# Patient Record
Sex: Female | Born: 1985 | Race: Black or African American | Hispanic: No | Marital: Married | Smoking: Never smoker
Health system: Southern US, Community
[De-identification: ages and names within clinical notes are randomized; demographics above are authoritative.]

## PROBLEM LIST (undated history)

## (undated) DIAGNOSIS — K219 Gastro-esophageal reflux disease without esophagitis: Secondary | ICD-10-CM

## (undated) DIAGNOSIS — Z800 Family history of malignant neoplasm of digestive organs: Secondary | ICD-10-CM

## (undated) DIAGNOSIS — Z1509 Genetic susceptibility to other malignant neoplasm: Secondary | ICD-10-CM

## (undated) DIAGNOSIS — T7840XA Allergy, unspecified, initial encounter: Secondary | ICD-10-CM

## (undated) DIAGNOSIS — J45909 Unspecified asthma, uncomplicated: Secondary | ICD-10-CM

## (undated) DIAGNOSIS — R519 Headache, unspecified: Secondary | ICD-10-CM

## (undated) DIAGNOSIS — Z9189 Other specified personal risk factors, not elsewhere classified: Secondary | ICD-10-CM

## (undated) HISTORY — DX: Allergy, unspecified, initial encounter: T78.40XA

## (undated) HISTORY — DX: Genetic susceptibility to other malignant neoplasm: Z15.09

## (undated) HISTORY — DX: Family history of malignant neoplasm of digestive organs: Z80.0

## (undated) HISTORY — DX: Unspecified asthma, uncomplicated: J45.909

## (undated) HISTORY — PX: DILATION AND CURETTAGE OF UTERUS: SHX78

## (undated) HISTORY — DX: Gastro-esophageal reflux disease without esophagitis: K21.9

---
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
## 2004-08-01 ENCOUNTER — Inpatient Hospital Stay (HOSPITAL_COMMUNITY): Admission: AD | Admit: 2004-08-01 | Discharge: 2004-08-01 | Payer: Self-pay | Admitting: Obstetrics

## 2004-08-03 ENCOUNTER — Encounter (INDEPENDENT_AMBULATORY_CARE_PROVIDER_SITE_OTHER): Payer: Self-pay | Admitting: Specialist

## 2004-08-03 ENCOUNTER — Ambulatory Visit (HOSPITAL_COMMUNITY): Admission: RE | Admit: 2004-08-03 | Discharge: 2004-08-03 | Payer: Self-pay | Admitting: Obstetrics

## 2004-08-12 ENCOUNTER — Inpatient Hospital Stay (HOSPITAL_COMMUNITY): Admission: AD | Admit: 2004-08-12 | Discharge: 2004-08-12 | Payer: Self-pay | Admitting: Obstetrics

## 2004-11-07 ENCOUNTER — Emergency Department (HOSPITAL_COMMUNITY): Admission: EM | Admit: 2004-11-07 | Discharge: 2004-11-07 | Payer: Self-pay | Admitting: Emergency Medicine

## 2005-04-12 ENCOUNTER — Emergency Department (HOSPITAL_COMMUNITY): Admission: EM | Admit: 2005-04-12 | Discharge: 2005-04-12 | Payer: Self-pay | Admitting: Emergency Medicine

## 2005-08-09 ENCOUNTER — Inpatient Hospital Stay (HOSPITAL_COMMUNITY): Admission: AD | Admit: 2005-08-09 | Discharge: 2005-08-09 | Payer: Self-pay | Admitting: Obstetrics

## 2006-01-29 ENCOUNTER — Emergency Department (HOSPITAL_COMMUNITY): Admission: EM | Admit: 2006-01-29 | Discharge: 2006-01-29 | Payer: Self-pay | Admitting: *Deleted

## 2006-02-21 ENCOUNTER — Emergency Department (HOSPITAL_COMMUNITY): Admission: EM | Admit: 2006-02-21 | Discharge: 2006-02-22 | Payer: Self-pay | Admitting: Emergency Medicine

## 2006-03-02 ENCOUNTER — Ambulatory Visit (HOSPITAL_COMMUNITY): Admission: RE | Admit: 2006-03-02 | Discharge: 2006-03-02 | Payer: Self-pay | Admitting: Obstetrics

## 2007-07-05 ENCOUNTER — Inpatient Hospital Stay (HOSPITAL_COMMUNITY): Admission: AD | Admit: 2007-07-05 | Discharge: 2007-07-05 | Payer: Self-pay | Admitting: Obstetrics and Gynecology

## 2009-10-07 ENCOUNTER — Emergency Department (HOSPITAL_COMMUNITY): Admission: EM | Admit: 2009-10-07 | Discharge: 2009-10-08 | Payer: Self-pay | Admitting: Emergency Medicine

## 2010-09-19 ENCOUNTER — Emergency Department (HOSPITAL_COMMUNITY)
Admission: EM | Admit: 2010-09-19 | Discharge: 2010-09-19 | Payer: Self-pay | Source: Home / Self Care | Admitting: Emergency Medicine

## 2010-09-21 LAB — POCT I-STAT, CHEM 8
BUN: 13 mg/dL (ref 6–23)
Calcium, Ion: 1.18 mmol/L (ref 1.12–1.32)
Chloride: 105 meq/L (ref 96–112)
Creatinine, Ser: 0.9 mg/dL (ref 0.4–1.2)
Glucose, Bld: 78 mg/dL (ref 70–99)
HCT: 42 % (ref 36.0–46.0)
Hemoglobin: 14.3 g/dL (ref 12.0–15.0)
Potassium: 3.6 meq/L (ref 3.5–5.1)
Sodium: 139 meq/L (ref 135–145)
TCO2: 24 mmol/L (ref 0–100)

## 2010-09-21 LAB — POCT PREGNANCY, URINE: Preg Test, Ur: NEGATIVE

## 2010-09-21 LAB — URINALYSIS, ROUTINE W REFLEX MICROSCOPIC
Hgb urine dipstick: NEGATIVE
Ketones, ur: 40 mg/dL — AB
Nitrite: NEGATIVE
Protein, ur: NEGATIVE mg/dL
Specific Gravity, Urine: 1.031 — ABNORMAL HIGH (ref 1.005–1.030)
Urine Glucose, Fasting: NEGATIVE mg/dL
Urobilinogen, UA: 1 mg/dL (ref 0.0–1.0)
pH: 5.5 (ref 5.0–8.0)

## 2010-11-23 LAB — RAPID STREP SCREEN (MED CTR MEBANE ONLY): Streptococcus, Group A Screen (Direct): NEGATIVE

## 2011-01-20 NOTE — Op Note (Signed)
NAME:  Amy Reed, Amy Reed              ACCOUNT NO.:  [id number]   MEDICAL RECORD NO.:  [id number]          PATIENT TYPE:  AMB   LOCATION:  SDC                           FACILITY:  [facility]   PHYSICIAN:  Kathreen Cosier, M.D.DATE OF BIRTH:  [birth year]   DATE OF PROCEDURE:  08/03/2004  DATE OF DISCHARGE:                                 OPERATIVE REPORT   PREOPERATIVE DIAGNOSIS:  Intrauterine fetal demise at 7.5 weeks.   PROCEDURE:  Dilation and evacuation.   Using MAC, with the patient in the lithotomy position, perineum and vagina  prepped and draped.  Bladder emptied with a straight catheter.  Bimanual  exam revealed uterus to be 8 weeks size.  Weighted speculum placed in the  vagina.  Anterior lip of the cervix was grasped with a tenaculum.  Cervix  was injected with 8 cc of 1% Xylocaine at 3 and 9 o'clock.  Cervix dilated  to a #27 Shawnie Pons.  A #7 suction was used to aspirate the uterine contents  until the cavity was clean.  The patient tolerated the procedure well and  was taken to the recovery room in good condition.      BAM/MEDQ  D:  08/03/2004  T:  08/03/2004  Job:  [id number]

## 2011-01-20 NOTE — Op Note (Signed)
NAME:  Amy Reed, Amy Reed              ACCOUNT NO.:  [id number]   MEDICAL RECORD NO.:  [id number]          PATIENT TYPE:  AMB   LOCATION:  SDC                           FACILITY:  [facility]   PHYSICIAN:  Charles A. Clearance Coots, M.D.DATE OF BIRTH:  19-Jul-1986   DATE OF PROCEDURE:  03/02/2006  DATE OF DISCHARGE:                                 OPERATIVE REPORT   PREOPERATIVE DIAGNOSIS:  Genital warts.   POSTOPERATIVE DIAGNOSIS:  Genital warts.   PROCEDURE:  Laser vaporization of genital warts.   SURGEON:  Charles A. Clearance Coots, M.D.   ANESTHESIA:  General.   ESTIMATED BLOOD LOSS:  Negligible.   COMPLICATIONS:  None.   SPECIMEN:  None.   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   OPERATION:  The patient was brought to the operating room and after  satisfactory general endotracheal anesthesia, the patient was brought up in  stirrups and the vagina was prepped and draped in usual sterile fashion.  The areas of genital warts were then vaporized at 12-15 watts laser power  until the epidermis was reached and then the base of the vaporized areas  were coagulated at a power of 7 watts to obtain good hemostasis.  There was  no active bleeding at the conclusion of procedure.  All instruments were  retired.  The patient tolerated the procedure well and was transported to  the recovery room in satisfactory condition.      Charles A. Clearance Coots, M.D.  Electronically Signed     CAH/MEDQ  D:  03/02/2006  T:  03/02/2006  Job:  [id number]   cc:   Roseanna Rainbow, M.D.  Fax: [phone]

## 2011-06-14 LAB — URINALYSIS, ROUTINE W REFLEX MICROSCOPIC
Bilirubin Urine: NEGATIVE
Glucose, UA: NEGATIVE
Hgb urine dipstick: NEGATIVE
Ketones, ur: NEGATIVE
Nitrite: NEGATIVE
Protein, ur: NEGATIVE
Specific Gravity, Urine: 1.02
Urobilinogen, UA: 1
pH: 8.5 — ABNORMAL HIGH

## 2011-06-14 LAB — GC/CHLAMYDIA PROBE AMP, GENITAL
Chlamydia, DNA Probe: NEGATIVE
GC Probe Amp, Genital: NEGATIVE

## 2011-06-14 LAB — WET PREP, GENITAL
Clue Cells Wet Prep HPF POC: NONE SEEN
Trich, Wet Prep: NONE SEEN
Yeast Wet Prep HPF POC: NONE SEEN

## 2012-01-09 ENCOUNTER — Encounter (HOSPITAL_COMMUNITY): Payer: Self-pay | Admitting: *Deleted

## 2012-01-09 ENCOUNTER — Emergency Department (HOSPITAL_COMMUNITY)
Admission: EM | Admit: 2012-01-09 | Discharge: 2012-01-10 | Disposition: A | Discharge status: Home / Self Care | Attending: Emergency Medicine | Admitting: Emergency Medicine

## 2012-01-09 DIAGNOSIS — E860 Dehydration: Secondary | ICD-10-CM

## 2012-01-09 DIAGNOSIS — R51 Headache: Secondary | ICD-10-CM | POA: Insufficient documentation

## 2012-01-09 DIAGNOSIS — R112 Nausea with vomiting, unspecified: Secondary | ICD-10-CM

## 2012-01-09 DIAGNOSIS — R197 Diarrhea, unspecified: Secondary | ICD-10-CM | POA: Insufficient documentation

## 2012-01-09 DIAGNOSIS — Z7982 Long term (current) use of aspirin: Secondary | ICD-10-CM | POA: Insufficient documentation

## 2012-01-09 DIAGNOSIS — Z79899 Other long term (current) drug therapy: Secondary | ICD-10-CM | POA: Insufficient documentation

## 2012-01-09 LAB — POCT I-STAT, CHEM 8
BUN: 19 mg/dL (ref 6–23)
Calcium, Ion: 1.18 mmol/L (ref 1.12–1.32)
Chloride: 106 mEq/L (ref 96–112)
Creatinine, Ser: 0.8 mg/dL (ref 0.50–1.10)
Glucose, Bld: 86 mg/dL (ref 70–99)
HCT: 49 % — ABNORMAL HIGH (ref 36.0–46.0)
Hemoglobin: 16.7 g/dL — ABNORMAL HIGH (ref 12.0–15.0)
Potassium: 3.7 mEq/L (ref 3.5–5.1)
Sodium: 143 mEq/L (ref 135–145)
TCO2: 26 mmol/L (ref 0–100)

## 2012-01-09 LAB — HCG, SERUM, QUALITATIVE: Preg, Serum: NEGATIVE

## 2012-01-09 LAB — PREGNANCY, URINE: Preg Test, Ur: NEGATIVE

## 2012-01-09 MED ORDER — SODIUM CHLORIDE 0.9 % IV BOLUS (SEPSIS)
1000.0000 mL | Freq: Once | INTRAVENOUS | Status: AC
  Administered 2012-01-09: 1000 mL via INTRAVENOUS

## 2012-01-09 MED ORDER — ONDANSETRON HCL 4 MG/2ML IJ SOLN
4.0000 mg | Freq: Once | INTRAMUSCULAR | Status: AC
  Administered 2012-01-09: 4 mg via INTRAVENOUS
  Filled 2012-01-09: qty 2

## 2012-01-09 MED ORDER — PROMETHAZINE HCL 25 MG PO TABS: 25.0000 mg | ORAL_TABLET | Freq: Four times a day (QID) | ORAL | Status: DC | PRN

## 2012-01-09 NOTE — ED Provider Notes (Signed)
History     CSN: [id number]  Arrival date & time 01/09/12  2023   First MD Initiated Contact with Patient 01/09/12 2235      Chief Complaint  Patient presents with  . N/V/D     (Consider location/radiation/quality/duration/timing/severity/associated sxs/prior treatment) The history is provided by the patient.   the patient reports developing nausea vomiting and diarrhea earlier today.  She reports mild headache.  She has no neck stiffness.  She denies abdominal pain.  She has no chest pain shortness of breath.  She denies melena or hematochezia.  She has no fevers or chills.  She denies dysuria.  She's had no recent sick contacts.  Nothing worsens her symptoms.  Nothing improves her symptoms.  Her symptoms are constant.  History reviewed. No pertinent past medical history.  History reviewed. No pertinent past surgical history.  History reviewed. No pertinent family history.  History  Substance Use Topics  . Smoking status: Not on file  . Smokeless tobacco: Not on file  . Alcohol Use: Not on file    OB History    Grav Para Term Preterm Abortions TAB SAB Ect Mult Living                  Review of Systems  All other systems reviewed and are negative.    Allergies  Review of patient's allergies indicates no known allergies.  Home Medications   Current Outpatient Rx  Name Route Sig Dispense Refill  . ASPIRIN EC 325 MG PO TBEC Oral Take 325 mg by mouth daily.    Marland Kitchen BISMUTH SUBSALICYLATE 262 MG PO CHEW Oral Chew 524 mg by mouth as needed. Stomach pain    . PROMETHAZINE HCL 25 MG PO TABS Oral Take 1 tablet (25 mg total) by mouth every 6 (six) hours as needed for nausea. 15 tablet 0    BP 129/67  Pulse 83  Temp(Src) 98.5 F (36.9 C) (Oral)  Resp 20  SpO2 100%  Physical Exam  Nursing note and vitals reviewed. Constitutional: She is oriented to person, place, and time. She appears well-developed and well-nourished. No distress.  HENT:  Head: Normocephalic and  atraumatic.       Mucous membranes dry  Eyes: EOM are normal.  Neck: Normal range of motion.  Cardiovascular: Normal rate, regular rhythm and normal heart sounds.   Pulmonary/Chest: Effort normal and breath sounds normal.  Abdominal: Soft. She exhibits no distension. There is no tenderness.  Musculoskeletal: Normal range of motion.  Neurological: She is alert and oriented to person, place, and time.  Skin: Skin is warm and dry.  Psychiatric: She has a normal mood and affect. Judgment normal.    ED Course  Procedures (including critical care time)  Labs Reviewed  POCT I-STAT, CHEM 8 - Abnormal; Notable for the following:    Hemoglobin 16.7 (*)    HCT 49.0 (*)    All other components within normal limits  PREGNANCY, URINE  HCG, SERUM, QUALITATIVE   No results found.   1. Nausea vomiting and diarrhea   2. Dehydration       MDM  Isolated nausea vomiting and diarrhea with early dehydration.  IV fluids and Zofran given.  The patient feels much better at this time.  Her abdomen is benign.  Discharge home in good condition.        Lyanne Co, MD 01/09/12 [phone]

## 2012-01-09 NOTE — Discharge Instructions (Signed)
Dehydration, Adult Dehydration is when you lose more fluids from the body than you take in. Vital organs like the kidneys, brain, and heart cannot function without a proper amount of fluids and salt. Any loss of fluids from the body can cause dehydration.  CAUSES   Vomiting.   Diarrhea.   Excessive sweating.   Excessive urine output.   Fever.  SYMPTOMS  Mild dehydration  Thirst.   Dry lips.   Slightly dry mouth.  Moderate dehydration  Very dry mouth.   Sunken eyes.   Skin does not bounce back quickly when lightly pinched and released.   Dark urine and decreased urine production.   Decreased tear production.   Headache.  Severe dehydration  Very dry mouth.   Extreme thirst.   Rapid, weak pulse (more than 100 beats per minute at rest).   Cold hands and feet.   Not able to sweat in spite of heat and temperature.   Rapid breathing.   Blue lips.   Confusion and lethargy.   Difficulty being awakened.   Minimal urine production.   No tears.  DIAGNOSIS  Your caregiver will diagnose dehydration based on your symptoms and your exam. Blood and urine tests will help confirm the diagnosis. The diagnostic evaluation should also identify the cause of dehydration. TREATMENT  Treatment of mild or moderate dehydration can often be done at home by increasing the amount of fluids that you drink. It is best to drink small amounts of fluid more often. Drinking too much at one time can make vomiting worse. Refer to the home care instructions below. Severe dehydration needs to be treated at the hospital where you will probably be given intravenous (IV) fluids that contain water and electrolytes. HOME CARE INSTRUCTIONS   Ask your caregiver about specific rehydration instructions.   Drink enough fluids to keep your urine clear or pale yellow.   Drink small amounts frequently if you have nausea and vomiting.   Eat as you normally do.   Avoid:   Foods or drinks high in  sugar.   Carbonated drinks.   Juice.   Extremely hot or cold fluids.   Drinks with caffeine.   Fatty, greasy foods.   Alcohol.   Tobacco.   Overeating.   Gelatin desserts.   Wash your hands well to avoid spreading bacteria and viruses.   Only take over-the-counter or prescription medicines for pain, discomfort, or fever as directed by your caregiver.   Ask your caregiver if you should continue all prescribed and over-the-counter medicines.   Keep all follow-up appointments with your caregiver.  SEEK MEDICAL CARE IF:  You have abdominal pain and it increases or stays in one area (localizes).   You have a rash, stiff neck, or severe headache.   You are irritable, sleepy, or difficult to awaken.   You are weak, dizzy, or extremely thirsty.  SEEK IMMEDIATE MEDICAL CARE IF:   You are unable to keep fluids down or you get worse despite treatment.   You have frequent episodes of vomiting or diarrhea.   You have blood or green matter (bile) in your vomit.   You have blood in your stool or your stool looks black and tarry.   You have not urinated in 6 to 8 hours, or you have only urinated a small amount of very dark urine.   You have a fever.   You faint.  MAKE SURE YOU:   Understand these instructions.   Will watch your condition.     Will get help right away if you are not doing well or get worse.  Document Released: 08/21/2005 Document Revised: 08/10/2011 Document Reviewed: 04/10/2011 [employer] Patient Information 2012 [employer]. 

## 2012-01-09 NOTE — ED Notes (Signed)
Pt in c/o n/v/d since earlier today and headache

## 2012-06-30 ENCOUNTER — Ambulatory Visit (INDEPENDENT_AMBULATORY_CARE_PROVIDER_SITE_OTHER): Admitting: Physician Assistant

## 2012-06-30 VITALS — BP 122/80 | HR 103 | Temp 100.2°F | Resp 16 | Ht 67.0 in | Wt 112.0 lb

## 2012-06-30 DIAGNOSIS — R509 Fever, unspecified: Secondary | ICD-10-CM

## 2012-06-30 DIAGNOSIS — J029 Acute pharyngitis, unspecified: Secondary | ICD-10-CM

## 2012-06-30 LAB — POCT CBC
Granulocyte percent: 79.9 %G (ref 37–80)
HCT, POC: 43.9 % (ref 37.7–47.9)
Hemoglobin: 13.6 g/dL (ref 12.2–16.2)
Lymph, poc: 1.7 (ref 0.6–3.4)
MCH, POC: 31.1 pg (ref 27–31.2)
MCHC: 31 g/dL — AB (ref 31.8–35.4)
MCV: 100.4 fL — AB (ref 80–97)
MID (cbc): 0.7 (ref 0–0.9)
MPV: 10.1 fL (ref 0–99.8)
POC Granulocyte: 9.3 — AB (ref 2–6.9)
POC LYMPH PERCENT: 14.3 %L (ref 10–50)
POC MID %: 5.8 %M (ref 0–12)
Platelet Count, POC: 175 10*3/uL (ref 142–424)
RBC: 4.37 M/uL (ref 4.04–5.48)
RDW, POC: 13.2 %
WBC: 11.6 10*3/uL — AB (ref 4.6–10.2)

## 2012-06-30 LAB — POCT INFLUENZA A/B
Influenza A, POC: NEGATIVE
Influenza B, POC: NEGATIVE

## 2012-06-30 MED ORDER — AMOXICILLIN 875 MG PO TABS: 875.0000 mg | ORAL_TABLET | Freq: Two times a day (BID) | ORAL | Status: DC

## 2012-06-30 MED ORDER — HYDROCODONE-HOMATROPINE 5-1.5 MG/5ML PO SYRP: 5.0000 mL | ORAL_SOLUTION | Freq: Three times a day (TID) | ORAL | Status: DC | PRN

## 2012-06-30 NOTE — Progress Notes (Signed)
  Subjective:    Patient ID: Amy Reed, female    DOB: 25-Aug-1986, 26 y.o.   MRN: [id number]  HPI 26 year old female presents with acute onset of sore throat, fever, chills, body aches, nasal congestion, and dry cough.  No know ill contacts.  Has been taking tylenol prn fever. Denies abdominal pain, nausea, vomiting, or headache.  She is an otherwise healthy female.     Review of Systems  Constitutional: Positive for fever and chills.  HENT: Positive for congestion, sore throat and rhinorrhea. Negative for ear pain.   Respiratory: Positive for cough. Negative for shortness of breath and wheezing.   Gastrointestinal: Negative for nausea, vomiting and abdominal pain.  All other systems reviewed and are negative.       Objective:   Physical Exam  Constitutional: She is oriented to person, place, and time. She appears well-developed and well-nourished.  HENT:  Head: Normocephalic and atraumatic.  Right Ear: External ear normal.  Left Ear: External ear normal.  Eyes: Conjunctivae normal are normal.  Neck: Normal range of motion.  Cardiovascular: Normal rate, regular rhythm and normal heart sounds.   Pulmonary/Chest: Effort normal and breath sounds normal.  Lymphadenopathy:    She has no cervical adenopathy.  Neurological: She is alert and oriented to person, place, and time.  Psychiatric: She has a normal mood and affect. Her behavior is normal. Judgment and thought content normal.     Results for orders placed in visit on 06/30/12  POCT INFLUENZA A/B      Component Value Range   Influenza A, POC Negative     Influenza B, POC Negative    POCT CBC      Component Value Range   WBC 11.6 (*) 4.6 - 10.2 K/uL   Lymph, poc 1.7  0.6 - 3.4   POC LYMPH PERCENT 14.3  10 - 50 %L   MID (cbc) 0.7  0 - 0.9   POC MID % 5.8  0 - 12 %M   POC Granulocyte 9.3 (*) 2 - 6.9   Granulocyte percent 79.9  37 - 80 %G   RBC 4.37  4.04 - 5.48 M/uL   Hemoglobin 13.6  12.2 - 16.2 g/dL   HCT, POC  40.9  81.1 - 47.9 %   MCV 100.4 (*) 80 - 97 fL   MCH, POC 31.1  27 - 31.2 pg   MCHC 31.0 (*) 31.8 - 35.4 g/dL   RDW, POC 91.4     Platelet Count, POC 175  142 - 424 K/uL   MPV 10.1  0 - 99.8 fL        Assessment & Plan:   1. Fever  POCT Influenza A/B, POCT CBC  2. Acute pharyngitis    Amoxicillin 875 mg bid x 10 days Continue ibuprofen or tylenol as needed for fever Hycodan prn cough Follow up if symptoms worsen or fail to improve.

## 2012-11-27 ENCOUNTER — Emergency Department (HOSPITAL_COMMUNITY)
Admission: EM | Admit: 2012-11-27 | Discharge: 2012-11-28 | Disposition: A | Discharge status: Home / Self Care | Attending: Emergency Medicine | Admitting: Emergency Medicine

## 2012-11-27 ENCOUNTER — Emergency Department (HOSPITAL_COMMUNITY)

## 2012-11-27 ENCOUNTER — Encounter (HOSPITAL_COMMUNITY): Payer: Self-pay | Admitting: Family Medicine

## 2012-11-27 DIAGNOSIS — R0602 Shortness of breath: Secondary | ICD-10-CM | POA: Insufficient documentation

## 2012-11-27 DIAGNOSIS — R0789 Other chest pain: Secondary | ICD-10-CM | POA: Insufficient documentation

## 2012-11-27 LAB — URINE MICROSCOPIC-ADD ON

## 2012-11-27 LAB — CBC WITH DIFFERENTIAL/PLATELET
Basophils Absolute: 0 10*3/uL (ref 0.0–0.1)
Basophils Relative: 0 % (ref 0–1)
Eosinophils Absolute: 0 10*3/uL (ref 0.0–0.7)
Eosinophils Relative: 1 % (ref 0–5)
HCT: 39.1 % (ref 36.0–46.0)
Hemoglobin: 13.5 g/dL (ref 12.0–15.0)
Lymphocytes Relative: 49 % — ABNORMAL HIGH (ref 12–46)
Lymphs Abs: 2.9 10*3/uL (ref 0.7–4.0)
MCH: 32.5 pg (ref 26.0–34.0)
MCHC: 34.5 g/dL (ref 30.0–36.0)
MCV: 94.2 fL (ref 78.0–100.0)
Monocytes Absolute: 0.4 10*3/uL (ref 0.1–1.0)
Monocytes Relative: 6 % (ref 3–12)
Neutro Abs: 2.7 10*3/uL (ref 1.7–7.7)
Neutrophils Relative %: 44 % (ref 43–77)
Platelets: 177 10*3/uL (ref 150–400)
RBC: 4.15 MIL/uL (ref 3.87–5.11)
RDW: 12.1 % (ref 11.5–15.5)
WBC: 6 10*3/uL (ref 4.0–10.5)

## 2012-11-27 LAB — URINALYSIS, ROUTINE W REFLEX MICROSCOPIC
Bilirubin Urine: NEGATIVE
Glucose, UA: NEGATIVE mg/dL
Hgb urine dipstick: NEGATIVE
Ketones, ur: NEGATIVE mg/dL
Nitrite: NEGATIVE
Protein, ur: NEGATIVE mg/dL
Specific Gravity, Urine: 1.024 (ref 1.005–1.030)
Urobilinogen, UA: 1 mg/dL (ref 0.0–1.0)
pH: 6.5 (ref 5.0–8.0)

## 2012-11-27 MED ORDER — KETOROLAC TROMETHAMINE 60 MG/2ML IM SOLN
60.0000 mg | Freq: Once | INTRAMUSCULAR | Status: DC
  Filled 2012-11-27: qty 2

## 2012-11-27 NOTE — ED Notes (Signed)
Patient states that she was sitting in a car and she experienced a tightness under her left breast that lasted for approx 10 mins. States that since that time she has soreness under her left breast and reports that she has pain with deep inspiration.

## 2012-11-27 NOTE — ED Provider Notes (Signed)
History     CSN: [id number]  Arrival date & time 11/27/12  2136   First MD Initiated Contact with Patient 11/27/12 2252      Chief Complaint  Patient presents with  . Chest Pain    (Consider location/radiation/quality/duration/timing/severity/associated sxs/prior treatment) HPI Comments: Patient presents with, episode of chest tightness underneath her left breast that came on at rest. It has eased off at this time and is more sore now. She describes a squeezing pain that did not radiate. She's had this pain intermittently in the past without diagnosis. She endorses pain with inspiration at this time. No cough, fever, chills, congestion or abdominal pain. No leg pain or swelling. No recent travel. No birth control use. Denies any hemoptysis. Pain is worse with inspiration and better with rest. Denies any cardiac risk factors. Does not smoke.  The history is provided by the patient.    History reviewed. No pertinent past medical history.  Past Surgical History  Procedure Laterality Date  . Dilation and curettage of uterus      No family history on file.  History  Substance Use Topics  . Smoking status: Never Smoker   . Smokeless tobacco: Never Used  . Alcohol Use: No    OB History   Grav Para Term Preterm Abortions TAB SAB Ect Mult Living                  Review of Systems  Constitutional: Negative for diaphoresis, activity change, appetite change and fatigue.  HENT: Negative for congestion and rhinorrhea.   Respiratory: Positive for chest tightness and shortness of breath. Negative for cough.   Cardiovascular: Positive for chest pain.  Gastrointestinal: Negative for nausea, vomiting and abdominal pain.  Genitourinary: Negative for dysuria, vaginal bleeding and vaginal discharge.  Musculoskeletal: Negative for back pain.  Skin: Negative for rash.  Neurological: Negative for dizziness, weakness and headaches.  A complete 10 system review of systems was obtained and  all systems are negative except as noted in the HPI and PMH.    Allergies  Review of patient's allergies indicates no known allergies.  Home Medications   Current Outpatient Rx  Name  Route  Sig  Dispense  Refill  . ibuprofen (ADVIL,MOTRIN) 800 MG tablet   Oral   Take 1 tablet (800 mg total) by mouth 3 (three) times daily.   21 tablet   0   . EXPIRED: promethazine (PHENERGAN) 25 MG tablet   Oral   Take 1 tablet (25 mg total) by mouth every 6 (six) hours as needed for nausea.   15 tablet   0     BP 136/79  Pulse 79  Temp(Src) 98.3 F (36.8 C) (Oral)  Resp 18  Ht 5\' 7"  (1.702 m)  Wt 110 lb (49.896 kg)  BMI 17.22 kg/m2  SpO2 100%  LMP 11/04/2012  Physical Exam  Constitutional: She is oriented to person, place, and time. She appears well-developed and well-nourished. No distress.  HENT:  Head: Normocephalic and atraumatic.  Mouth/Throat: Oropharynx is clear and moist. No oropharyngeal exudate.  Eyes: Conjunctivae are normal. Pupils are equal, round, and reactive to light.  Neck: Normal range of motion. Neck supple.  Cardiovascular: Normal rate, regular rhythm and normal heart sounds.   No murmur heard. Pulmonary/Chest: Breath sounds normal. No respiratory distress. She exhibits tenderness.    TTP without rash Chaperone present  Abdominal: Soft. There is no tenderness. There is no rebound and no guarding.  Musculoskeletal: Normal range of motion.  She exhibits no edema and no tenderness.  Neurological: She is alert and oriented to person, place, and time. No cranial nerve deficit. She exhibits normal muscle tone. Coordination normal.  Skin: Skin is warm.    ED Course  Procedures (including critical care time)  Labs Reviewed  URINALYSIS, ROUTINE W REFLEX MICROSCOPIC - Abnormal; Notable for the following:    APPearance CLOUDY (*)    Leukocytes, UA MODERATE (*)    All other components within normal limits  CBC WITH DIFFERENTIAL - Abnormal; Notable for the  following:    Lymphocytes Relative 49 (*)    All other components within normal limits  COMPREHENSIVE METABOLIC PANEL - Abnormal; Notable for the following:    Total Bilirubin 0.2 (*)    All other components within normal limits  URINE MICROSCOPIC-ADD ON - Abnormal; Notable for the following:    Bacteria, UA FEW (*)    All other components within normal limits  URINE CULTURE  TROPONIN I  D-DIMER, QUANTITATIVE   Dg Chest 2 View  11/27/2012  *RADIOLOGY REPORT*  Clinical Data: Short of breath.  CHEST - 2 VIEW  Comparison: None.  Findings:  Cardiopericardial silhouette within normal limits. Mediastinal contours normal. Trachea midline.  No airspace disease or effusion. Monitoring leads are projected over the chest.  IMPRESSION: Negative.   Original Report Authenticated By: Andreas Newport, M.D.      1. Atypical chest pain       MDM  10 minute episode of squeezing chest pain that has since resolved. Now with pain on inspiration. Vital stable, no distress, EKG nonischemic. No risk factors for PE.  EKG nonischemic. Troponin and D-dimer negative.  Suspect musculoskeletal chest pain.  Return precautions discussed.   Date: 11/27/2012  Rate: 69  Rhythm: normal sinus rhythm  QRS Axis: normal  Intervals: normal  ST/T Wave abnormalities: normal  Conduction Disutrbances:none  Narrative Interpretation:   Old EKG Reviewed: none available      Glynn Octave, MD 11/28/12 0451

## 2012-11-28 LAB — COMPREHENSIVE METABOLIC PANEL
ALT: 8 U/L (ref 0–35)
AST: 14 U/L (ref 0–37)
Albumin: 4 g/dL (ref 3.5–5.2)
Alkaline Phosphatase: 53 U/L (ref 39–117)
BUN: 15 mg/dL (ref 6–23)
CO2: 27 mEq/L (ref 19–32)
Calcium: 9.5 mg/dL (ref 8.4–10.5)
Chloride: 101 mEq/L (ref 96–112)
Creatinine, Ser: 0.75 mg/dL (ref 0.50–1.10)
GFR calc Af Amer: >90 mL/min (ref >90)
GFR calc non Af Amer: >90 mL/min (ref >90)
Glucose, Bld: 86 mg/dL (ref 70–99)
Potassium: 4 mEq/L (ref 3.5–5.1)
Sodium: 137 mEq/L (ref 135–145)
Total Bilirubin: 0.2 mg/dL — ABNORMAL LOW (ref 0.3–1.2)
Total Protein: 7.4 g/dL (ref 6.0–8.3)

## 2012-11-28 LAB — D-DIMER, QUANTITATIVE (NOT AT ARMC): D-Dimer, Quant: <0.27 ug/mL-FEU (ref 0.00–0.48)

## 2012-11-28 LAB — POCT PREGNANCY, URINE: Preg Test, Ur: NEGATIVE

## 2012-11-28 LAB — TROPONIN I: Troponin I: <0.3 ng/mL (ref <0.30)

## 2012-11-28 MED ORDER — IBUPROFEN 800 MG PO TABS: 800.0000 mg | ORAL_TABLET | Freq: Three times a day (TID) | ORAL | Status: DC

## 2012-11-29 LAB — URINE CULTURE
Colony Count: NO GROWTH
Culture: NO GROWTH

## 2014-01-06 ENCOUNTER — Encounter: Payer: Self-pay | Admitting: Obstetrics

## 2014-01-06 ENCOUNTER — Ambulatory Visit (INDEPENDENT_AMBULATORY_CARE_PROVIDER_SITE_OTHER): Admitting: Obstetrics

## 2014-01-06 VITALS — BP 118/74 | HR 88 | Temp 98.6°F | Wt 126.0 lb

## 2014-01-06 DIAGNOSIS — N899 Noninflammatory disorder of vagina, unspecified: Secondary | ICD-10-CM

## 2014-01-06 DIAGNOSIS — B373 Candidiasis of vulva and vagina: Secondary | ICD-10-CM

## 2014-01-06 DIAGNOSIS — N760 Acute vaginitis: Secondary | ICD-10-CM

## 2014-01-06 DIAGNOSIS — N898 Other specified noninflammatory disorders of vagina: Secondary | ICD-10-CM

## 2014-01-06 DIAGNOSIS — A499 Bacterial infection, unspecified: Secondary | ICD-10-CM

## 2014-01-06 DIAGNOSIS — B9689 Other specified bacterial agents as the cause of diseases classified elsewhere: Secondary | ICD-10-CM | POA: Insufficient documentation

## 2014-01-06 DIAGNOSIS — Z0000 Encounter for general adult medical examination without abnormal findings: Secondary | ICD-10-CM

## 2014-01-06 DIAGNOSIS — B3731 Acute candidiasis of vulva and vagina: Secondary | ICD-10-CM

## 2014-01-06 DIAGNOSIS — Z01419 Encounter for gynecological examination (general) (routine) without abnormal findings: Secondary | ICD-10-CM

## 2014-01-06 MED ORDER — METRONIDAZOLE 500 MG PO TABS: 500.0000 mg | ORAL_TABLET | Freq: Two times a day (BID) | ORAL | Status: DC

## 2014-01-06 MED ORDER — FLUCONAZOLE 150 MG PO TABS: 150.0000 mg | ORAL_TABLET | Freq: Once | ORAL | Status: DC

## 2014-01-06 NOTE — Progress Notes (Signed)
Subjective:     Amy Reed is a 28 y.o. female here for a routine exam.  Current complaints:   Vaginal irritation.    Personal health questionnaire:  Is patient Ashkenazi Jewish, have a family history of breast and/or ovarian cancer: no Is there a family history of uterine cancer diagnosed at age < 5850, gastrointestinal cancer, urinary tract cancer, family member who is a Personnel officerLynch syndrome-associated carrier: no Is the patient overweight and hypertensive, family history of diabetes, personal history of gestational diabetes or PCOS: no Is patient over 2855, have PCOS,  family history of premature CHD under age 29, diabetes, smoke, have hypertension or peripheral artery disease:  no  The HPI was reviewed and explored in further detail by the provider. Gynecologic History Patient's last menstrual period was 12/14/2013. Contraception: none Last Pap: Unknown. Results were: abnormal Last mammogram: n/a. Results were: n/a  Obstetric History OB History  No data available    History reviewed. No pertinent past medical history.  Past Surgical History  Procedure Laterality Date  . Dilation and curettage of uterus      Current outpatient prescriptions:fluconazole (DIFLUCAN) 150 MG tablet, Take 1 tablet (150 mg total) by mouth once., Disp: 1 tablet, Rfl: 2;  metroNIDAZOLE (FLAGYL) 500 MG tablet, Take 1 tablet (500 mg total) by mouth 2 (two) times daily., Disp: 14 tablet, Rfl: 2;  promethazine (PHENERGAN) 25 MG tablet, Take 1 tablet (25 mg total) by mouth every 6 (six) hours as needed for nausea., Disp: 15 tablet, Rfl: 0 No Known Allergies  History  Substance Use Topics  . Smoking status: Never Smoker   . Smokeless tobacco: Never Used  . Alcohol Use: No    History reviewed. No pertinent family history.    Review of Systems  Constitutional: negative for fatigue and weight loss Respiratory: negative for cough and wheezing Cardiovascular: negative for chest pain, fatigue and  palpitations Gastrointestinal: negative for abdominal pain and change in bowel habits Musculoskeletal:negative for myalgias Neurological: negative for gait problems and tremors Behavioral/Psych: negative for abusive relationship, depression Endocrine: negative for temperature intolerance   Genitourinary:negative for abnormal menstrual periods, genital lesions, hot flashes, sexual problems and vaginal discharge Integument/breast: negative for breast lump, breast tenderness, nipple discharge and skin lesion(s)    Objective:       General:   alert  Skin:   no rash or abnormalities  Lungs:   clear to auscultation bilaterally  Heart:   regular rate and rhythm, S1, S2 normal, no murmur, click, rub or gallop  Breasts:   normal without suspicious masses, skin or nipple changes or axillary nodes  Abdomen:  normal findings: no organomegaly, soft, non-tender and no hernia  Pelvis:  External genitalia: normal general appearance Urinary system: urethral meatus normal and bladder without fullness, nontender Vaginal: normal without tenderness, induration or masses Cervix: normal appearance Adnexa: normal bimanual exam Uterus: anteverted and non-tender, normal size   Lab Review Urine pregnancy test Labs reviewed yes Radiologic studies reviewed no    Assessment:    Healthy female exam.   BV  Candida vulvovaginitis.   Plan:    Education reviewed: safe sex/STD prevention and self breast exams. Follow up in: 1 year. Metronidazole Rx for BV Fluconazole Rx for yeast  Meds ordered this encounter  Medications  . metroNIDAZOLE (FLAGYL) 500 MG tablet    Sig: Take 1 tablet (500 mg total) by mouth 2 (two) times daily.    Dispense:  14 tablet    Refill:  2  .  fluconazole (DIFLUCAN) 150 MG tablet    Sig: Take 1 tablet (150 mg total) by mouth once.    Dispense:  1 tablet    Refill:  2   Orders Placed This Encounter  Procedures  . WET PREP BY MOLECULAR PROBE  . POCT urinalysis dipstick    Need to obtain previous records.  Has H/O abnormal paps. Possible management options include:  Medication and hygiene changes for vaginitis. Follow up as needed.

## 2014-01-07 LAB — PAP IG W/ RFLX HPV ASCU

## 2014-01-07 LAB — WET PREP BY MOLECULAR PROBE
Candida species: NEGATIVE
Gardnerella vaginalis: NEGATIVE
Trichomonas vaginosis: NEGATIVE

## 2014-06-17 ENCOUNTER — Ambulatory Visit (INDEPENDENT_AMBULATORY_CARE_PROVIDER_SITE_OTHER)

## 2014-06-17 ENCOUNTER — Ambulatory Visit (INDEPENDENT_AMBULATORY_CARE_PROVIDER_SITE_OTHER): Admitting: Emergency Medicine

## 2014-06-17 VITALS — BP 100/62 | HR 82 | Temp 98.5°F | Resp 18 | Ht 66.0 in | Wt 124.0 lb

## 2014-06-17 DIAGNOSIS — M25512 Pain in left shoulder: Secondary | ICD-10-CM

## 2014-06-17 DIAGNOSIS — M542 Cervicalgia: Secondary | ICD-10-CM

## 2014-06-17 DIAGNOSIS — S40012A Contusion of left shoulder, initial encounter: Secondary | ICD-10-CM

## 2014-06-17 DIAGNOSIS — S161XXA Strain of muscle, fascia and tendon at neck level, initial encounter: Secondary | ICD-10-CM

## 2014-06-17 MED ORDER — NAPROXEN SODIUM 550 MG PO TABS: 550.0000 mg | ORAL_TABLET | Freq: Two times a day (BID) | ORAL | Status: DC

## 2014-06-17 MED ORDER — CYCLOBENZAPRINE HCL 10 MG PO TABS: 10.0000 mg | ORAL_TABLET | Freq: Three times a day (TID) | ORAL | Status: DC | PRN

## 2014-06-17 NOTE — Patient Instructions (Signed)
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
## 2014-06-22 ENCOUNTER — Ambulatory Visit (INDEPENDENT_AMBULATORY_CARE_PROVIDER_SITE_OTHER): Admitting: Internal Medicine

## 2014-06-22 DIAGNOSIS — S161XXS Strain of muscle, fascia and tendon at neck level, sequela: Secondary | ICD-10-CM

## 2014-06-22 DIAGNOSIS — M25512 Pain in left shoulder: Secondary | ICD-10-CM

## 2014-06-22 DIAGNOSIS — S46912S Strain of unspecified muscle, fascia and tendon at shoulder and upper arm level, left arm, sequela: Secondary | ICD-10-CM
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
## 2014-06-22 NOTE — Patient Instructions (Signed)

## 2014-07-01 ENCOUNTER — Telehealth: Payer: Self-pay

## 2014-07-01 NOTE — Telephone Encounter (Signed)
Patient called and left a message saying she needed records. Returned patient's call and advised her that she will need to come in and fill out a release form. Patient understood.

## 2014-10-11 ENCOUNTER — Ambulatory Visit (INDEPENDENT_AMBULATORY_CARE_PROVIDER_SITE_OTHER): Admitting: Family Medicine

## 2014-10-11 ENCOUNTER — Ambulatory Visit (INDEPENDENT_AMBULATORY_CARE_PROVIDER_SITE_OTHER)

## 2014-10-11 VITALS — BP 100/68 | HR 79 | Temp 98.1°F | Resp 16 | Ht 67.0 in | Wt 128.8 lb

## 2014-10-11 DIAGNOSIS — R0602 Shortness of breath: Secondary | ICD-10-CM

## 2014-10-11 DIAGNOSIS — Z131 Encounter for screening for diabetes mellitus: Secondary | ICD-10-CM

## 2014-10-11 DIAGNOSIS — N644 Mastodynia: Secondary | ICD-10-CM

## 2014-10-11 DIAGNOSIS — R208 Other disturbances of skin sensation: Secondary | ICD-10-CM

## 2014-10-11 LAB — POCT CBC
Granulocyte percent: 51.3 %G (ref 37–80)
HCT, POC: 40.4 % (ref 37.7–47.9)
Hemoglobin: 13.1 g/dL (ref 12.2–16.2)
Lymph, poc: 1.9 (ref 0.6–3.4)
MCH, POC: 32.1 pg — AB (ref 27–31.2)
MCHC: 32.5 g/dL (ref 31.8–35.4)
MCV: 98.6 fL — AB (ref 80–97)
MID (cbc): 0.4 (ref 0–0.9)
MPV: 8.7 fL (ref 0–99.8)
POC Granulocyte: 2.4 (ref 2–6.9)
POC LYMPH PERCENT: 40.4 %L (ref 10–50)
POC MID %: 8.3 %M (ref 0–12)
Platelet Count, POC: 197 10*3/uL (ref 142–424)
RBC: 4.09 M/uL (ref 4.04–5.48)
RDW, POC: 12.9 %
WBC: 4.6 10*3/uL (ref 4.6–10.2)

## 2014-10-11 LAB — COMPLETE METABOLIC PANEL WITH GFR
ALT: 8 U/L (ref 0–35)
AST: 12 U/L (ref 0–37)
Albumin: 4 g/dL (ref 3.5–5.2)
Alkaline Phosphatase: 47 U/L (ref 39–117)
BUN: 9 mg/dL (ref 6–23)
CO2: 27 mEq/L (ref 19–32)
Calcium: 9.7 mg/dL (ref 8.4–10.5)
Chloride: 103 mEq/L (ref 96–112)
Creat: 0.78 mg/dL (ref 0.50–1.10)
GFR, Est African American: >89 mL/min
GFR, Est Non African American: >89 mL/min
Glucose, Bld: 72 mg/dL (ref 70–99)
Potassium: 4.2 mEq/L (ref 3.5–5.3)
Sodium: 136 mEq/L (ref 135–145)
Total Bilirubin: 0.6 mg/dL (ref 0.2–1.2)
Total Protein: 7.1 g/dL (ref 6.0–8.3)

## 2014-10-11 LAB — POCT GLYCOSYLATED HEMOGLOBIN (HGB A1C): Hemoglobin A1C: 4.7

## 2014-10-11 LAB — TSH: TSH: 1.877 u[IU]/mL (ref 0.350–4.500)

## 2014-10-11 NOTE — Progress Notes (Signed)
Chief Complaint:  Chief Complaint  Patient presents with  . lump leg    left 3-4 days  . Leg Pain    x 1 year right  . Breast Pain    nipple pain x 4 days  . Shortness of Breath    x 3-4 days    HPI: Amy Reed is a 29 y.o. female who is here for  1. Left knots on her left leg, intermittent right ankle pain ( sharp for many years) , she also has bilateral anterior lower leg  pain x 4 days LMP 2 weeks ago, she is a lesbian and "can't be pregnant"--although she does have a 40 yr old son , no nipple discharge, she has sore nipplse, this does not happen around her period.  Her menses are typically 5 days long,  q28 days, flow is heavy, not sure if she has clots, uses super plus tampons changes 4x per day, last pap was normal at [facility] Mother had breast nodule, but it was  benign 2. SOB for the last 3-4 days, going up and down stairs. She has never had asthma or wheezing. Also having SOB at rest. She doe snot smoke, she hasnot been exercising, she used to be in the [employer] 6 years , now  Works as a Social worker and stands all day. No CP, palpitations. She has no risk factors of PE/DVT. Denies recent trauma/surgeires, hx of DVT/PE, long car or plane rides, sedentary lifestyle, OCP use, or malignancy 3. She is always cold and is not anemic  She has a son , las pap was in 2015 and was normal.   History reviewed. No pertinent past medical history. Past Surgical History  Procedure Laterality Date  . Dilation and curettage of uterus     History   Social History  . Marital Status: Divorced    Spouse Name: N/A    Number of Children: N/A  . Years of Education: N/A   Social History Main Topics  . Smoking status: Never Smoker   . Smokeless tobacco: Never Used  . Alcohol Use: No  . Drug Use: No  . Sexual Activity: Yes    Birth Control/ Protection: None   Other Topics Concern  . None   Social History Narrative   Family History  Problem Relation Age of  Onset  . Diabetes Maternal Grandmother   . Diabetes Maternal Grandfather   . Diabetes Paternal Grandmother   . Diabetes Paternal Grandfather    No Known Allergies Prior to Admission medications   Not on File     ROS: The patient denies fevers, chills, night sweats, unintentional weight loss, chest pain, palpitations, wheezing, dyspnea on exertion, nausea, vomiting, abdominal pain, dysuria, hematuria, melena, numbness, weakness, or tingling.  All other systems have been reviewed and were otherwise negative with the exception of those mentioned in the HPI and as above.    PHYSICAL EXAM: Filed Vitals:   10/11/14 0908  BP: 100/68  Pulse: 79  Temp: 98.1 F (36.7 C)  Resp: 16   SpO2 Readings from Last 3 Encounters:  10/11/14 100%  06/22/14 100%  06/17/14 100%    Filed Vitals:   10/11/14 0908  Height:  (1.702 m)  Weight: 128 lb 12.8 oz (58.423 kg)   Body mass index is 20.17 kg/(m^2).  General: Alert, no acute distress HEENT:  Normocephalic, atraumatic, oropharynx patent. EOMI, PERRLA Cardiovascular:  Regular rate and rhythm, no rubs murmurs or gallops.  No Carotid bruits, radial pulse intact. No pedal edema.  Respiratory: Clear to auscultation bilaterally.  No wheezes, rales, or rhonchi.  No cyanosis, no use of accessory musculature GI: No organomegaly, abdomen is soft and non-tender, positive bowel sounds.  No masses. Skin: No rashes. Neurologic: Facial musculature symmetric. Psychiatric: Patient is appropriate throughout our interaction. Lymphatic: No cervical lymphadenopathy Musculoskeletal: Gait intact.  Neg erythema/swelling, nontender , calf nl, neg Homan's  5/5 strength Right ankle normal exam Breast exam-no real nipple changes , she denies any nipple stimulation, no palpable cords, no masses/rashes, lesions, no dc   LABS: Results for orders placed or performed in visit on 10/11/14  POCT CBC  Result Value Ref Range   WBC 4.6 4.6 - 10.2 K/uL   Lymph, poc  1.9 0.6 - 3.4   POC LYMPH PERCENT 40.4 10 - 50 %L   MID (cbc) 0.4 0 - 0.9   POC MID % 8.3 0 - 12 %M   POC Granulocyte 2.4 2 - 6.9   Granulocyte percent 51.3 37 - 80 %G   RBC 4.09 4.04 - 5.48 M/uL   Hemoglobin 13.1 12.2 - 16.2 g/dL   HCT, POC 78.440.4 69.637.7 - 47.9 %   MCV 98.6 (A) 80 - 97 fL   MCH, POC 32.1 (A) 27 - 31.2 pg   MCHC 32.5 31.8 - 35.4 g/dL   RDW, POC 29.512.9 %   Platelet Count, POC 197 142 - 424 K/uL   MPV 8.7 0 - 99.8 fL  POCT glycosylated hemoglobin (Hb A1C)  Result Value Ref Range   Hemoglobin A1C 4.7      EKG/XRAY:   Primary [facility]Le [facility]. Normal chest   ASSESSMENT/PLAN: Encounter Diagnoses  Name Primary?  . SOB (shortness of breath) Yes  . Screening for diabetes mellitus   . Cold skin   . Nipple soreness    Avoid stimulation of nipple,lubricate with vasoline or aquaphor Labs pending Cxr and labs are nl, TSH and CMP pending If worsening sxs then will return, may consider mammogram at that point She is just anxious because she googled all her sxs and spoke with her friends  Low to minimal risk of DVT/PE.  F/u prn   Gross sideeffects, risk and benefits, and alternatives of medications d/w patient. Patient is aware that all medications have potential sideeffects and we are unable to predict every sideeffect or drug-drug interaction that may occur.  Alyssamarie Mounsey PHUONG, DO 10/11/2014 10:14 AM

## 2015-05-24 ENCOUNTER — Encounter: Payer: Self-pay | Admitting: Internal Medicine

## 2015-08-03 ENCOUNTER — Ambulatory Visit: Admitting: Internal Medicine

## 2016-11-15 ENCOUNTER — Encounter: Payer: Self-pay | Admitting: Obstetrics

## 2016-11-15 ENCOUNTER — Ambulatory Visit (INDEPENDENT_AMBULATORY_CARE_PROVIDER_SITE_OTHER): Admitting: Obstetrics

## 2016-11-15 VITALS — BP 110/69 | HR 69 | Ht 67.5 in | Wt 124.0 lb

## 2016-11-15 DIAGNOSIS — Z124 Encounter for screening for malignant neoplasm of cervix: Secondary | ICD-10-CM

## 2016-11-15 DIAGNOSIS — Z0000 Encounter for general adult medical examination without abnormal findings: Secondary | ICD-10-CM

## 2016-11-15 DIAGNOSIS — Z113 Encounter for screening for infections with a predominantly sexual mode of transmission: Secondary | ICD-10-CM

## 2016-11-15 DIAGNOSIS — Z01419 Encounter for gynecological examination (general) (routine) without abnormal findings: Secondary | ICD-10-CM

## 2016-11-15 DIAGNOSIS — B9689 Other specified bacterial agents as the cause of diseases classified elsewhere: Secondary | ICD-10-CM

## 2016-11-15 DIAGNOSIS — Z01411 Encounter for gynecological examination (general) (routine) with abnormal findings: Secondary | ICD-10-CM

## 2016-11-15 DIAGNOSIS — N760 Acute vaginitis: Secondary | ICD-10-CM

## 2016-11-15 MED ORDER — METRONIDAZOLE 500 MG PO TABS: 500.0000 mg | ORAL_TABLET | Freq: Two times a day (BID) | ORAL | 5 refills | Status: DC

## 2016-11-15 NOTE — Patient Instructions (Addendum)
Bacterial Vaginosis Bacterial vaginosis is a vaginal infection that occurs when the normal balance of bacteria in the vagina is disrupted. It results from an overgrowth of certain bacteria. This is the most common vaginal infection among women ages 15-44. Because bacterial vaginosis increases your risk for STIs (sexually transmitted infections), getting treated can help reduce your risk for chlamydia, gonorrhea, herpes, and HIV (human immunodeficiency virus). Treatment is also important for preventing complications in pregnant women, because this condition can cause an early (premature) delivery. What are the causes? This condition is caused by an increase in harmful bacteria that are normally present in small amounts in the vagina. However, the reason that the condition develops is not fully understood. What increases the risk? The following factors may make you more likely to develop this condition:  Having a new sexual partner or multiple sexual partners.  Having unprotected sex.  Douching.  Having an intrauterine device (IUD).  Smoking.  Drug and alcohol abuse.  Taking certain antibiotic medicines.  Being pregnant.  You cannot get bacterial vaginosis from toilet seats, bedding, swimming pools, or contact with objects around you. What are the signs or symptoms? Symptoms of this condition include:  Grey or white vaginal discharge. The discharge can also be watery or foamy.  A fish-like odor with discharge, especially after sexual intercourse or during menstruation.  Itching in and around the vagina.  Burning or pain with urination.  Some women with bacterial vaginosis have no signs or symptoms. How is this diagnosed? This condition is diagnosed based on:  Your medical history.  A physical exam of the vagina.  Testing a sample of vaginal fluid under a microscope to look for a large amount of bad bacteria or abnormal cells. Your health care provider may use a cotton swab  or a small wooden spatula to collect the sample.  How is this treated? This condition is treated with antibiotics. These may be given as a pill, a vaginal cream, or a medicine that is put into the vagina (suppository). If the condition comes back after treatment, a second round of antibiotics may be needed. Follow these instructions at home: Medicines  Take over-the-counter and prescription medicines only as told by your health care provider.  Take or use your antibiotic as told by your health care provider. Do not stop taking or using the antibiotic even if you start to feel better. General instructions  If you have a female sexual partner, tell her that you have a vaginal infection. She should see her health care provider and be treated if she has symptoms. If you have a female sexual partner, he does not need treatment.  During treatment: ? Avoid sexual activity until you finish treatment. ? Do not douche. ? Avoid alcohol as directed by your health care provider. ? Avoid breastfeeding as directed by your health care provider.  Drink enough water and fluids to keep your urine clear or pale yellow.  Keep the area around your vagina and rectum clean. ? Wash the area daily with warm water. ? Wipe yourself from front to back after using the toilet.  Keep all follow-up visits as told by your health care provider. This is important. How is this prevented?  Do not douche.  Wash the outside of your vagina with warm water only.  Use protection when having sex. This includes latex condoms and dental dams.  Limit how many sexual partners you have. To help prevent bacterial vaginosis, it is best to have sex with just   one partner (monogamous).  Make sure you and your sexual partner are tested for STIs.  Wear cotton or cotton-lined underwear.  Avoid wearing tight pants and pantyhose, especially during summer.  Limit the amount of alcohol that you drink.  Do not use any products that  contain nicotine or tobacco, such as cigarettes and e-cigarettes. If you need help quitting, ask your health care provider.  Do not use illegal drugs. Where to find more information:  Centers for Disease Control and Prevention: www.cdc.gov/std  American Sexual Health Association (ASHA): www.ashastd.org  U.S. Department of Health and Human Services, Office on Women's Health: www.womenshealth.gov/ or https://www.womenshealth.gov/a-z-topics/bacterial-vaginosis Contact a health care provider if:  Your symptoms do not improve, even after treatment.  You have more discharge or pain when urinating.  You have a fever.  You have pain in your abdomen.  You have pain during sex.  You have vaginal bleeding between periods. Summary  Bacterial vaginosis is a vaginal infection that occurs when the normal balance of bacteria in the vagina is disrupted.  Because bacterial vaginosis increases your risk for STIs (sexually transmitted infections), getting treated can help reduce your risk for chlamydia, gonorrhea, herpes, and HIV (human immunodeficiency virus). Treatment is also important for preventing complications in pregnant women, because the condition can cause an early (premature) delivery.  This condition is treated with antibiotic medicines. These may be given as a pill, a vaginal cream, or a medicine that is put into the vagina (suppository). This information is not intended to replace advice given to you by your health care provider. Make sure you discuss any questions you have with your health care provider. Document Released: 08/21/2005 Document Revised: 05/06/2016 Document Reviewed: 05/06/2016 Elsevier Interactive Patient Education  2017 Elsevier Inc.  

## 2016-11-15 NOTE — Progress Notes (Signed)
Subjective:        Amy Reed is a 31 y.o. female here for a routine exam.  Current complaints: Malodorous vaginal discharge.    Personal health questionnaire:  Is patient Ashkenazi Jewish, have a family history of breast and/or ovarian cancer: no Is there a family history of uterine cancer diagnosed at age < 1950, gastrointestinal cancer, urinary tract cancer, family member who is a Personnel officerLynch syndrome-associated carrier: no Is the patient overweight and hypertensive, family history of diabetes, personal history of gestational diabetes, preeclampsia or PCOS: no Is patient over 8555, have PCOS,  family history of premature CHD under age 32, diabetes, smoke, have hypertension or peripheral artery disease:  no At any time, has a partner hit, kicked or otherwise hurt or frightened you?: no Over the past 2 weeks, have you felt down, depressed or hopeless?: no Over the past 2 weeks, have you felt little interest or pleasure in doing things?:no   Gynecologic History Patient's last menstrual period was 11/01/2016. Contraception: none Last Pap: 2015. Results were: normal Last mammogram: n/a. Results were: n/a  Obstetric History OB History  Gravida Para Term Preterm AB Living  2 1   1 1     SAB TAB Ectopic Multiple Live Births  1            # Outcome Date GA Lbr Len/2nd Weight Sex Delivery Anes PTL Lv  2 Preterm 12/10/07 [id number]   M Vag-Spont EPI    1 SAB 2006              History reviewed. No pertinent past medical history.  Past Surgical History:  Procedure Laterality Date  . DILATION AND CURETTAGE OF UTERUS       Current Outpatient Prescriptions:  .  metroNIDAZOLE (FLAGYL) 500 MG tablet, Take 1 tablet (500 mg total) by mouth 2 (two) times daily., Disp: 14 tablet, Rfl: 5 No Known Allergies  Social History  Substance Use Topics  . Smoking status: Never Smoker  . Smokeless tobacco: Never Used  . Alcohol use Yes     Comment: occ.    Family History  Problem Relation Age of  Onset  . Colon cancer Father   . Cervical polyp Sister   . Diabetes Maternal Grandmother   . Diabetes Maternal Grandfather   . Diabetes Paternal Grandmother   . Cervical cancer Paternal Grandmother   . Diabetes Paternal Grandfather   . Colon cancer Paternal Grandfather   . Colon cancer Paternal Aunt   . Colon cancer Paternal Aunt       Review of Systems  Constitutional: negative for fatigue and weight loss Respiratory: negative for cough and wheezing Cardiovascular: negative for chest pain, fatigue and palpitations Gastrointestinal: negative for abdominal pain and change in bowel habits Musculoskeletal:negative for myalgias Neurological: negative for gait problems and tremors Behavioral/Psych: negative for abusive relationship, depression Endocrine: negative for temperature intolerance    Genitourinary:positive for malodorous vaginal discharge Integument/breast: negative for breast lump, breast tenderness, nipple discharge and skin lesion(s)    Objective:       BP 110/69   Pulse 69   Ht 5' 7.5" (1.715 m)   Wt 124 lb (56.2 kg)   LMP 11/01/2016   BMI 19.13 kg/m  General:   alert  Skin:   no rash or abnormalities  Lungs:   clear to auscultation bilaterally  Heart:   regular rate and rhythm, S1, S2 normal, no murmur, click, rub or gallop  Breasts:   normal without suspicious masses,  skin or nipple changes or axillary nodes  Abdomen:  normal findings: no organomegaly, soft, non-tender and no hernia  Pelvis:  External genitalia: normal general appearance Urinary system: urethral meatus normal and bladder without fullness, nontender Vaginal: normal without tenderness, induration or masses Cervix: normal appearance Adnexa: normal bimanual exam Uterus: anteverted and non-tender, normal size   Lab Review Urine pregnancy test Labs reviewed yes Radiologic studies reviewed no  50% of 20 min visit spent on counseling and coordination of care.    Assessment:    Healthy  female exam.    Plan:   Flagyl Rx for BV Education reviewed:  -Safe sex and prevention of BV.   F/U in 1 year  Meds ordered this encounter  Medications  . metroNIDAZOLE (FLAGYL) 500 MG tablet    Sig: Take 1 tablet (500 mg total) by mouth 2 (two) times daily.    Dispense:  14 tablet    Refill:  5   Orders Placed This Encounter  Procedures  . Hepatitis B surface antigen  . Hepatitis C antibody  . HIV antibody  . RPR    Pt presents for annual, pap, and all STD testing. Pt c/o white vaginal discharge with odor no itching x 1 wk. Patient ID: Amy Reed, female   DOB: 09-Nov-1985, 31 y.o.   MRN: [id number]

## 2016-11-16 LAB — HEPATITIS C ANTIBODY: Hep C Virus Ab: 0.1 s/co ratio (ref 0.0–0.9)

## 2016-11-16 LAB — CERVICOVAGINAL ANCILLARY ONLY
Bacterial vaginitis: NEGATIVE
Candida vaginitis: NEGATIVE
Chlamydia: NEGATIVE
Neisseria Gonorrhea: NEGATIVE
Trichomonas: NEGATIVE

## 2016-11-16 LAB — RPR: RPR Ser Ql: NONREACTIVE

## 2016-11-16 LAB — HIV ANTIBODY (ROUTINE TESTING W REFLEX): HIV Screen 4th Generation wRfx: NONREACTIVE

## 2016-11-16 LAB — HEPATITIS B SURFACE ANTIGEN: Hepatitis B Surface Ag: NEGATIVE

## 2016-11-17 LAB — CYTOLOGY - PAP
Diagnosis: NEGATIVE
HPV: NOT DETECTED

## 2016-12-05 ENCOUNTER — Ambulatory Visit: Admitting: Obstetrics

## 2016-12-05 ENCOUNTER — Telehealth: Payer: Self-pay | Admitting: *Deleted

## 2016-12-05 NOTE — Telephone Encounter (Signed)
Pt called to office about Rx.  Return call to pt. Pt states she was given Flagyl to take for several months after each cycle. Pt would like to know if there is another antibiotic she may take as the Flagyl is making her sick.  Please advise on other treatment options for pt.

## 2016-12-06 ENCOUNTER — Other Ambulatory Visit: Payer: Self-pay | Admitting: Obstetrics

## 2016-12-06 DIAGNOSIS — N760 Acute vaginitis: Principal | ICD-10-CM

## 2016-12-06 DIAGNOSIS — B9689 Other specified bacterial agents as the cause of diseases classified elsewhere: Secondary | ICD-10-CM

## 2016-12-06 MED ORDER — METRONIDAZOLE 0.75 % VA GEL: 1.0000 | Freq: Two times a day (BID) | VAGINAL | 5 refills | Status: DC

## 2016-12-06 NOTE — Telephone Encounter (Signed)
LM on VM making pt aware of change in Rx.

## 2016-12-06 NOTE — Telephone Encounter (Signed)
MetroGel Vaginal Rx for chronic BV treatment after each period for 6 months.

## 2016-12-08 ENCOUNTER — Telehealth: Payer: Self-pay

## 2016-12-08 NOTE — Telephone Encounter (Signed)
Returned call, no answer, left vm 

## 2017-01-23 ENCOUNTER — Emergency Department (HOSPITAL_COMMUNITY)

## 2017-01-23 ENCOUNTER — Emergency Department (HOSPITAL_COMMUNITY)
Admission: EM | Admit: 2017-01-23 | Discharge: 2017-01-23 | Disposition: A | Discharge status: Home / Self Care | Attending: Emergency Medicine | Admitting: Emergency Medicine

## 2017-01-23 ENCOUNTER — Encounter (HOSPITAL_COMMUNITY): Payer: Self-pay | Admitting: Emergency Medicine

## 2017-01-23 DIAGNOSIS — Z79899 Other long term (current) drug therapy: Secondary | ICD-10-CM | POA: Diagnosis not present

## 2017-01-23 DIAGNOSIS — Y929 Unspecified place or not applicable: Secondary | ICD-10-CM | POA: Diagnosis not present

## 2017-01-23 DIAGNOSIS — M79641 Pain in right hand: Secondary | ICD-10-CM | POA: Diagnosis not present

## 2017-01-23 DIAGNOSIS — Y999 Unspecified external cause status: Secondary | ICD-10-CM | POA: Diagnosis not present

## 2017-01-23 DIAGNOSIS — Y939 Activity, unspecified: Secondary | ICD-10-CM | POA: Insufficient documentation

## 2017-01-23 DIAGNOSIS — W2201XA Walked into wall, initial encounter: Secondary | ICD-10-CM | POA: Diagnosis not present

## 2017-01-23 DIAGNOSIS — Z91040 Latex allergy status: Secondary | ICD-10-CM | POA: Diagnosis not present

## 2017-01-23 DIAGNOSIS — S6991XA Unspecified injury of right wrist, hand and finger(s), initial encounter: Secondary | ICD-10-CM | POA: Diagnosis present

## 2017-01-23 NOTE — Discharge Instructions (Signed)
Alternate ibuprofen and Tylenol every 3 hours for the next few days for pain. Wear the splint as needed for pain. Follow-up with the hand surgeons if your symptoms persist. Return to the ED if any concerning symptoms develop.

## 2017-01-23 NOTE — ED Triage Notes (Signed)
Pt c/o right hand pain, punched wall 1 week ago. Right 5th metatarsal point tenderness, swelling, decreased ROM. Sensation intact.

## 2017-01-23 NOTE — ED Notes (Signed)
Patient left without having wrist splint placed. Patient did not sign for paperwork or have discharge instructions reviewed. She had previously said that she had to get to work.

## 2017-01-23 NOTE — ED Notes (Signed)
Bed: WTR6 Expected date:  Expected time:  Means of arrival:  Comments: 

## 2017-01-23 NOTE — ED Provider Notes (Signed)
[facility]-EMERGENCY DEPT Provider Note   CSN: [id number] Arrival date & time: 01/23/17  4098     History   Chief Complaint Chief Complaint  Patient presents with  . Hand Injury    HPI Amy Reed is a 31 y.o. female who presents today with chief complaint acute onset, gradually improving right lateral hand pain after punching a wall one week ago. She states that initially the pain was sharp and associated with swelling of the right fifth MTP joint. Swelling has improved but is still present and pain is still present as well. She denies head injury or loss of consciousness. She denies numbness, tingling, weakness. She has not tried anything for her symptoms. Movement makes her pain worse. 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 She has no other medical complaints at this time.  The history is provided by the patient.    History reviewed. No pertinent past medical history.  Patient Active Problem List   Diagnosis Date Noted  . Candidiasis of vulva and vagina 01/06/2014  . BV (bacterial vaginosis) 01/06/2014  . Vaginal irritation 01/06/2014    Past Surgical History:  Procedure Laterality Date  . DILATION AND CURETTAGE OF UTERUS      OB History    Gravida Para Term Preterm AB Living   2 1   1 1      SAB TAB Ectopic Multiple Live Births   1               Home Medications    Prior to Admission medications   Medication Sig Start Date End Date Taking? Authorizing Provider  metroNIDAZOLE (FLAGYL) 500 MG tablet Take 1 tablet (500 mg total) by mouth 2 (two) times daily. 11/15/16   Brock Bad, MD  metroNIDAZOLE (METROGEL VAGINAL) 0.75 % vaginal gel Place 1 Applicatorful vaginally 2 (two) times daily. 12/06/16   Brock Bad, MD    Family History Family History  Problem Relation Age of Onset  . Colon cancer Father   . Cervical polyp Sister   . Diabetes Maternal Grandmother   . Diabetes Maternal Grandfather   . Diabetes Paternal Grandmother   . Cervical  cancer Paternal Grandmother   . Diabetes Paternal Grandfather   . Colon cancer Paternal Grandfather   . Colon cancer Paternal Aunt   . Colon cancer Paternal Aunt     Social History Social History  Substance Use Topics  . Smoking status: Never Smoker  . Smokeless tobacco: Never Used  . Alcohol use Yes     Comment: occ.     Allergies   Latex   Review of Systems Review of Systems  Constitutional: Negative for chills and fever.  Respiratory: Negative for shortness of breath.   Cardiovascular: Negative for chest pain.  Musculoskeletal: Positive for arthralgias.  Neurological: Negative for syncope and headaches.  All other systems reviewed and are negative.    Physical Exam Updated Vital Signs BP 140/84 (BP Location: Left Arm)   Pulse 66   Temp 97.6 F (36.4 C) (Oral)   Resp 17   SpO2 100%   Physical Exam  Constitutional: She appears well-developed and well-nourished.  HENT:  Head: Normocephalic and atraumatic.  Eyes: Right eye exhibits no discharge. Left eye exhibits no discharge.  Neck: No JVD present. No tracheal deviation present.  Cardiovascular: Normal rate and intact distal pulses.   2+ radial pulses bilaterally  Pulmonary/Chest: Effort normal.  Abdominal: She exhibits no distension.  Musculoskeletal: She exhibits edema and  tenderness.  Right hand with swelling to the fifth MTP joint. Limited range of motion of the fifth digit due to pain. Tenderness to palpation along the MTP joint and distally to the proximal phalanx as well as inferiorly to the middle of the 5th metatarsal.  5/5 strength of digit against flexion and extension with resistance. Normal range of motion of wrist. No snuffbox tenderness. No deformity or crepitus noted. Good grip strength.  Neurological: She is alert.  Fluent speech, no facial droop, sensation of the right hand intact.  Skin: Skin is warm and dry. Capillary refill takes less than 2 seconds.  Psychiatric: She has a normal mood and  affect. Her behavior is normal.     ED Treatments / Results  Labs (all labs ordered are listed, but only abnormal results are displayed) Labs Reviewed - No data to display  EKG  EKG Interpretation None       Radiology Dg Hand Complete Right  Result Date: 01/23/2017 CLINICAL DATA:  Pt states she punched a wall x 1 week ago and still having pain, sts rt hand 5th mcp joint EXAM: RIGHT HAND - COMPLETE 3+ VIEW COMPARISON:  None. FINDINGS: There is no evidence of fracture or dislocation. There is no evidence of arthropathy or other focal bone abnormality. Soft tissue swelling over the fifth MCP joint. IMPRESSION: No acute osseous injury of the right hand. Soft tissue swelling over the right fifth MCP joint. Electronically Signed   By: Elige KoHetal  Patel   On: 01/23/2017 09:04    Procedures Procedures (including critical care time)  Medications Ordered in ED Medications - No data to display   Initial Impression / Assessment and Plan / ED Course  I have reviewed the triage vital signs and the nursing notes.  Pertinent labs & imaging results that were available during my care of the patient were reviewed by me and considered in my medical decision making (see chart for details).     Patient with right hand pain for one week. Afebrile, vital signs stable. Neurovascularly intact. There is swelling to the right fifth MTP joint. X-rays negative for fracture dislocation but soft tissue swelling is noted. Discussed ice, heat, RICE therapy, and follow up with hand surgery if symptoms persist. She was given a wrist splint for comfort. Discussed strict ED return precautions. Pt verbalized understanding of and agreement with plan and is safe for discharge home at this time.  Final Clinical Impressions(s) / ED Diagnoses   Final diagnoses:  Right hand pain    New Prescriptions New Prescriptions   No medications on file     Bennye AlmFawze, Susana Gripp A, PA-C 01/23/17 1005    Lorre NickAllen, Anthony, MD 01/23/17  [phone]

## 2017-05-24 ENCOUNTER — Emergency Department (HOSPITAL_COMMUNITY): Admission: EM | Admit: 2017-05-24 | Discharge: 2017-05-24 | Discharge status: Against Medical Advice

## 2017-05-24 NOTE — ED Triage Notes (Signed)
Pt called from triage no answer 

## 2017-07-29 ENCOUNTER — Other Ambulatory Visit: Payer: Self-pay

## 2017-07-29 ENCOUNTER — Emergency Department (HOSPITAL_COMMUNITY)
Admission: EM | Admit: 2017-07-29 | Discharge: 2017-07-29 | Disposition: A | Discharge status: Home / Self Care | Attending: Emergency Medicine | Admitting: Emergency Medicine

## 2017-07-29 ENCOUNTER — Encounter (HOSPITAL_COMMUNITY): Payer: Self-pay | Admitting: Emergency Medicine

## 2017-07-29 DIAGNOSIS — T7840XA Allergy, unspecified, initial encounter: Secondary | ICD-10-CM

## 2017-07-29 DIAGNOSIS — T781XXA Other adverse food reactions, not elsewhere classified, initial encounter: Secondary | ICD-10-CM | POA: Insufficient documentation

## 2017-07-29 DIAGNOSIS — Z91040 Latex allergy status: Secondary | ICD-10-CM | POA: Insufficient documentation

## 2017-07-29 DIAGNOSIS — R070 Pain in throat: Secondary | ICD-10-CM | POA: Insufficient documentation

## 2017-07-29 MED ORDER — DIPHENHYDRAMINE HCL 50 MG/ML IJ SOLN
50.0000 mg | Freq: Once | INTRAMUSCULAR | Status: AC
  Administered 2017-07-29: 50 mg via INTRAVENOUS
  Filled 2017-07-29: qty 1

## 2017-07-29 MED ORDER — PREDNISONE 50 MG PO TABS: ORAL_TABLET | ORAL | 0 refills | Status: DC

## 2017-07-29 MED ORDER — METHYLPREDNISOLONE SODIUM SUCC 125 MG IJ SOLR
125.0000 mg | Freq: Once | INTRAMUSCULAR | Status: AC
  Administered 2017-07-29: 125 mg via INTRAVENOUS
  Filled 2017-07-29: qty 2

## 2017-07-29 MED ORDER — EPINEPHRINE 0.3 MG/0.3ML IJ SOAJ: 0.3000 mg | Freq: Once | INTRAMUSCULAR | 2 refills | Status: AC

## 2017-07-29 MED ORDER — FAMOTIDINE IN NACL 20-0.9 MG/50ML-% IV SOLN
20.0000 mg | Freq: Once | INTRAVENOUS | Status: AC
  Administered 2017-07-29: 20 mg via INTRAVENOUS
  Filled 2017-07-29: qty 50

## 2017-07-29 NOTE — ED Provider Notes (Signed)
[facility]-EMERGENCY DEPT Provider Note   CSN: [id number] Arrival date & time: 07/29/17  1929     History   Chief Complaint Chief Complaint  Patient presents with  . Allergic Reaction    HPI Amy Reed is a 31 y.o. female.  31 year old female presents complaining of throat tightness after being exposed to shellfish.  Patient has a known shellfish allergy.  She denies any severe dyspnea.  Does not like her throat is closing up.  No hives noted.  Denies any rashes on her body.  No pruritus.  Took Benadryl with limited relief.  Did not use an epinephrine pen.      History reviewed. No pertinent past medical history.  Patient Active Problem List   Diagnosis Date Noted  . Candidiasis of vulva and vagina 01/06/2014  . BV (bacterial vaginosis) 01/06/2014  . Vaginal irritation 01/06/2014    Past Surgical History:  Procedure Laterality Date  . DILATION AND CURETTAGE OF UTERUS      OB History    Gravida Para Term Preterm AB Living   2 1   1 1      SAB TAB Ectopic Multiple Live Births   1               Home Medications    Prior to Admission medications   Medication Sig Start Date End Date Taking? Authorizing Provider  metroNIDAZOLE (FLAGYL) 500 MG tablet Take 1 tablet (500 mg total) by mouth 2 (two) times daily. 11/15/16   Brock BadHarper, Charles A, MD  metroNIDAZOLE (METROGEL VAGINAL) 0.75 % vaginal gel Place 1 Applicatorful vaginally 2 (two) times daily. 12/06/16   Brock BadHarper, Charles A, MD    Family History Family History  Problem Relation Age of Onset  . Colon cancer Father   . Cervical polyp Sister   . Diabetes Maternal Grandmother   . Diabetes Maternal Grandfather   . Diabetes Paternal Grandmother   . Cervical cancer Paternal Grandmother   . Diabetes Paternal Grandfather   . Colon cancer Paternal Grandfather   . Colon cancer Paternal Aunt   . Colon cancer Paternal Aunt     Social History Social History   Tobacco Use  . Smoking status:  Never Smoker  . Smokeless tobacco: Never Used  Substance Use Topics  . Alcohol use: Yes    Comment: occ.  . Drug use: No     Allergies   Shellfish allergy and Latex   Review of Systems Review of Systems  All other systems reviewed and are negative.    Physical Exam Updated Vital Signs BP 132/86 (BP Location: Right Arm)   Pulse 94   Temp 97.6 F (36.4 C) (Oral)   Resp 18   Ht 1.702 m (5\' 7" )   Wt 54.4 kg (120 lb)   LMP 07/13/2017   SpO2 100%   BMI 18.79 kg/m   Physical Exam  Constitutional: She is oriented to person, place, and time. She appears well-developed and well-nourished.  Non-toxic appearance. No distress.  HENT:  Head: Normocephalic and atraumatic.  Eyes: Conjunctivae, EOM and lids are normal. Pupils are equal, round, and reactive to light.  Neck: Normal range of motion. Neck supple. No tracheal deviation present. No thyroid mass present.  Cardiovascular: Normal rate, regular rhythm and normal heart sounds. Exam reveals no gallop.  No murmur heard. Pulmonary/Chest: Effort normal and breath sounds normal. No stridor. No respiratory distress. She has no decreased breath sounds. She has no wheezes. She has no rhonchi. She  has no rales.  Abdominal: Soft. Normal appearance and bowel sounds are normal. She exhibits no distension. There is no tenderness. There is no rebound and no CVA tenderness.  Musculoskeletal: Normal range of motion. She exhibits no edema or tenderness.  Neurological: She is alert and oriented to person, place, and time. She has normal strength. No cranial nerve deficit or sensory deficit. GCS eye subscore is 4. GCS verbal subscore is 5. GCS motor subscore is 6.  Skin: Skin is warm and dry. No abrasion and no rash noted.  Psychiatric: She has a normal mood and affect. Her speech is normal and behavior is normal.  Nursing note and vitals reviewed.    ED Treatments / Results  Labs (all labs ordered are listed, but only abnormal results are  displayed) Labs Reviewed - No data to display  EKG  EKG Interpretation None       Radiology No results found.  Procedures Procedures (including critical care time)  Medications Ordered in ED Medications  famotidine (PEPCID) IVPB 20 mg premix (20 mg Intravenous New Bag/Given 07/29/17 1956)  methylPREDNISolone sodium succinate (SOLU-MEDROL) 125 mg/2 mL injection 125 mg (125 mg Intravenous Given 07/29/17 1956)     Initial Impression / Assessment and Plan / ED Course  I have reviewed the triage vital signs and the nursing notes.  Pertinent labs & imaging results that were available during my care of the patient were reviewed by me and considered in my medical decision making (see chart for details).    Patient treated with Benadryl, Solu-Medrol, Pepcid and feels better.  Monitor for several hours and no signs of stridor or airway compromise.  Will be discharged home with prescription for epinephrine pen as well as 2 days of steroids  Final Clinical Impressions(s) / ED Diagnoses   Final diagnoses:  None    ED Discharge Orders    None       Lorre NickAllen, Grazia Taffe, MD 07/29/17 2249

## 2017-07-29 NOTE — ED Triage Notes (Signed)
Patient went to go [place] and came in contact with shell fish. She is allergic to shell fish. Patient states she feels her throat closing up. Patient state she has taken benadryl. Patient states it started 15 minutes ago.

## 2017-07-29 NOTE — ED Notes (Signed)
Patient is talking with no difficulty. Patient states that she thinks some shellfish was on the utensils maybe because she didn't eat shell fish.

## 2017-07-31 ENCOUNTER — Encounter (HOSPITAL_COMMUNITY): Payer: Self-pay | Admitting: Family Medicine

## 2017-07-31 ENCOUNTER — Emergency Department (HOSPITAL_COMMUNITY)

## 2017-07-31 ENCOUNTER — Emergency Department (HOSPITAL_COMMUNITY)
Admission: EM | Admit: 2017-07-31 | Discharge: 2017-07-31 | Disposition: A | Discharge status: Home / Self Care | Attending: Emergency Medicine | Admitting: Emergency Medicine

## 2017-07-31 DIAGNOSIS — T7840XA Allergy, unspecified, initial encounter: Secondary | ICD-10-CM | POA: Diagnosis not present

## 2017-07-31 DIAGNOSIS — Z91040 Latex allergy status: Secondary | ICD-10-CM | POA: Diagnosis not present

## 2017-07-31 DIAGNOSIS — K0889 Other specified disorders of teeth and supporting structures: Secondary | ICD-10-CM | POA: Diagnosis not present

## 2017-07-31 DIAGNOSIS — R0789 Other chest pain: Secondary | ICD-10-CM | POA: Diagnosis present

## 2017-07-31 LAB — I-STAT BETA HCG BLOOD, ED (MC, WL, AP ONLY): I-stat hCG, quantitative: <5 m[IU]/mL (ref <5)

## 2017-07-31 LAB — CBC
HCT: 40.3 % (ref 36.0–46.0)
Hemoglobin: 14.1 g/dL (ref 12.0–15.0)
MCH: 33.6 pg (ref 26.0–34.0)
MCHC: 35 g/dL (ref 30.0–36.0)
MCV: 96 fL (ref 78.0–100.0)
Platelets: 227 10*3/uL (ref 150–400)
RBC: 4.2 MIL/uL (ref 3.87–5.11)
RDW: 12.4 % (ref 11.5–15.5)
WBC: 8.9 10*3/uL (ref 4.0–10.5)

## 2017-07-31 LAB — BASIC METABOLIC PANEL
Anion gap: 9 (ref 5–15)
BUN: 11 mg/dL (ref 6–20)
CO2: 22 mmol/L (ref 22–32)
Calcium: 9.8 mg/dL (ref 8.9–10.3)
Chloride: 105 mmol/L (ref 101–111)
Creatinine, Ser: 0.79 mg/dL (ref 0.44–1.00)
GFR calc Af Amer: >60 mL/min (ref >60)
GFR calc non Af Amer: >60 mL/min (ref >60)
Glucose, Bld: 116 mg/dL — ABNORMAL HIGH (ref 65–99)
Potassium: 3.3 mmol/L — ABNORMAL LOW (ref 3.5–5.1)
Sodium: 136 mmol/L (ref 135–145)

## 2017-07-31 LAB — I-STAT TROPONIN, ED: Troponin i, poc: 0 ng/mL (ref 0.00–0.08)

## 2017-07-31 NOTE — ED Triage Notes (Signed)
Patient is complaining of right sided chest pain that started 4 hours ago while sitting. Pain is described as sharp with shortness of breath, dizziness, lightheadedness, diaphoresis, back pain, and weakness. Currently, patient ambulated from lobby to triage room with a steady gait. Able to talk in full sentences. Skin is warm and dry. Respirations are even, regular, and unlabored. Also, reports she feels her allergic reaction symptoms from Sunday nights visit is getting worse. She reports she feels like her throat is closing. She reports taking Benadryl at noon today and been taking her PREDNISONE as prescribed.

## 2017-07-31 NOTE — Discharge Instructions (Signed)
Continue taking the previously prescribed medications.  Make sure that you are getting plenty of rest and drinking a lot of fluids.  Consider working on stress management to improve your symptoms.

## 2017-07-31 NOTE — ED Provider Notes (Signed)
[facility]-EMERGENCY DEPT Provider Note   CSN: [id number] Arrival date & time: 07/31/17  2104     History   Chief Complaint Chief Complaint  Patient presents with  . Chest Pain  . Allergic Reaction    HPI Amy Reed is a 31 y.o. female.  She presents for evaluation of right-sided tooth pain, onset tonight without trauma.  She is concerned that she might be having an allergic reaction.  She was evaluated here 4 days ago, at that time, she was treated for possible shellfish allergy, with Benadryl Solu-Medrol and Pepcid.  She was discharged with a short course of prednisone.  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  She has been able to eat without problems.  There are no other known modifying factors.    HPI  History reviewed. No pertinent past medical history.  Patient Active Problem List   Diagnosis Date Noted  . Candidiasis of vulva and vagina 01/06/2014  . BV (bacterial vaginosis) 01/06/2014  . Vaginal irritation 01/06/2014    Past Surgical History:  Procedure Laterality Date  . DILATION AND CURETTAGE OF UTERUS      OB History    Gravida Para Term Preterm AB Living   2 1   1 1      SAB TAB Ectopic Multiple Live Births   1               Home Medications    Prior to Admission medications   Medication Sig Start Date End Date Taking? Authorizing Provider  diphenhydrAMINE (BENADRYL) 25 MG tablet Take 50 mg by mouth once.   Yes [provider]  predniSONE (DELTASONE) 50 MG tablet 1 p.o. daily x3 07/29/17  Yes Lorre NickAllen, Anthony, MD    Family History Family History  Problem Relation Age of Onset  . Colon cancer Father   . Cervical polyp Sister   . Diabetes Maternal Grandmother   . Diabetes Maternal Grandfather   . Diabetes Paternal Grandmother   . Cervical cancer Paternal Grandmother   . Diabetes Paternal Grandfather   . Colon cancer Paternal Grandfather   . Colon cancer Paternal Aunt   .  Colon cancer Paternal Aunt     Social History Social History   Tobacco Use  . Smoking status: Never Smoker  . Smokeless tobacco: Never Used  Substance Use Topics  . Alcohol use: Yes    Comment: 2-3 times a month.   . Drug use: No     Allergies   Shellfish allergy and Latex   Review of Systems Review of Systems  All other systems reviewed and are negative.    Physical Exam Updated Vital Signs BP 133/81 (BP Location: Right Arm)   Pulse 98   Temp 98.4 F (36.9 C) (Oral)   Resp 18   Ht 5\' 7"  (1.702 m)   Wt 54.4 kg (120 lb)   LMP 07/13/2017   SpO2 100%   BMI 18.79 kg/m   Physical Exam  Constitutional: She is oriented to person, place, and time. She appears well-developed and well-nourished.  HENT:  Head: Normocephalic and atraumatic.  No oral angioedema.  Eyes: Conjunctivae and EOM are normal. Pupils are equal, round, and reactive to light.  Neck: Normal range of motion and phonation normal. Neck supple.  Cardiovascular: Normal rate and regular rhythm.  Pulmonary/Chest: Effort normal and breath sounds normal. No accessory muscle usage or stridor. She exhibits no tenderness.  Abdominal: Soft. She exhibits no distension. There  is no tenderness. There is no guarding.  Musculoskeletal: Normal range of motion.  Neurological: She is alert and oriented to person, place, and time. She exhibits normal muscle tone.  Skin: Skin is warm and dry.  Psychiatric: She has a normal mood and affect. Her behavior is normal. Judgment and thought content normal.  Nursing note and vitals reviewed.    ED Treatments / Results  Labs (all labs ordered are listed, but only abnormal results are displayed) Labs Reviewed  BASIC METABOLIC PANEL - Abnormal; Notable for the following components:      Result Value   Potassium 3.3 (*)    Glucose, Bld 116 (*)    All other components within normal limits  CBC  I-STAT TROPONIN, ED  I-STAT BETA HCG BLOOD, ED ([facility], [facility], [facility] ONLY)    EKG  EKG  Interpretation None       Radiology Dg Chest 2 View  Result Date: 07/31/2017 CLINICAL DATA:  31 y/o  F; chest pain. EXAM: CHEST  2 VIEW COMPARISON:  10/11/2014 chest radiograph FINDINGS: Stable heart size and mediastinal contours are within normal limits. Both lungs are clear. The visualized skeletal structures are unremarkable. IMPRESSION: No acute pulmonary process identified. Electronically Signed   By: Mitzi HansenLance  Furusawa-Stratton M.D.   On: 07/31/2017 21:42    Procedures Procedures (including critical care time)  Medications Ordered in ED Medications - No data to display   Initial Impression / Assessment and Plan / ED Course  I have reviewed the triage vital signs and the nursing notes.  Pertinent labs & imaging results that were available during my care of the patient were reviewed by me and considered in my medical decision making (see chart for details).      Patient Vitals for the past 24 hrs:  BP Temp Temp src Pulse Resp SpO2 Height Weight  07/31/17 2118 133/81 98.4 F (36.9 C) Oral 98 18 100 % - -  07/31/17 2114 - - - - - - 5\' 7"  (1.702 m) 54.4 kg (120 lb)  07/31/17 2113 - - - - - 100 % - -    11:01 PM Reevaluation with update and discussion. After initial assessment and treatment, an updated evaluation reveals no change in clinical status.  Findings discussed with patient, all questions answered. Mancel BaleElliott Tyonna Talerico      Final Clinical Impressions(s) / ED Diagnoses   Final diagnoses:  Allergic reaction, initial encounter   Nonspecific symptoms, stress versus allergic reaction.  No sign of impending respiratory or cardiac collapse.  Nursing Notes Reviewed/ Care Coordinated Applicable Imaging Reviewed Interpretation of Laboratory Data incorporated into ED treatment  The patient appears reasonably screened and/or stabilized for discharge and I doubt any other medical condition or other [facility] requiring further screening, evaluation, or treatment in the ED at this time  prior to discharge.  Plan: Home Medications-continue current medication; Home Treatments-rest, fluids; return here if the recommended treatment, does not improve the symptoms; Recommended follow up-PCP as needed.    ED Discharge Orders    None       Mancel BaleWentz, Karla Pavone, MD 07/31/17 2304

## 2017-08-04 ENCOUNTER — Emergency Department (HOSPITAL_COMMUNITY)
Admission: EM | Admit: 2017-08-04 | Discharge: 2017-08-05 | Disposition: A | Discharge status: Home / Self Care | Attending: Emergency Medicine | Admitting: Emergency Medicine

## 2017-08-04 ENCOUNTER — Other Ambulatory Visit: Payer: Self-pay

## 2017-08-04 ENCOUNTER — Encounter (HOSPITAL_COMMUNITY): Payer: Self-pay | Admitting: Emergency Medicine

## 2017-08-04 DIAGNOSIS — R0789 Other chest pain: Secondary | ICD-10-CM | POA: Diagnosis not present

## 2017-08-04 DIAGNOSIS — Z91040 Latex allergy status: Secondary | ICD-10-CM | POA: Diagnosis not present

## 2017-08-04 DIAGNOSIS — Z79899 Other long term (current) drug therapy: Secondary | ICD-10-CM | POA: Diagnosis not present

## 2017-08-04 NOTE — ED Triage Notes (Signed)
Pt comes in with complaints of jaw and neck tightness. [facility] Originally told it was an allergic reaction to shellfish, [facility] told [facility] it was not. Patient told she has anxiety but patient is adamant that she does not have anxiety and "life is better than it ever has been." Patient being seen to be evaluated for same. Patient requesting CT or MRI.

## 2017-08-05 ENCOUNTER — Encounter (HOSPITAL_COMMUNITY): Payer: Self-pay | Admitting: Radiology

## 2017-08-05 ENCOUNTER — Emergency Department (HOSPITAL_COMMUNITY)

## 2017-08-05 LAB — CBG MONITORING, ED: Glucose-Capillary: 74 mg/dL (ref 65–99)

## 2017-08-05 MED ORDER — IOPAMIDOL (ISOVUE-300) INJECTION 61%
75.0000 mL | Freq: Once | INTRAVENOUS | Status: AC | PRN
  Administered 2017-08-05: 75 mL via INTRAVENOUS

## 2017-08-05 MED ORDER — GI COCKTAIL ~~LOC~~
30.0000 mL | Freq: Once | ORAL | Status: AC
  Administered 2017-08-05: 30 mL via ORAL
  Filled 2017-08-05: qty 30

## 2017-08-05 MED ORDER — IOPAMIDOL (ISOVUE-300) INJECTION 61%
INTRAVENOUS | Status: AC
  Filled 2017-08-05: qty 75

## 2017-08-05 NOTE — Discharge Instructions (Signed)
Keep your scheduled appointments for further evaluation in the outpatient setting.

## 2017-08-05 NOTE — ED Provider Notes (Signed)
[facility]-EMERGENCY DEPT Provider Note   CSN: [id number] Arrival date & time: 08/04/17  1735     History   Chief Complaint Chief Complaint  Patient presents with  . Neck and Jaw Tightness    HPI Amy Reed is a 31 y.o. female.  Patient returns to the emergency department with persistent chest and throat tightness. She was initially seen on 07/29/17 for a possible allergic reaction with symptoms of throat tightness. She was given Pepcid, steroids and benadryl, as well as Epi. She states she was able go home but reports she never felt 100% better. She states the symptoms are worse when she eats or drinks but is able to pass solids and liquids without emesis. She also reports that the tightness she is experiencing is associated with breathing. She returned on 11/27 to [facility], 11[facility]30 [facility] with a visit to an Urgent Care between 11/27 and 11/30. Symptoms are not worsening but not getting any better. Steroids made no difference. She states anxiety was suggested as a diagnosis but she denies any history of anxiety or panic and feels she is not under stress of any kind. She has been referred to GI and to PCP and reports she has made appointments but both are 2 weeks away.    The history is provided by the patient and medical records. No language interpreter was used.    History reviewed. No pertinent past medical history.  Patient Active Problem List   Diagnosis Date Noted  . Candidiasis of vulva and vagina 01/06/2014  . BV (bacterial vaginosis) 01/06/2014  . Vaginal irritation 01/06/2014    Past Surgical History:  Procedure Laterality Date  . DILATION AND CURETTAGE OF UTERUS      OB History    Gravida Para Term Preterm AB Living   2 1   1 1      SAB TAB Ectopic Multiple Live Births   1               Home Medications    Prior to Admission medications   Medication Sig Start Date End Date Taking? Authorizing Provider  cetirizine  (ZYRTEC) 10 MG tablet Take 10 mg by mouth at bedtime. 08/02/17  Yes [provider]  hydrOXYzine (ATARAX/VISTARIL) 50 MG tablet Take 50 mg by mouth 3 (three) times daily as needed for anxiety.  08/02/17  Yes [provider]  potassium chloride (K-DUR,KLOR-CON) 10 MEQ tablet Take 10 mEq by mouth 2 (two) times daily. 08/03/17  Yes [provider]  predniSONE (DELTASONE) 50 MG tablet 1 p.o. daily x3 Patient not taking: Reported on 08/05/2017 07/29/17   Lorre NickAllen, Anthony, MD    Family History Family History  Problem Relation Age of Onset  . Colon cancer Father   . Cervical polyp Sister   . Diabetes Maternal Grandmother   . Diabetes Maternal Grandfather   . Diabetes Paternal Grandmother   . Cervical cancer Paternal Grandmother   . Diabetes Paternal Grandfather   . Colon cancer Paternal Grandfather   . Colon cancer Paternal Aunt   . Colon cancer Paternal Aunt     Social History Social History   Tobacco Use  . Smoking status: Never Smoker  . Smokeless tobacco: Never Used  Substance Use Topics  . Alcohol use: Yes    Comment: 2-3 times a month.   . Drug use: No     Allergies   Shellfish allergy and Latex   Review of Systems Review of Systems  Constitutional: Negative for chills and fever.  HENT: Positive for trouble swallowing. Negative for voice change.        See HPI.  Respiratory: Positive for chest tightness and shortness of breath.   Cardiovascular: Negative.  Negative for chest pain.  Gastrointestinal: Positive for abdominal pain. Negative for vomiting.  Musculoskeletal: Negative.   Skin: Negative.   Neurological: Negative.  Negative for syncope, weakness and light-headedness.     Physical Exam Updated Vital Signs BP (!) 138/98 (BP Location: Right Arm)   Pulse 92   Temp 97.6 F (36.4 C) (Oral)   Resp 16   LMP 07/13/2017 Comment: HCG < 5 07-31-17  SpO2 100%   Physical Exam  Constitutional: She is oriented to person, place, and time.  She appears well-developed and well-nourished.  HENT:  Head: Normocephalic.  Mouth/Throat: Oropharynx is clear and moist.  Neck: Normal range of motion. Neck supple.  Cardiovascular: Normal rate and regular rhythm.  No murmur heard. Pulmonary/Chest: Effort normal and breath sounds normal. No stridor. No respiratory distress. She has no wheezes. She has no rales. She exhibits tenderness.  Abdominal: Soft. Bowel sounds are normal. There is no tenderness. There is no rebound and no guarding.  Musculoskeletal: Normal range of motion.  Neurological: She is alert and oriented to person, place, and time.  Skin: Skin is warm and dry. No rash noted.  Psychiatric: She has a normal mood and affect.     ED Treatments / Results  Labs (all labs ordered are listed, but only abnormal results are displayed) Labs Reviewed  CBG MONITORING, ED    EKG  EKG Interpretation None       Radiology No results found.  Procedures Procedures (including critical care time)  Medications Ordered in ED Medications  iopamidol (ISOVUE-300) 61 % injection 75 mL (not administered)  gi cocktail (Maalox,Lidocaine,Donnatal) (30 mLs Oral Given 08/05/17 0124)     Initial Impression / Assessment and Plan / ED Course  I have reviewed the triage vital signs and the nursing notes.  Pertinent labs & imaging results that were available during my care of the patient were reviewed by me and considered in my medical decision making (see chart for details).     Patient returns to the ED for further evaluation of throat and chest tightness. Difficult to discern if symptoms are more related to breathing or to swallowing, however, she does report worsening tightness with swallowing. GI follow up is felt appropriate.   GI cocktail given without change in symptoms. VSS, no hypoxia. Feel the patient is stable and no emergency medical condition exists.   Discussed that she is following up appropriately. She states she is  concerned about being home with children alone with current symptoms and feels "something is not right". CT chest offered and she would like to have the study done.   CT chest is negative for acute process. Patient can be discharged home to keep already scheduled outpatient appointments with PCP and GI for further evaluation.   Final Clinical Impressions(s) / ED Diagnoses   Final diagnoses:  None   1. Chest tightness  ED Discharge Orders    None       Elpidio AnisUpstill, Varun Jourdan, PA-C 08/05/17 0348    Paula LibraMolpus, John, MD 08/05/17 [phone]

## 2017-08-07 DIAGNOSIS — R053 Chronic cough: Secondary | ICD-10-CM | POA: Insufficient documentation

## 2017-08-07 DIAGNOSIS — R05 Cough: Secondary | ICD-10-CM | POA: Insufficient documentation

## 2017-08-07 DIAGNOSIS — K219 Gastro-esophageal reflux disease without esophagitis: Secondary | ICD-10-CM | POA: Insufficient documentation

## 2017-08-09 ENCOUNTER — Other Ambulatory Visit (HOSPITAL_COMMUNITY): Payer: Self-pay | Admitting: Family Medicine

## 2017-08-09 DIAGNOSIS — R1310 Dysphagia, unspecified: Secondary | ICD-10-CM

## 2017-08-14 ENCOUNTER — Ambulatory Visit (HOSPITAL_COMMUNITY)
Admission: RE | Admit: 2017-08-14 | Discharge: 2017-08-14 | Disposition: A | Discharge status: Home / Self Care | Source: Ambulatory Visit | Attending: Family Medicine | Admitting: Family Medicine

## 2017-08-14 DIAGNOSIS — R1310 Dysphagia, unspecified: Secondary | ICD-10-CM | POA: Diagnosis not present

## 2017-08-15 ENCOUNTER — Ambulatory Visit (HOSPITAL_COMMUNITY)

## 2017-08-23 ENCOUNTER — Encounter: Payer: Self-pay | Admitting: Gastroenterology

## 2017-09-11 ENCOUNTER — Ambulatory Visit (INDEPENDENT_AMBULATORY_CARE_PROVIDER_SITE_OTHER): Admitting: Gastroenterology

## 2017-09-11 ENCOUNTER — Encounter: Payer: Self-pay | Admitting: Gastroenterology

## 2017-09-11 ENCOUNTER — Encounter (INDEPENDENT_AMBULATORY_CARE_PROVIDER_SITE_OTHER): Payer: Self-pay

## 2017-09-11 VITALS — BP 100/70 | HR 76 | Ht 67.0 in | Wt 119.4 lb

## 2017-09-11 DIAGNOSIS — F458 Other somatoform disorders: Secondary | ICD-10-CM | POA: Diagnosis not present

## 2017-09-11 DIAGNOSIS — R1310 Dysphagia, unspecified: Secondary | ICD-10-CM

## 2017-09-11 DIAGNOSIS — Z800 Family history of malignant neoplasm of digestive organs: Secondary | ICD-10-CM | POA: Diagnosis not present

## 2017-09-11 DIAGNOSIS — R09A2 Foreign body sensation, throat: Secondary | ICD-10-CM

## 2017-09-11 DIAGNOSIS — R198 Other specified symptoms and signs involving the digestive system and abdomen: Secondary | ICD-10-CM

## 2017-09-11 DIAGNOSIS — R0989 Other specified symptoms and signs involving the circulatory and respiratory systems: Secondary | ICD-10-CM | POA: Insufficient documentation

## 2017-09-11 MED ORDER — NA SULFATE-K SULFATE-MG SULF 17.5-3.13-1.6 GM/177ML PO SOLN: 1.0000 | ORAL | 0 refills | Status: DC

## 2017-09-11 NOTE — Patient Instructions (Signed)

## 2017-09-11 NOTE — Progress Notes (Signed)
09/11/2017 Amy Reed [id number] [birth year]   HISTORY OF PRESENT ILLNESS:  This is a pleasant 32 year old female who is new to our practice.  She has been referred here by her PCP, Dr. Parke Simmers, for evaluation of difficulty swallowing.  She tells me that for the past couple of months she has been having a lot of problems swallowing.  She says that on 07/29/2017 she had what she thought was an allergic reaction, with throat tightness, to some shellfish and went to the ED.  Since that time she's been having this issue with swallowing.  Mostly happens with solid food, feels like it gets hung up every time that she eats.  But also says that she always feels like there is something in her throat/upper esophagus.  She saw ENT and they told her that everything looked fine, but may be reflux so they placed her on omeprazole 40 mg daily, which has been on for about 4 weeks now without any improvement.  Her PCP is sending her to another ENT at [facility] for a second opinion as well.  She saw an allergist yesterday, but other than confirming some allergies they did not say much.  She had an esophagram that was normal.  She says that she has lost about 15 pounds since this started because she just tries to avoid eating.  While she is here she also tells me that she has a family history of colon cancer in her father who was initially diagnosed when he was in his 43's and he "has had colon cancer 4 times".  Also reports colon cancer in 2 paternal aunts/uncles.  Reports that brothers and sisters have had polyps removed.  Admits to some constipation but no rectal bleeding or other bowel issues.   No past medical history on file. Past Surgical History:  Procedure Laterality Date  . DILATION AND CURETTAGE OF UTERUS      reports that  has never smoked. she has never used smokeless tobacco. She reports that she drinks alcohol. She reports that she does not use drugs. family history includes Cervical cancer in her  paternal grandmother; Cervical polyp in her sister; Colon cancer in her father, paternal aunt, paternal aunt, and paternal grandfather; Diabetes in her maternal grandfather, maternal grandmother, paternal grandfather, and paternal grandmother. Allergies  Allergen Reactions  . Shellfish Allergy Anaphylaxis  . Latex Itching      Outpatient Encounter Medications as of 09/11/2017  Medication Sig  . cetirizine (ZYRTEC) 10 MG tablet Take 10 mg by mouth at bedtime.  Marland Kitchen omeprazole (PRILOSEC) 40 MG capsule Take 1 capsule by mouth daily.  . [DISCONTINUED] hydrOXYzine (ATARAX/VISTARIL) 50 MG tablet Take 50 mg by mouth 3 (three) times daily as needed for anxiety.   . [DISCONTINUED] potassium chloride (K-DUR,KLOR-CON) 10 MEQ tablet Take 10 mEq by mouth 2 (two) times daily.  . [DISCONTINUED] predniSONE (DELTASONE) 50 MG tablet 1 p.o. daily x3 (Patient not taking: Reported on 08/05/2017)   No facility-administered encounter medications on file as of 09/11/2017.      REVIEW OF SYSTEMS  : All other systems reviewed and negative except where noted in the History of Present Illness.   PHYSICAL EXAM: BP 100/70   Pulse 76   Ht 5\' 7"  (1.702 m)   Wt 119 lb 6.4 oz (54.2 kg)   LMP 08/30/2017 (Approximate)   BMI 18.70 kg/m  General: Well developed black female in no acute distress Head: Normocephalic and atraumatic Eyes:  Sclerae anicteric,  conjunctiva pink. Ears: Normal auditory acuity Lungs: Clear throughout to auscultation; no increased WOB. Heart: Regular rate and rhythm; no M/R/G. Abdomen: Soft, non-distended.  BS present.  Non-tender. Rectal:  Will be done at the time of colonoscopy. Musculoskeletal: Symmetrical with no gross deformities  Skin: No lesions on visible extremities Extremities: No edema  Neurological: Alert oriented x 4, grossly non-focal Psychological:  Alert and cooperative. Normal mood and affect  ASSESSMENT AND PLAN: *Family history of colon cancer:  Patient claims to have family  history of colon cancer in her father (4 times, first diagnosed in his 4430's) and 2 paternal aunts/uncles.  Also has brothers and sisters who have had polyps.  Will be scheduled for colonoscopy with Dr. Adela LankArmbruster. *Dysphagia/globus sensation:  Normal esophagram.  Has seen allergist and ENT.  Has been on omeprazole 40 mg daily without improvement.  ? EoE.  Will schedule for EGD with Dr. Adela LankArmbruster.  **The risks, benefits, and alternatives to EGD with possible dilation and colonoscopy were discussed with the patient and she consents to proceed.    CC:  No ref. provider found

## 2017-09-12 NOTE — Progress Notes (Signed)
Agree with assessment and plan as outlined.  

## 2017-09-14 ENCOUNTER — Other Ambulatory Visit: Payer: Self-pay

## 2017-09-14 ENCOUNTER — Encounter: Payer: Self-pay | Admitting: Gastroenterology

## 2017-09-14 ENCOUNTER — Ambulatory Visit (AMBULATORY_SURGERY_CENTER): Admitting: Gastroenterology

## 2017-09-14 VITALS — BP 121/78 | HR 77 | Temp 98.9°F | Resp 15 | Ht 67.0 in | Wt 119.0 lb

## 2017-09-14 DIAGNOSIS — Z1211 Encounter for screening for malignant neoplasm of colon: Secondary | ICD-10-CM

## 2017-09-14 DIAGNOSIS — R0989 Other specified symptoms and signs involving the circulatory and respiratory systems: Secondary | ICD-10-CM

## 2017-09-14 DIAGNOSIS — Z800 Family history of malignant neoplasm of digestive organs: Secondary | ICD-10-CM | POA: Diagnosis not present

## 2017-09-14 DIAGNOSIS — F458 Other somatoform disorders: Secondary | ICD-10-CM

## 2017-09-14 DIAGNOSIS — R1319 Other dysphagia: Secondary | ICD-10-CM

## 2017-09-14 DIAGNOSIS — R1310 Dysphagia, unspecified: Secondary | ICD-10-CM | POA: Diagnosis not present

## 2017-09-14 DIAGNOSIS — R198 Other specified symptoms and signs involving the digestive system and abdomen: Secondary | ICD-10-CM

## 2017-09-14 MED ORDER — SODIUM CHLORIDE 0.9 % IV SOLN: 500.0000 mL | Freq: Once | INTRAVENOUS | Status: DC

## 2017-09-14 NOTE — Op Note (Signed)
[facility] Patient Name: Amy Reed Procedure Date: 09/14/2017 11:18 AM MRN: [id number] Endoscopist: Viviann Spare P. Aidah Forquer MD, MD Age: 32 Referring MD:  Date of Birth: September 24, 1985 Gender: Female Account #: [id number] Procedure:                Colonoscopy Indications:              Screening in patient at increased risk: Family                            history of 1st-degree relative with colorectal                            cancer (Father diagnosed age 60s) Medicines:                Monitored Anesthesia Care Procedure:                Pre-Anesthesia Assessment:                           - Prior to the procedure, a History and Physical                            was performed, and patient medications and                            allergies were reviewed. The patient's tolerance of                            previous anesthesia was also reviewed. The risks                            and benefits of the procedure and the sedation                            options and risks were discussed with the patient.                            All questions were answered, and informed consent                            was obtained. Prior Anticoagulants: The patient has                            taken no previous anticoagulant or antiplatelet                            agents. ASA Grade Assessment: II - A patient with                            mild systemic disease. After reviewing the risks                            and benefits, the patient was deemed in  satisfactory condition to undergo the procedure.                           After obtaining informed consent, the colonoscope                            was passed under direct vision. Throughout the                            procedure, the patient's blood pressure, pulse, and                            oxygen saturations were monitored continuously. The                            Colonoscope was  introduced through the anus and                            advanced to the the cecum, identified by                            appendiceal orifice and ileocecal valve. The                            colonoscopy was performed without difficulty. The                            patient tolerated the procedure well. The quality                            of the bowel preparation was good. The ileocecal                            valve, appendiceal orifice, and rectum were                            photographed. Scope In: 11:37:24 AM Scope Out: 11:51:42 AM Scope Withdrawal Time: 0 hours 11 minutes 36 seconds  Total Procedure Duration: 0 hours 14 minutes 18 seconds  Findings:                 The perianal and digital rectal examinations were                            normal.                           The entire examined colon appeared normal on direct                            and retroflexion views. No polyps. Complications:            No immediate complications. Estimated blood loss:                            None. Estimated Blood Loss:  Estimated blood loss: none. Impression:               - The entire examined colon is normal on direct and                            retroflexion views.                           - No polyps Recommendation:           - Patient has a contact number available for                            emergencies. The signs and symptoms of potential                            delayed complications were discussed with the                            patient. Return to normal activities tomorrow.                            Written discharge instructions were provided to the                            patient.                           - Resume previous diet.                           - Continue present medications.                           - Repeat colonoscopy in 5 years for screening                            purposes given family history of colon cancer Grainger Mccarley  P. Meadow Abramo MD, MD 09/14/2017 11:54:51 AM This report has been signed electronically.

## 2017-09-14 NOTE — Progress Notes (Signed)
Pt's states no medical or surgical changes since previsit or office visit. 

## 2017-09-14 NOTE — Progress Notes (Signed)
Called to room to assist during endoscopic procedure.  Patient ID and intended procedure confirmed with present staff. Received instructions for my participation in the procedure from the performing physician.  

## 2017-09-14 NOTE — Op Note (Signed)
[facility] Patient Name: Amy Reed Procedure Date: 09/14/2017 11:19 AM MRN: [id number] Endoscopist: Viviann Spare P. Armbruster MD, MD Age: 32 Referring MD:  Date of Birth: [birth year] Gender: Female Account #: [id number] Procedure:                Upper GI endoscopy Indications:              Dysphagia, Globus sensation - no improvement with                            trial of PPI. ENT evaluation without clear                            etiology. Normal esophogram. Medicines:                Monitored Anesthesia Care Procedure:                Pre-Anesthesia Assessment:                           - Prior to the procedure, a History and Physical                            was performed, and patient medications and                            allergies were reviewed. The patient's tolerance of                            previous anesthesia was also reviewed. The risks                            and benefits of the procedure and the sedation                            options and risks were discussed with the patient.                            All questions were answered, and informed consent                            was obtained. Prior Anticoagulants: The patient has                            taken no previous anticoagulant or antiplatelet                            agents. ASA Grade Assessment: II - A patient with                            mild systemic disease. After reviewing the risks                            and benefits, the patient was deemed in  satisfactory condition to undergo the procedure.                           After obtaining informed consent, the endoscope was                            passed under direct vision. Throughout the                            procedure, the patient's blood pressure, pulse, and                            oxygen saturations were monitored continuously. The                            Endoscope was  introduced through the mouth, and                            advanced to the second part of duodenum. The upper                            GI endoscopy was accomplished without difficulty.                            The patient tolerated the procedure well. Scope In: Scope Out: Findings:                 Esophagogastric landmarks were identified: the                            Z-line was found at 35 cm, the gastroesophageal                            junction was found at 35 cm and the upper extent of                            the gastric folds was found at 35 cm from the                            incisors.                           The exam of the esophagus was otherwise normal. No                            stenosis / stricture was noted. No overt                            inflammatory changes.                           Biopsies were taken with a cold forceps in the  upper third of the esophagus, in the middle third                            of the esophagus and in the lower third of the                            esophagus for histology, to rule out eosinophilic                            esophagitis.                           The entire examined stomach was normal.                           The duodenal bulb and second portion of the                            duodenum were normal. Complications:            No immediate complications. Estimated blood loss:                            Minimal. Estimated Blood Loss:     Estimated blood loss was minimal. Impression:               - Esophagogastric landmarks identified.                           - Normal esophagus otherwise as outlined above -                            biopsies taken to rule out eosinophilic                            esophagitis, but seems unlikely.                           - Normal stomach.                           - Normal duodenal bulb and second portion of the                             duodenum. Recommendation:           - Patient has a contact number available for                            emergencies. The signs and symptoms of potential                            delayed complications were discussed with the                            patient. Return to normal activities tomorrow.  Written discharge instructions were provided to the                            patient.                           - Resume previous diet.                           - Continue present medications.                           - Await pathology results.                           - If pathology results negative and symptoms                            persist, consider esophageal manometry to assess                            for a motility disorder Viviann Spare P. Armbruster MD, MD 09/14/2017 12:00:30 PM This report has been signed electronically.

## 2017-09-14 NOTE — Progress Notes (Signed)
To PACU, VSS. Report to RN.tb 

## 2017-09-14 NOTE — Patient Instructions (Signed)
Impression/Recommendations:  Resume previous diet. Continue present medications.  Repeat colonoscopy in 5 years for screening purposes.  YOU HAD AN ENDOSCOPIC PROCEDURE TODAY AT THE [facility]:   Refer to the procedure report that was given to you for any specific questions about what was found during the examination.  If the procedure report does not answer your questions, please call your gastroenterologist to clarify.  If you requested that your care partner not be given the details of your procedure findings, then the procedure report has been included in a sealed envelope for you to review at your convenience later.  YOU SHOULD EXPECT: Some feelings of bloating in the abdomen. Passage of more gas than usual.  Walking can help get rid of the air that was put into your GI tract during the procedure and reduce the bloating. If you had a lower endoscopy (such as a colonoscopy or flexible sigmoidoscopy) you may notice spotting of blood in your stool or on the toilet paper. If you underwent a bowel prep for your procedure, you may not have a normal bowel movement for a few days.  Please Note:  You might notice some irritation and congestion in your nose or some drainage.  This is from the oxygen used during your procedure.  There is no need for concern and it should clear up in a day or so.  SYMPTOMS TO REPORT IMMEDIATELY:   Following lower endoscopy (colonoscopy or flexible sigmoidoscopy):  Excessive amounts of blood in the stool  Significant tenderness or worsening of abdominal pains  Swelling of the abdomen that is new, acute  Fever of 100F or higher   Following upper endoscopy (EGD)  Vomiting of blood or coffee ground material  New chest pain or pain under the shoulder blades  Painful or persistently difficult swallowing  New shortness of breath  Fever of 100F or higher  Black, tarry-looking stools  For urgent or emergent issues, a gastroenterologist can be reached  at any hour by calling [phone].   DIET:  We do recommend a small meal at first, but then you may proceed to your regular diet.  Drink plenty of fluids but you should avoid alcoholic beverages for 24 hours.  ACTIVITY:  You should plan to take it easy for the rest of today and you should NOT DRIVE or use heavy machinery until tomorrow (because of the sedation medicines used during the test).    FOLLOW UP: Our staff will call the number listed on your records the next business day following your procedure to check on you and address any questions or concerns that you may have regarding the information given to you following your procedure. If we do not reach you, we will leave a message.  However, if you are feeling well and you are not experiencing any problems, there is no need to return our call.  We will assume that you have returned to your regular daily activities without incident.  If any biopsies were taken you will be contacted by phone or by letter within the next 1-3 weeks.  Please call us at [phone] if you have not heard about the biopsies in 3 weeks.    SIGNATURES/CONFIDENTIALITY: You and/or your care partner have signed paperwork which will be entered into your electronic medical record.  These signatures attest to the fact that that the information above on your After Visit Summary has been reviewed and is understood.  Full responsibility of the confidentiality of this discharge information  lies with you and/or your care-partner.

## 2017-09-16 ENCOUNTER — Emergency Department (HOSPITAL_BASED_OUTPATIENT_CLINIC_OR_DEPARTMENT_OTHER)
Admission: EM | Admit: 2017-09-16 | Discharge: 2017-09-17 | Disposition: A | Discharge status: Home / Self Care | Attending: Emergency Medicine | Admitting: Emergency Medicine

## 2017-09-16 ENCOUNTER — Other Ambulatory Visit: Payer: Self-pay

## 2017-09-16 ENCOUNTER — Encounter (HOSPITAL_BASED_OUTPATIENT_CLINIC_OR_DEPARTMENT_OTHER): Payer: Self-pay | Admitting: *Deleted

## 2017-09-16 DIAGNOSIS — T782XXA Anaphylactic shock, unspecified, initial encounter: Secondary | ICD-10-CM

## 2017-09-16 DIAGNOSIS — Z79899 Other long term (current) drug therapy: Secondary | ICD-10-CM | POA: Insufficient documentation

## 2017-09-16 DIAGNOSIS — Z91040 Latex allergy status: Secondary | ICD-10-CM | POA: Insufficient documentation

## 2017-09-16 DIAGNOSIS — T7840XA Allergy, unspecified, initial encounter: Secondary | ICD-10-CM | POA: Diagnosis present

## 2017-09-16 DIAGNOSIS — J45909 Unspecified asthma, uncomplicated: Secondary | ICD-10-CM | POA: Diagnosis not present

## 2017-09-16 HISTORY — DX: Other specified personal risk factors, not elsewhere classified: Z91.89

## 2017-09-16 MED ORDER — DIPHENHYDRAMINE HCL 50 MG/ML IJ SOLN
25.0000 mg | Freq: Once | INTRAMUSCULAR | Status: AC
  Administered 2017-09-16: 25 mg via INTRAVENOUS
  Filled 2017-09-16: qty 1

## 2017-09-16 MED ORDER — EPINEPHRINE 0.3 MG/0.3ML IJ SOAJ
0.3000 mg | Freq: Once | INTRAMUSCULAR | Status: AC
  Administered 2017-09-16: 0.3 mg via INTRAMUSCULAR
  Filled 2017-09-16: qty 0.3

## 2017-09-16 MED ORDER — FAMOTIDINE IN NACL 20-0.9 MG/50ML-% IV SOLN
20.0000 mg | Freq: Once | INTRAVENOUS | Status: AC
  Administered 2017-09-16: 20 mg via INTRAVENOUS
  Filled 2017-09-16: qty 50

## 2017-09-16 NOTE — ED Notes (Signed)
RN and RRT to room. Pt appears in NAD. No oral swelling appreciated inside of mouth, none felt on throat.

## 2017-09-16 NOTE — ED Triage Notes (Signed)
Pt reports issues with her throat and multiple allergies including seafood. Tonight while eating she states her throat began to feel tight (approx 10 mins pta). Pt has her epi-pen with her but has not used it. Took 5mg  zyrtec pta. Pt also had and endosccopy on Friday to evaluate for throat issues and allergies.

## 2017-09-16 NOTE — ED Notes (Signed)
Family reports pt's throat getting tighter, RN and EDP notified[facility]

## 2017-09-17 ENCOUNTER — Telehealth: Payer: Self-pay

## 2017-09-17 MED ORDER — DIPHENHYDRAMINE HCL 25 MG PO TABS: 25.0000 mg | ORAL_TABLET | Freq: Four times a day (QID) | ORAL | 0 refills | Status: DC | PRN

## 2017-09-17 MED ORDER — FAMOTIDINE 20 MG PO TABS: 20.0000 mg | ORAL_TABLET | Freq: Two times a day (BID) | ORAL | 0 refills | Status: DC

## 2017-09-17 NOTE — Telephone Encounter (Signed)
  Follow up Call-  Call back number 09/14/2017  Post procedure Call Back phone  # [phone]  Permission to leave phone message Yes  Some recent data might be hidden     Patient questions:  Do you have a fever, pain , or abdominal swelling? No. Pain Score  0 *  Have you tolerated food without any problems? Yes.    Have you been able to return to your normal activities? Yes.    Do you have any questions about your discharge instructions: Diet   No. Medications  No. Follow up visit  No.  Do you have questions or concerns about your Care? No.  Actions: * If pain score is 4 or above: No action needed, pain <4.

## 2017-09-17 NOTE — ED Provider Notes (Signed)
1:22 AM  Signed out pending recheck to ensure no rebound reaction.  On recheck, patient is resting comfortably.  No acute complaints.  Denies any recurrence of symptoms.  Will be discharged home per Dr. Burr MedicoSchlossman's discharge instructions.   Shon BatonHorton, Courtney F, MD 09/17/17 [phone]

## 2017-09-17 NOTE — ED Provider Notes (Signed)
[facility] EMERGENCY DEPARTMENT Provider Note   CSN: [id number] Arrival date & time: 09/16/17  2021     History   Chief Complaint Chief Complaint  Patient presents with  . Allergic Reaction    HPI Amy Reed is a 32 y.o. female.  HPI   32yo female with historyof allergies and anaphylactic reactions for which she has seen allergist presents with concern for throat swelling, shortness of breath, nausea and itching after eating dinner tonight. Reports she had spaghetti and salad and shortly after developed these symptoms.  Symptoms began 10 min prior to arrival. Throat feels tight and worsening.  Nausea no vomiting. No abd pain or diarrhea.  Has epi pen but has not used it.    Past Medical History:  Diagnosis Date  . Allergy   . Asthma   . GERD (gastroesophageal reflux disease)   . History of multiple allergies     Patient Active Problem List   Diagnosis Date Noted  . Family hx of colon cancer requiring screening colonoscopy 09/11/2017  . Dysphagia 09/11/2017  . Globus sensation 09/11/2017  . Candidiasis of vulva and vagina 01/06/2014  . BV (bacterial vaginosis) 01/06/2014  . Vaginal irritation 01/06/2014    Past Surgical History:  Procedure Laterality Date  . DILATION AND CURETTAGE OF UTERUS      OB History    Gravida Para Term Preterm AB Living   2 1   1 1      SAB TAB Ectopic Multiple Live Births   1               Home Medications    Prior to Admission medications   Medication Sig Start Date End Date Taking? Authorizing Provider  albuterol (PROVENTIL HFA;VENTOLIN HFA) 108 (90 Base) MCG/ACT inhaler Inhale 2 puffs into the lungs every 6 (six) hours as needed for wheezing or shortness of breath.   Yes [provider]  cetirizine (ZYRTEC) 10 MG tablet Take 10 mg by mouth at bedtime. 08/02/17  Yes [provider]  fluticasone (FLOVENT HFA) 110 MCG/ACT inhaler Inhale 2 puffs into the lungs 2 (two) times daily.   Yes [provider]  omeprazole (PRILOSEC) 40 MG capsule Take 1 capsule by mouth daily. 08/07/17  Yes [provider]  diphenhydrAMINE (BENADRYL) 25 MG tablet Take 1 tablet (25 mg total) by mouth every 6 (six) hours as needed for allergies. 09/17/17   Alvira Monday, MD  famotidine (PEPCID) 20 MG tablet Take 1 tablet (20 mg total) by mouth 2 (two) times daily for 3 days. 09/17/17 09/20/17  Alvira Monday, MD    Family History Family History  Problem Relation Age of Onset  . Colon cancer Father   . Cervical polyp Sister   . Diabetes Maternal Grandmother   . Diabetes Maternal Grandfather   . Diabetes Paternal Grandmother   . Cervical cancer Paternal Grandmother   . Diabetes Paternal Grandfather   . Colon cancer Paternal Grandfather   . Colon cancer Paternal Aunt   . Colon cancer Paternal Aunt   . Esophageal cancer Neg Hx   . Stomach cancer Neg Hx   . Rectal cancer Neg Hx     Social History Social History   Tobacco Use  . Smoking status: Never Smoker  . Smokeless tobacco: Never Used  Substance Use Topics  . Alcohol use: Yes    Comment: 2-3 times a month.   . Drug use: No     Allergies   Shellfish allergy and  Latex   Review of Systems Review of Systems  Constitutional: Negative for fever.  HENT: Positive for trouble swallowing and voice change. Negative for sore throat.   Eyes: Negative for visual disturbance.  Respiratory: Positive for shortness of breath. Negative for cough.   Cardiovascular: Negative for chest pain.  Gastrointestinal: Positive for nausea. Negative for abdominal pain and vomiting.  Genitourinary: Negative for difficulty urinating.  Musculoskeletal: Negative for back pain and neck pain.  Skin: Negative for rash.  Neurological: Negative for syncope and headaches.     Physical Exam Updated Vital Signs BP 108/81   Pulse 79   Temp 98.4 F (36.9 C) (Oral)   Resp 15   Ht 5\' 7"  (1.702 m)   Wt 54 kg (119 lb)   LMP 08/30/2017 (Approximate)    SpO2 100%   BMI 18.64 kg/m   Physical Exam  Constitutional: She is oriented to person, place, and time. She appears well-developed and well-nourished. No distress.  HENT:  Head: Normocephalic and atraumatic.  Mouth/Throat: No oropharyngeal exudate.  Eyes: Conjunctivae and EOM are normal.  Neck: Normal range of motion.  Cardiovascular: Normal rate, regular rhythm, normal heart sounds and intact distal pulses. Exam reveals no gallop and no friction rub.  No murmur heard. Pulmonary/Chest: Effort normal and breath sounds normal. No stridor. No respiratory distress. She has no wheezes. She has no rales.  Abdominal: Soft. She exhibits no distension. There is no tenderness. There is no guarding.  Musculoskeletal: She exhibits no edema or tenderness.  Neurological: She is alert and oriented to person, place, and time.  Skin: Skin is warm and dry. No rash noted. She is not diaphoretic. No erythema.  Nursing note and vitals reviewed.    ED Treatments / Results  Labs (all labs ordered are listed, but only abnormal results are displayed) Labs Reviewed - No data to display  EKG  EKG Interpretation None       Radiology No results found.  Procedures Procedures (including critical care time)  Medications Ordered in ED Medications  diphenhydrAMINE (BENADRYL) injection 25 mg (25 mg Intravenous Given 09/16/17 2134)  famotidine (PEPCID) IVPB 20 mg premix (0 mg Intravenous Stopped 09/16/17 2203)  EPINEPHrine (EPI-PEN) injection 0.3 mg (0.3 mg Intramuscular Given 09/16/17 2133)     Initial Impression / Assessment and Plan / ED Course  I have reviewed the triage vital signs and the nursing notes.  Pertinent labs & imaging results that were available during my care of the patient were reviewed by me and considered in my medical decision making (see chart for details).     32yo female with historyof allergies and anaphylactic reactions for which she has seen allergist presents with concern  for throat swelling, shortness of breath, nausea and itching after eating dinner tonight.  No wheezing, stridor, or oropharyngeal edema on exam, however symptoms patient described are consistent with likely anaphylaxis.  She is given 0.3 mg of IM epinephrine, Benadryl and famotidine.  Patient declined steroids given fear for possible side effects.  Discussed recommendation for steroids with patient, however she continues to decline.  She reports improvement after treatment.  Will observe in the emergency department for 4 hours following administration of epinephrine.  She reports that she has an EpiPen at home, and does not with steroids.  Will provide prescription for famotidine and Benadryl, plan to discharge if no rebound symptoms.   Final Clinical Impressions(s) / ED Diagnoses   Final diagnoses:  Anaphylaxis, initial encounter    ED Discharge Orders  Ordered    famotidine (PEPCID) 20 MG tablet  2 times daily     09/17/17 0031    diphenhydrAMINE (BENADRYL) 25 MG tablet  Every 6 hours PRN     09/17/17 0031       Alvira Monday, MD 09/17/17 [phone]

## 2017-09-18 ENCOUNTER — Telehealth: Payer: Self-pay | Admitting: Gastroenterology

## 2017-09-18 NOTE — Telephone Encounter (Signed)
Entered in error

## 2017-09-18 NOTE — Telephone Encounter (Signed)
Let patient know that results are not ready yet. She wanted to let us know she is having throat and chest tightness. Denies any arm, facial numbness. She is taking omeprazole 40 mg at dinner time.

## 2017-09-18 NOTE — Telephone Encounter (Signed)
Patient wants to know when results from procedure on 1.11.19 will be back. And pt also states she is having a tightness in her chest and wants to know if this is normal.

## 2017-09-18 NOTE — Telephone Encounter (Signed)
Patient advised to follow up with her PCP re: chest tightness. Will let her know when we have the results.

## 2017-09-18 NOTE — Telephone Encounter (Signed)
Amy Reed it appears she was seen in the ER for an allergic reaction on 09/16/16 after potentially eating something she was allergic to?, she has a history of severe allergies. She should follow up with her primary care regarding her chest tightness, I don't think related to her swallowing difficulty or procedure with me.   I should have results for her within 1-2 weeks, she will be contacted when they have been reviewed.

## 2017-09-19 ENCOUNTER — Emergency Department (HOSPITAL_BASED_OUTPATIENT_CLINIC_OR_DEPARTMENT_OTHER)
Admission: EM | Admit: 2017-09-19 | Discharge: 2017-09-19 | Disposition: A | Discharge status: Home / Self Care | Attending: Emergency Medicine | Admitting: Emergency Medicine

## 2017-09-19 ENCOUNTER — Other Ambulatory Visit: Payer: Self-pay

## 2017-09-19 ENCOUNTER — Encounter (HOSPITAL_BASED_OUTPATIENT_CLINIC_OR_DEPARTMENT_OTHER): Payer: Self-pay | Admitting: Emergency Medicine

## 2017-09-19 DIAGNOSIS — R0602 Shortness of breath: Secondary | ICD-10-CM | POA: Insufficient documentation

## 2017-09-19 DIAGNOSIS — Z5321 Procedure and treatment not carried out due to patient leaving prior to being seen by health care provider: Secondary | ICD-10-CM | POA: Diagnosis not present

## 2017-09-19 NOTE — ED Notes (Signed)
Pt not in room and seen leaving

## 2017-09-19 NOTE — ED Triage Notes (Signed)
Pt presents with shortness of breath that started tonight while having intercourse tonight. Pt was seen at her allergy doc today and given script for prednisone but states pharmacy closed before she could pick up med. Pt was here Sunday for allergic reaction

## 2017-09-20 ENCOUNTER — Other Ambulatory Visit: Payer: Self-pay

## 2017-09-20 DIAGNOSIS — R1310 Dysphagia, unspecified: Secondary | ICD-10-CM

## 2017-10-03 ENCOUNTER — Ambulatory Visit (INDEPENDENT_AMBULATORY_CARE_PROVIDER_SITE_OTHER): Admitting: Family Medicine

## 2017-10-03 ENCOUNTER — Encounter: Payer: Self-pay | Admitting: Family Medicine

## 2017-10-03 ENCOUNTER — Other Ambulatory Visit: Payer: Self-pay

## 2017-10-03 VITALS — BP 118/72 | HR 100 | Temp 98.3°F | Resp 16 | Ht 67.0 in | Wt 120.4 lb

## 2017-10-03 DIAGNOSIS — R05 Cough: Secondary | ICD-10-CM

## 2017-10-03 DIAGNOSIS — K219 Gastro-esophageal reflux disease without esophagitis: Secondary | ICD-10-CM | POA: Diagnosis not present

## 2017-10-03 DIAGNOSIS — Z23 Encounter for immunization: Secondary | ICD-10-CM

## 2017-10-03 DIAGNOSIS — F458 Other somatoform disorders: Secondary | ICD-10-CM | POA: Diagnosis not present

## 2017-10-03 DIAGNOSIS — R053 Chronic cough: Secondary | ICD-10-CM

## 2017-10-03 DIAGNOSIS — R1310 Dysphagia, unspecified: Secondary | ICD-10-CM | POA: Diagnosis not present

## 2017-10-03 DIAGNOSIS — R0989 Other specified symptoms and signs involving the circulatory and respiratory systems: Secondary | ICD-10-CM

## 2017-10-03 DIAGNOSIS — R198 Other specified symptoms and signs involving the digestive system and abdomen: Secondary | ICD-10-CM

## 2017-10-03 NOTE — Progress Notes (Signed)
Chief Complaint  Patient presents with  . Gastroesophageal Reflux    pt wanting 2nd opinion in regards to testing(tube being placed in nose for 24 hrs), chest pain-tight and hard to breath, dx of allergy to shellfish in Nov.  Per doctor esphogas damaged from acid reflux but per pt she hasn't ever felt it so they told her it maybe happening while she is asleep.  Per pt she has developed anxiety due to all the testing and no definitive answers to whats wrong.  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  per pt has lost 15 lbs since Nov.    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 She does not smoke cigarettes, cigars or marijuana She stopped all alcohol since onset in November 2018 She was never a heavy drinker She has lost 15 pounds due to low energy Wt Readings from Last 3 Encounters:  10/03/17 120 lb 6.4 oz (54.6 kg)  09/19/17 118 lb (53.5 kg)  09/16/17 119 lb (54 kg)   She reports that she has been having symptoms of right side chest burning sensation, throat tightness, heaviness in her chest since November 2018 She has had 5 ER visit, barium swallow, endoscopy and colonoscopy due to her family history of colon cancer Her work up so far has been negative and her PPI is not helping She is scheduled for 24 hour pH impedence testing and esophageal manometry  She has to stop the PPI for her testing and she is concerned as to what will happen off the medication. 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 Allergy   . Asthma   . GERD (gastroesophageal reflux disease)   . History of multiple allergies     Current Outpatient Medications  Medication Sig Dispense Refill  . albuterol (PROVENTIL HFA;VENTOLIN HFA) 108 (90 Base) MCG/ACT inhaler Inhale 2 puffs into the lungs every 6 (six) hours as needed for wheezing or shortness of breath.    . budesonide-formoterol (SYMBICORT) 160-4.5 MCG/ACT inhaler Inhale 2 puffs into the lungs 2 (two) times daily.    . cetirizine (ZYRTEC) 10 MG tablet Take 10 mg by mouth at bedtime.  0  . omeprazole (PRILOSEC) 40 MG capsule Take 1 capsule by mouth daily.  11   Current Facility-Administered Medications  Medication Dose Route Frequency Provider Last Rate Last Dose  . 0.9 %  sodium chloride infusion  500 mL Intravenous Once Armbruster, Willaim Rayas, MD        Allergies:  Allergies  Allergen Reactions  . Shellfish Allergy Anaphylaxis  . Latex Itching    Past Surgical History:  Procedure Laterality Date  . DILATION AND CURETTAGE OF UTERUS  Social History   Socioeconomic History  . Marital status: Divorced    Spouse name: None  . Number of children: None  . Years of education: None  . Highest education level: None  Social Needs  . Financial resource strain: None  . Food insecurity - worry: None  . Food insecurity - inability: None  . Transportation needs - medical: None  . Transportation needs - non-medical: None  Occupational History  . None  Tobacco Use  . Smoking status: Never Smoker  . Smokeless tobacco: Never Used  Substance and Sexual Activity  . Alcohol use: Yes    Comment: 2-3 times a month.   . Drug use: No  . Sexual activity: Yes    Partners: Female    Birth control/protection: None  Other Topics Concern  . None  Social  History Narrative  . None    Family History  Problem Relation Age of Onset  . Colon cancer Father   . Cervical polyp Sister   . Diabetes Maternal Grandmother   . Diabetes Maternal Grandfather   . Diabetes Paternal Grandmother   . Cervical cancer Paternal Grandmother   . Diabetes Paternal Grandfather   . Colon cancer Paternal Grandfather   . Colon cancer Paternal Aunt   . Colon cancer Paternal Aunt   . Esophageal cancer Neg Hx   . Stomach cancer Neg Hx   . Rectal cancer Neg Hx      ROS Review of Systems See HPI Constitution: No fevers or chills No malaise No diaphoresis Skin: No rash or itching Eyes: no blurry vision, no double vision GU: no dysuria or hematuria Neuro: no dizziness or headaches  all others reviewed and negative   Objective: Vitals:   10/03/17 1426  BP: 118/72  Pulse: 100  Resp: 16  Temp: 98.3 F (36.8 C)  TempSrc: Oral  SpO2: 98%  Weight: 120 lb 6.4 oz (54.6 kg)  Height: 5\' 7"  (1.702 m)    Physical Exam General: alert, oriented, in NAD Head: normocephalic, atraumatic, no sinus tenderness Eyes: EOM intact, no scleral icterus or conjunctival injection Ears: TM clear bilaterally Nose: mucosa nonerythematous, nonedematous Throat: no pharyngeal exudate or erythema Lymph: no posterior auricular, submental or cervical lymph adenopathy Heart: normal rate, normal sinus rhythm, no murmurs Lungs: clear to auscultation bilaterally, no wheezing   Assessment and Plan Amy Reed was seen today for gastroesophageal reflux.  Diagnoses and all orders for this visit:  Dysphagia, unspecified type  Gastroesophageal reflux disease without esophagitis  Cough, persistent  Globus sensation  After reviewing records from GI and ENT Discussed with patient the planned procedures Discussed current symptoms Advise pt to follow up and to continue her plans for vacation She is very hesitant to take meds for anxiety Keep travel plans as this can be a source of  stress relief  Need for flu vaccine -     Flu Vaccine QUAD 36+ mos IM   A total of 35 minutes were spent face-to-face with the patient during this encounter and over half of that time was spent on counseling and coordination of care.   Amy Reed

## 2017-10-09 NOTE — Progress Notes (Signed)
Called and confirmed appointment with patient for 1030 tomorrow for esophageal manometry and Ph testing. Pt confirmed she would be there and not eat within 6 hours of test.

## 2017-10-10 ENCOUNTER — Ambulatory Visit (HOSPITAL_COMMUNITY)
Admission: RE | Admit: 2017-10-10 | Discharge: 2017-10-10 | Disposition: A | Discharge status: Home / Self Care | Source: Ambulatory Visit | Attending: Gastroenterology | Admitting: Gastroenterology

## 2017-10-10 ENCOUNTER — Encounter (HOSPITAL_COMMUNITY)
Admission: RE | Disposition: A | Discharge status: Home / Self Care | Payer: Self-pay | Source: Ambulatory Visit | Attending: Gastroenterology

## 2017-10-10 DIAGNOSIS — R079 Chest pain, unspecified: Secondary | ICD-10-CM | POA: Insufficient documentation

## 2017-10-10 DIAGNOSIS — R1310 Dysphagia, unspecified: Secondary | ICD-10-CM

## 2017-10-10 HISTORY — PX: 24 HOUR PH STUDY: SHX5419

## 2017-10-10 HISTORY — PX: ESOPHAGEAL MANOMETRY: SHX5429

## 2017-10-10 HISTORY — PX: PH IMPEDANCE STUDY: SHX5565

## 2017-10-10 SURGERY — MANOMETRY, ESOPHAGUS
Anesthesia: Topical

## 2017-10-10 MED ORDER — LIDOCAINE VISCOUS 2 % MT SOLN
OROMUCOSAL | Status: AC
  Filled 2017-10-10: qty 15

## 2017-10-10 SURGICAL SUPPLY — 2 items
FACESHIELD LNG OPTICON STERILE (SAFETY) IMPLANT
GLOVE BIO SURGEON STRL SZ8 (GLOVE) ×4 IMPLANT

## 2017-10-10 NOTE — Progress Notes (Signed)
Esophageal Manometry done per protocol without complications noted. Pt tolerated well. Ph probe inserted per protocol to 33cm per manometry. Pt teaching done using teachback regarding study and monitor. Pt verbalized understanding. Pt to return to endo unit t o have probe removed and monitor downloaded 10/11/17 1100 or after.  DR. Lavon PaganiniNandigam to get report.

## 2017-10-11 ENCOUNTER — Encounter (HOSPITAL_COMMUNITY): Payer: Self-pay | Admitting: Gastroenterology

## 2017-10-27 ENCOUNTER — Other Ambulatory Visit: Payer: Self-pay

## 2017-10-27 ENCOUNTER — Encounter (HOSPITAL_BASED_OUTPATIENT_CLINIC_OR_DEPARTMENT_OTHER): Payer: Self-pay | Admitting: Emergency Medicine

## 2017-10-27 ENCOUNTER — Emergency Department (HOSPITAL_BASED_OUTPATIENT_CLINIC_OR_DEPARTMENT_OTHER)
Admission: EM | Admit: 2017-10-27 | Discharge: 2017-10-28 | Disposition: A | Discharge status: Home / Self Care | Attending: Emergency Medicine | Admitting: Emergency Medicine

## 2017-10-27 ENCOUNTER — Emergency Department (HOSPITAL_BASED_OUTPATIENT_CLINIC_OR_DEPARTMENT_OTHER)

## 2017-10-27 DIAGNOSIS — J45909 Unspecified asthma, uncomplicated: Secondary | ICD-10-CM | POA: Diagnosis not present

## 2017-10-27 DIAGNOSIS — R0981 Nasal congestion: Secondary | ICD-10-CM | POA: Diagnosis present

## 2017-10-27 DIAGNOSIS — J069 Acute upper respiratory infection, unspecified: Secondary | ICD-10-CM | POA: Diagnosis not present

## 2017-10-27 DIAGNOSIS — Z79899 Other long term (current) drug therapy: Secondary | ICD-10-CM | POA: Insufficient documentation

## 2017-10-27 LAB — RAPID STREP SCREEN (MED CTR MEBANE ONLY): Streptococcus, Group A Screen (Direct): NEGATIVE

## 2017-10-27 MED ORDER — ONDANSETRON HCL 4 MG PO TABS: 4.0000 mg | ORAL_TABLET | Freq: Three times a day (TID) | ORAL | 0 refills | Status: DC | PRN

## 2017-10-27 NOTE — ED Triage Notes (Signed)
Pt returned from [place] Thursday. C/o cough and body aches since Friday.

## 2017-10-27 NOTE — ED Notes (Signed)
Dx'd with flu 2.5 weeks ago, was getting better, but feel worse after returning from trip to [place]. No flu vax this season. C/o sore throat, fever, chills, aches, sneezing, productive cough, sob, nausea and dizziness (denies: bleeding, dizziness or vomiting), last ate 1730, last BM 1200. No meds PTA.   Alert, NAD, calm, interactive, resps e/u, speaking in clear complete sentences, no dyspnea noted, skin W&D, initial VSS. [facility]

## 2017-10-27 NOTE — Discharge Instructions (Signed)
Your x-rays today show no evidence of pneumonia and your strep swab did not show bacterial infection of your throat.  I suspect you have a viral infection that you have encountered during her travels.  As you had a flu earlier this month, I doubt you have a second infection of influenza.  Please stay hydrated and use the nausea medicine if you get nauseated.  Please follow-up with a primary doctor in several days.  If any symptoms change or worsen, please return to the nearest emergency department.

## 2017-10-27 NOTE — ED Notes (Signed)
EDP into room, prior to RN assessment, see MD notes, pending orders.   

## 2017-10-27 NOTE — ED Provider Notes (Signed)
[facility] EMERGENCY DEPARTMENT Provider Note   CSN: [id number] Arrival date & time: 10/27/17  1810     History   Chief Complaint Chief Complaint  Patient presents with  . Flu like symptoms    HPI Amy Reed is a 32 y.o. female.  The history is provided by the patient and medical records. No language interpreter was used.  URI   This is a recurrent problem. The current episode started 2 days ago. The problem has not changed since onset.There has been no fever (sunjective). The fever has been present for 1 to 2 days. Associated symptoms include nausea, congestion, rhinorrhea, sneezing, sore throat and cough. Pertinent negatives include no chest pain, no abdominal pain, no diarrhea, no vomiting, no dysuria, no headaches, no neck pain, no rash and no wheezing. She has tried nothing for the symptoms. The treatment provided no relief.    Past Medical History:  Diagnosis Date  . Allergy   . Asthma   . GERD (gastroesophageal reflux disease)   . History of multiple allergies     Patient Active Problem List   Diagnosis Date Noted  . Family hx of colon cancer requiring screening colonoscopy 09/11/2017  . Dysphagia 09/11/2017  . Globus sensation 09/11/2017  . Cough, persistent 08/07/2017  . Gastroesophageal reflux disease without esophagitis 08/07/2017  . Candidiasis of vulva and vagina 01/06/2014  . BV (bacterial vaginosis) 01/06/2014  . Vaginal irritation 01/06/2014    Past Surgical History:  Procedure Laterality Date  . 24 HOUR PH STUDY N/A 10/10/2017   Procedure: 24 HOUR PH STUDY-OFF of PPI;  Surgeon: Napoleon FormNandigam, Kavitha V, MD;  Location: [facility];  Service: Endoscopy;  Laterality: N/A;  . DILATION AND CURETTAGE OF UTERUS    . ESOPHAGEAL MANOMETRY N/A 10/10/2017   Procedure: ESOPHAGEAL MANOMETRY (EM);  Surgeon: Napoleon FormNandigam, Kavitha V, MD;  Location: [facility];  Service: Endoscopy;  Laterality: N/A;  . PH IMPEDANCE STUDY N/A 10/10/2017   Procedure: PH  IMPEDANCE STUDY-OFF of PPI;  Surgeon: Napoleon FormNandigam, Kavitha V, MD;  Location: [facility];  Service: Endoscopy;  Laterality: N/A;    OB History    Gravida Para Term Preterm AB Living   2 1   1 1      SAB TAB Ectopic Multiple Live Births   1               Home Medications    Prior to Admission medications   Medication Sig Start Date End Date Taking? Authorizing Provider  albuterol (PROVENTIL HFA;VENTOLIN HFA) 108 (90 Base) MCG/ACT inhaler Inhale 2 puffs into the lungs every 6 (six) hours as needed for wheezing or shortness of breath.    [provider]  budesonide-formoterol (SYMBICORT) 160-4.5 MCG/ACT inhaler Inhale 2 puffs into the lungs 2 (two) times daily.    [provider]  cetirizine (ZYRTEC) 10 MG tablet Take 10 mg by mouth at bedtime. 08/02/17   [provider]  omeprazole (PRILOSEC) 40 MG capsule Take 1 capsule by mouth daily. 08/07/17   [provider]    Family History Family History  Problem Relation Age of Onset  . Colon cancer Father   . Cervical polyp Sister   . Diabetes Maternal Grandmother   . Diabetes Maternal Grandfather   . Diabetes Paternal Grandmother   . Cervical cancer Paternal Grandmother   . Diabetes Paternal Grandfather   . Colon cancer Paternal Grandfather   . Colon cancer Paternal Aunt   . Colon cancer Paternal Aunt   .  Esophageal cancer Neg Hx   . Stomach cancer Neg Hx   . Rectal cancer Neg Hx     Social History Social History   Tobacco Use  . Smoking status: Never Smoker  . Smokeless tobacco: Never Used  Substance Use Topics  . Alcohol use: Yes    Comment: 2-3 times a month.   . Drug use: No     Allergies   Shellfish allergy and Latex   Review of Systems Review of Systems  Constitutional: Positive for fatigue. Negative for chills, diaphoresis and fever.  HENT: Positive for congestion, rhinorrhea, sneezing and sore throat.   Eyes: Negative for visual disturbance.  Respiratory: Positive for  cough. Negative for chest tightness, shortness of breath, wheezing and stridor.   Cardiovascular: Negative for chest pain and palpitations.  Gastrointestinal: Positive for nausea. Negative for abdominal pain, constipation, diarrhea and vomiting.  Genitourinary: Negative for dysuria and frequency.  Musculoskeletal: Negative for back pain, neck pain and neck stiffness.  Skin: Negative for rash.  Neurological: Negative for light-headedness and headaches.  Psychiatric/Behavioral: Negative for agitation and confusion.  All other systems reviewed and are negative.    Physical Exam Updated Vital Signs BP 126/76 (BP Location: Left Arm)   Pulse 88   Temp 99.6 F (37.6 C) (Oral)   Resp 18   LMP 10/17/2017   SpO2 100%   Physical Exam  Constitutional: She is oriented to person, place, and time. She appears well-developed and well-nourished. No distress.  HENT:  Head: Normocephalic and atraumatic.  Mouth/Throat: Oropharynx is clear and moist. No oropharyngeal exudate.  Eyes: Conjunctivae and EOM are normal. Pupils are equal, round, and reactive to light.  Neck: Normal range of motion.  Cardiovascular: Normal rate, regular rhythm and intact distal pulses. Exam reveals no friction rub.  No murmur heard. Pulmonary/Chest: Effort normal. No stridor. No respiratory distress. She has no wheezes. She exhibits no tenderness.  Abdominal: Soft. She exhibits no distension. There is no tenderness.  Musculoskeletal: Normal range of motion. She exhibits no edema or tenderness.  Neurological: She is alert and oriented to person, place, and time. No sensory deficit. She exhibits normal muscle tone.  Skin: Capillary refill takes less than 2 seconds. No rash noted. She is not diaphoretic. No erythema.  Psychiatric: She has a normal mood and affect.  Nursing note and vitals reviewed.    ED Treatments / Results  Labs (all labs ordered are listed, but only abnormal results are displayed) Labs Reviewed    RAPID STREP SCREEN (NOT AT [facility])  CULTURE, GROUP A STREP [facility])    EKG  EKG Interpretation None       Radiology Dg Chest 2 View  Result Date: 10/27/2017 CLINICAL DATA:  32 year old female with cough and fever. EXAM: CHEST  2 VIEW COMPARISON:  Chest radiograph dated 07/31/2017 and CT dated 08/05/2017 FINDINGS: An area of streaky density seen on the lateral view in the upper lung appears similar to the prior radiograph and likely related to superimposition of the rib. The lungs are otherwise clear. There is no pleural effusion or pneumothorax. The cardiac silhouette is within normal limits. No acute osseous pathology. IMPRESSION: No active cardiopulmonary disease. Electronically Signed   By: Elgie Collard M.D.   On: 10/27/2017 22:24    Procedures Procedures (including critical care time)  Medications Ordered in ED Medications - No data to display   Initial Impression / Assessment and Plan / ED Course  I have reviewed the triage vital signs and the nursing  notes.  Pertinent labs & imaging results that were available during my care of the patient were reviewed by me and considered in my medical decision making (see chart for details).     RECHELLE NIEBLA is a 32 y.o. female with a past medical history significant for asthma, GERD, allergies, and recent diagnosis of influenza 2 weeks ago who presents with URI symptoms and sore throat.  Patient reports that she was diagnosed with influenza A several weeks ago and got over that infection.  She reports that she was in [place] and returned 2 days ago.  She says that she has had some rhinorrhea, congestion, and cough as well as sore throat since yesterday.  She denies taking her temperature but felt some subjective chills and fevers.  She reports sore throat as well as congestion and rhinorrhea and cough.  She reports some myalgias but denies any emesis.  She reports mild nausea.  No urinary symptoms or GI symptoms like constipation or  diarrhea.  On exam, lungs are clear.  Chest is nontender.  Abdomen nontender.  Neck is not stiff and patient has normal range of motion of her neck.  No significant rashes seen.  No focal neurologic deficits.  Patient had visible rhinorrhea and audible congestion.  No evidence of PTA or RPA on oropharyngeal exam and uvula was midline.  As patient was diagnosed with influenza several weeks ago I doubt she has recurrence of the influenza.  Patient likely has a new viral URI however given the cough and subjective fever, x-ray will be obtained to look for pneumonia following the flu.  Patient will also have strep swab given the sore throat.  Patient had EKG was reassuring after she said she felt her heart fluttering.  No evidence of arrhythmia or ischemia.  Chest x-ray shows no pneumonia and strep swab negative.  Next  Suspect viral infection.  Given patient's nausea, patient given prescription for nausea medication and instructed to stay hydrated.  Patient was feeling better after drinking fluids.  Patient will follow up with PCP in several days and understood plan of care.  Patient had no other questions or concerns and understood return precautions for worsening symptoms.     Final Clinical Impressions(s) / ED Diagnoses   Final diagnoses:  Upper respiratory tract infection, unspecified type    ED Discharge Orders        Ordered    ondansetron (ZOFRAN) 4 MG tablet  Every 8 hours PRN     10/27/17 2339     Clinical Impression: 1. Upper respiratory tract infection, unspecified type     Disposition: Discharge  Condition: Good  I have discussed the results, Dx and Tx plan with the pt(& family if present). He/she/they expressed understanding and agree(s) with the plan. Discharge instructions discussed at great length. Strict return precautions discussed and pt &/or family have verbalized understanding of the instructions. No further questions at time of discharge.    New Prescriptions    ONDANSETRON (ZOFRAN) 4 MG TABLET    Take 1 tablet (4 mg total) by mouth every 8 (eight) hours as needed for nausea or vomiting.    Follow Up: Renaye Rakers, MD [address] [place] [address] [phone]     [facility] [address] [id number] [employer] [place] [address] [phone]       Chevie Birkhead, Canary Brim, MD 10/28/17 [phone]

## 2017-10-31 ENCOUNTER — Other Ambulatory Visit: Payer: Self-pay

## 2017-10-31 ENCOUNTER — Ambulatory Visit (INDEPENDENT_AMBULATORY_CARE_PROVIDER_SITE_OTHER): Admitting: Family Medicine

## 2017-10-31 ENCOUNTER — Encounter: Payer: Self-pay | Admitting: Family Medicine

## 2017-10-31 VITALS — BP 108/70 | HR 120 | Temp 99.0°F | Resp 16 | Ht 67.0 in | Wt 119.6 lb

## 2017-10-31 DIAGNOSIS — R42 Dizziness and giddiness: Secondary | ICD-10-CM | POA: Diagnosis not present

## 2017-10-31 DIAGNOSIS — R438 Other disturbances of smell and taste: Secondary | ICD-10-CM | POA: Diagnosis not present

## 2017-10-31 DIAGNOSIS — R0602 Shortness of breath: Secondary | ICD-10-CM | POA: Diagnosis not present

## 2017-10-31 DIAGNOSIS — R5383 Other fatigue: Secondary | ICD-10-CM

## 2017-10-31 DIAGNOSIS — R252 Cramp and spasm: Secondary | ICD-10-CM

## 2017-10-31 DIAGNOSIS — R109 Unspecified abdominal pain: Secondary | ICD-10-CM | POA: Diagnosis not present

## 2017-10-31 DIAGNOSIS — H538 Other visual disturbances: Secondary | ICD-10-CM | POA: Diagnosis not present

## 2017-10-31 LAB — CULTURE, GROUP A STREP (THRC)

## 2017-10-31 NOTE — Progress Notes (Signed)
Chief Complaint  Patient presents with  . dizziness/tingling in hands/feet/fingers    x 4 months, sob and fatigued, unable to sleep last night and when she sleeps it is not restful.  Fever x 2 weeks and warm to touch couple days this week. Metallic taste in mought even after brushing teetht, feels dehydrated but is drinking lots of water, vision is blurry and per pt she is having cramping in chest feels like a balled up fist and then releases.  ? sinus infection with congestion and cough-tried mucinex with no relief    HPI   Patient had full GI work up with esophageal manometry as well as GI testing States that she also had full allergy testing She has not had any improvement in symptoms  She has seen allergy for testing and was diagnosed with asthma and was starting symbicort and albuterol  Which i  Currently she has the following constellation of symptoms: Metallic taste Dry mouth despite drinking water Blurry or our of focus vision Chest/upper abdominal cramps Ankle pain on the right Fever 2 weeks ago that resolved Insomnia Dyspnea (feeling like it is hard to take a deep breath) fatigue    Past Medical History:  Diagnosis Date  . Allergy   . Asthma   . GERD (gastroesophageal reflux disease)   . History of multiple allergies     Current Outpatient Medications  Medication Sig Dispense Refill  . albuterol (PROVENTIL HFA;VENTOLIN HFA) 108 (90 Base) MCG/ACT inhaler Inhale 2 puffs into the lungs every 6 (six) hours as needed for wheezing or shortness of breath.    . budesonide-formoterol (SYMBICORT) 160-4.5 MCG/ACT inhaler Inhale 2 puffs into the lungs 2 (two) times daily.    . cetirizine (ZYRTEC) 10 MG tablet Take 10 mg by mouth at bedtime.  0  . omeprazole (PRILOSEC) 40 MG capsule Take 1 capsule by mouth daily.  11  . ondansetron (ZOFRAN) 4 MG tablet Take 1 tablet (4 mg total) by mouth every 8 (eight) hours as needed for nausea or vomiting. 12 tablet 0   Current  Facility-Administered Medications  Medication Dose Route Frequency Provider Last Rate Last Dose  . 0.9 %  sodium chloride infusion  500 mL Intravenous Once Armbruster, Willaim Rayas, MD        Allergies:  Allergies  Allergen Reactions  . Shellfish Allergy Anaphylaxis  . Latex Itching    Past Surgical History:  Procedure Laterality Date  . 24 HOUR PH STUDY N/A 10/10/2017   Procedure: 24 HOUR PH STUDY-OFF of PPI;  Surgeon: Napoleon Form, MD;  Location: [facility];  Service: Endoscopy;  Laterality: N/A;  . DILATION AND CURETTAGE OF UTERUS    . ESOPHAGEAL MANOMETRY N/A 10/10/2017   Procedure: ESOPHAGEAL MANOMETRY (EM);  Surgeon: Napoleon Form, MD;  Location: [facility];  Service: Endoscopy;  Laterality: N/A;  . PH IMPEDANCE STUDY N/A 10/10/2017   Procedure: PH IMPEDANCE STUDY-OFF of PPI;  Surgeon: Napoleon Form, MD;  Location: [facility];  Service: Endoscopy;  Laterality: N/A;    Social History   Socioeconomic History  . Marital status: Married    Spouse name: None  . Number of children: None  . Years of education: None  . Highest education level: None  Social Needs  . Financial resource strain: None  . Food insecurity - worry: None  . Food insecurity - inability: None  . Transportation needs - medical: None  . Transportation needs - non-medical: None  Occupational History  . None  Tobacco Use  . Smoking status: Never Smoker  . Smokeless tobacco: Never Used  Substance and Sexual Activity  . Alcohol use: Yes    Comment: 2-3 times a month.   . Drug use: No  . Sexual activity: Yes    Partners: Female    Birth control/protection: None  Other Topics Concern  . None  Social History Narrative  . None    Family History  Problem Relation Age of Onset  . Colon cancer Father   . Cervical polyp Sister   . Diabetes Maternal Grandmother   . Diabetes Maternal Grandfather   . Diabetes Paternal Grandmother   . Cervical cancer Paternal Grandmother   . Diabetes  Paternal Grandfather   . Colon cancer Paternal Grandfather   . Colon cancer Paternal Aunt   . Colon cancer Paternal Aunt   . Esophageal cancer Neg Hx   . Stomach cancer Neg Hx   . Rectal cancer Neg Hx      ROS Review of Systems See HPI Constitution: No fevers or chills No malaise No diaphoresis Skin: No rash or itching Eyes: no photophobia, no double vision GU: no dysuria or hematuria Neuro: no dizziness or headaches all others reviewed and negative   Objective: Vitals:   10/31/17 1153  BP: 108/70  Pulse: (!) 120  Resp: 16  Temp: 99 F (37.2 C)  TempSrc: Oral  SpO2: 99%  Weight: 119 lb 9.6 oz (54.3 kg)  Height: 5\' 7"  (1.702 m)    Physical Exam  Constitutional: She is oriented to person, place, and time. She appears well-developed and well-nourished.  HENT:  Head: Normocephalic and atraumatic.  Eyes: Conjunctivae and EOM are normal.  Neck: Normal range of motion. No thyromegaly present.  Cardiovascular: Normal rate, regular rhythm and normal heart sounds.  No murmur heard. Pulmonary/Chest: Effort normal and breath sounds normal. No stridor. No respiratory distress. She has no wheezes.  Abdominal: Soft. Bowel sounds are normal. She exhibits no distension and no mass. There is no tenderness. There is no rebound and no guarding. No hernia.  Neurological: She is alert and oriented to person, place, and time.  Skin: Skin is warm. Capillary refill takes less than 2 seconds.  Psychiatric: She has a normal mood and affect. Her behavior is normal. Judgment and thought content normal.      ecg from care everywhere Ventricular Rate 71BPM  Atrial Rate71BPM  P-R Interval 148 ms QRS Duration 82ms Q-T Interval 340  ms QTC369 ms P Axis 46degrees  R Axis 64degrees  T Axis 42degrees   Sinus rhythm with sinus arrhythmia Normal ECG No previous ECGs available Confirmed by HAISTY, DR. Lacretia NicksW. K. ([id number]) on 08/03/2017 8:09:40 PM    Assessment and Plan Pachia was seen today for dizziness/tingling in hands/feet/fingers.  Diagnoses and all orders for this visit:  Dizziness -     Vitamin B12 -     Comprehensive metabolic panel -     TSH + free T4 -     CBC -     Iron, TIBC and Ferritin Panel  Muscle cramps -     Vitamin B12 -     TSH + free T4 -     Magnesium -     Phosphorus -     ANA w/Reflex if Positive  Blurry vision  Abdominal cramps -     Vitamin B12 -     Magnesium -     Phosphorus  Metallic taste -     TSH +  free T4  Shortness of breath -     Pro b natriuretic peptide -     CBC -     Iron, TIBC and Ferritin Panel -     Magnesium -     Phosphorus  Fatigue, unspecified type -     Vitamin B12 -     Comprehensive metabolic panel -     TSH + free T4 -     VITAMIN D 25 Hydroxy (Vit-D Deficiency, Fractures) -     Pro b natriuretic peptide -     CBC -     Iron, TIBC and Ferritin Panel -     Magnesium -     Phosphorus -     ANA w/Reflex if Positive  Advised pt to await lab results to determine treatment course Will treat for anxiety disorder if all tests are normal Discussed somatic disorders Patient is very worried and is searching for an underlying causes and this is adding stress to the situation  A total of 25 minutes were spent face-to-face with the patient during this encounter and over half of that time was spent on counseling and coordination of care.   Webber Michiels A Doylene Splinter

## 2017-10-31 NOTE — Patient Instructions (Signed)
     IF you received an x-ray today, you will receive an invoice from [facility]. Please contact [facility] at [phone] with questions or concerns regarding your invoice.   IF you received labwork today, you will receive an invoice from [facility]. Please contact [facility] at [phone] with questions or concerns regarding your invoice.   Our billing staff will not be able to assist you with questions regarding bills from these companies.  You will be contacted with the lab results as soon as they are available. The fastest way to get your results is to activate your My Chart account. Instructions are located on the last page of this paperwork. If you have not heard from us regarding the results in 2 weeks, please contact this office.     

## 2017-11-01 ENCOUNTER — Encounter: Payer: Self-pay | Admitting: Family Medicine

## 2017-11-01 LAB — PHOSPHORUS: Phosphorus: 3.4 mg/dL (ref 2.5–4.5)

## 2017-11-01 LAB — TSH+FREE T4
Free T4: 1.47 ng/dL (ref 0.82–1.77)
TSH: 1.43 u[IU]/mL (ref 0.450–4.500)

## 2017-11-01 LAB — COMPREHENSIVE METABOLIC PANEL
ALT: 13 IU/L (ref 0–32)
AST: 14 IU/L (ref 0–40)
Albumin/Globulin Ratio: 1.5 (ref 1.2–2.2)
Albumin: 4.4 g/dL (ref 3.5–5.5)
Alkaline Phosphatase: 62 IU/L (ref 39–117)
BUN/Creatinine Ratio: 8 — ABNORMAL LOW (ref 9–23)
BUN: 7 mg/dL (ref 6–20)
Bilirubin Total: 0.5 mg/dL (ref 0.0–1.2)
CO2: 22 mmol/L (ref 20–29)
Calcium: 9.7 mg/dL (ref 8.7–10.2)
Chloride: 103 mmol/L (ref 96–106)
Creatinine, Ser: 0.91 mg/dL (ref 0.57–1.00)
GFR calc Af Amer: 97 mL/min/{1.73_m2} (ref >59)
GFR calc non Af Amer: 84 mL/min/{1.73_m2} (ref >59)
Globulin, Total: 2.9 g/dL (ref 1.5–4.5)
Glucose: 84 mg/dL (ref 65–99)
Potassium: 4.3 mmol/L (ref 3.5–5.2)
Sodium: 140 mmol/L (ref 134–144)
Total Protein: 7.3 g/dL (ref 6.0–8.5)

## 2017-11-01 LAB — CBC
Hematocrit: 41.3 % (ref 34.0–46.6)
Hemoglobin: 13.7 g/dL (ref 11.1–15.9)
MCH: 32.5 pg (ref 26.6–33.0)
MCHC: 33.2 g/dL (ref 31.5–35.7)
MCV: 98 fL — ABNORMAL HIGH (ref 79–97)
Platelets: 261 10*3/uL (ref 150–379)
RBC: 4.22 x10E6/uL (ref 3.77–5.28)
RDW: 13.2 % (ref 12.3–15.4)
WBC: 4.2 10*3/uL (ref 3.4–10.8)

## 2017-11-01 LAB — IRON,TIBC AND FERRITIN PANEL
Ferritin: 47 ng/mL (ref 15–150)
Iron Saturation: 27 % (ref 15–55)
Iron: 102 ug/dL (ref 27–159)
Total Iron Binding Capacity: 376 ug/dL (ref 250–450)
UIBC: 274 ug/dL (ref 131–425)

## 2017-11-01 LAB — VITAMIN B12: Vitamin B-12: 803 pg/mL (ref 232–1245)

## 2017-11-01 LAB — PRO B NATRIURETIC PEPTIDE: NT-Pro BNP: <5 pg/mL (ref 0–130)

## 2017-11-01 LAB — MAGNESIUM: Magnesium: 2 mg/dL (ref 1.6–2.3)

## 2017-11-01 LAB — VITAMIN D 25 HYDROXY (VIT D DEFICIENCY, FRACTURES): Vit D, 25-Hydroxy: 14.7 ng/mL — ABNORMAL LOW (ref 30.0–100.0)

## 2017-11-01 LAB — ANA W/REFLEX IF POSITIVE: Anti Nuclear Antibody(ANA): NEGATIVE

## 2017-11-06 ENCOUNTER — Encounter: Payer: Self-pay | Admitting: Family Medicine

## 2018-02-07 ENCOUNTER — Ambulatory Visit: Payer: Self-pay | Admitting: *Deleted

## 2018-02-07 ENCOUNTER — Other Ambulatory Visit: Payer: Self-pay

## 2018-02-07 ENCOUNTER — Ambulatory Visit (INDEPENDENT_AMBULATORY_CARE_PROVIDER_SITE_OTHER): Admitting: Family Medicine

## 2018-02-07 ENCOUNTER — Encounter: Payer: Self-pay | Admitting: Family Medicine

## 2018-02-07 VITALS — BP 110/70 | HR 97 | Temp 98.6°F | Ht 67.0 in | Wt 125.0 lb

## 2018-02-07 DIAGNOSIS — E559 Vitamin D deficiency, unspecified: Secondary | ICD-10-CM

## 2018-02-07 DIAGNOSIS — R079 Chest pain, unspecified: Secondary | ICD-10-CM

## 2018-02-07 DIAGNOSIS — R252 Cramp and spasm: Secondary | ICD-10-CM | POA: Diagnosis not present

## 2018-02-07 DIAGNOSIS — R198 Other specified symptoms and signs involving the digestive system and abdomen: Secondary | ICD-10-CM

## 2018-02-07 DIAGNOSIS — R0989 Other specified symptoms and signs involving the circulatory and respiratory systems: Secondary | ICD-10-CM

## 2018-02-07 DIAGNOSIS — R0602 Shortness of breath: Secondary | ICD-10-CM

## 2018-02-07 DIAGNOSIS — F458 Other somatoform disorders: Secondary | ICD-10-CM

## 2018-02-07 NOTE — Telephone Encounter (Signed)
Patient reports symptoms of some shortness of breath for approx 6 months.Reports fatigue as well. At this time pt is speaking in complete sentences sounds in no severe distress at this timeon the phone . She reports a new onset of swelling of her ankles as well. Appointment made today with Dr Creta LevinStallings pt advice given.   Reason for Disposition . [1] MODERATE longstanding difficulty breathing (e.g., speaks in phrases, SOB even at rest, pulse 100-120) AND [2] SAME as normal  Answer Assessment - Initial Assessment Questions 1. RESPIRATORY STATUS: "Describe your breathing?" (e.g., wheezing, shortness of breath, unable to speak, severe coughing)        Reports shortness of  Breath- No severe distress  At this time   2. ONSET: "When did this breathing problem begin?"        About 7 months  Symptoms about the same   3. PATTERN "Does the difficult breathing come and go, or has it been constant since it started?"        Some days are better than normal  4. SEVERITY: "How bad is your breathing?" (e.g., mild, moderate, severe)    - MILD: No SOB at rest, mild SOB with walking, speaks normally in sentences, can lay down, no retractions, pulse < 100.    - MODERATE: SOB at rest, SOB with minimal exertion and prefers to sit, cannot lie down flat, speaks in phrases, mild retractions, audible wheezing, pulse 100-120.    - SEVERE: Very SOB at rest, speaks in single words, struggling to breathe, sitting hunched forward, retractions, pulse > 120       Moderate   5. RECURRENT SYMPTOM: "Have you had difficulty breathing before?" If so, ask: "When was the last time?" and "What happened that time?"        Yes   -  Severe  Allergies   approx 6  Month was rx  Inhalers   6. CARDIAC HISTORY: "Do you have any history of heart disease?" (e.g., heart attack, angina, bypass surgery, angioplasty)          Strong family  History   7. LUNG HISTORY: "Do you have any history of lung disease?"  (e.g., pulmonary embolus, asthma,  emphysema)            Breathing difficulties  From allergies   8. CAUSE: "What do you think is causing the breathing problem?"        Not sure   9. OTHER SYMPTOMS: "Do you have any other symptoms? (e.g., dizziness, runny nose, cough, chest pain, fever)      Night sweats  Mild ankle swelling -3-4 days ago mild chest pain l side intermittant for several months   10. PREGNANCY: "Is there any chance you are pregnant?" "When was your last menstrual period?"       May 22   11. TRAVEL: "Have you traveled out of the country in the last month?" (e.g., travel history, exposures)       no  Protocols used: BREATHING DIFFICULTY-A-AH

## 2018-02-07 NOTE — Patient Instructions (Signed)
     IF you received an x-ray today, you will receive an invoice from [facility]. Please contact [facility] at [phone] with questions or concerns regarding your invoice.   IF you received labwork today, you will receive an invoice from [facility]. Please contact [facility] at [phone] with questions or concerns regarding your invoice.   Our billing staff will not be able to assist you with questions regarding bills from these companies.  You will be contacted with the lab results as soon as they are available. The fastest way to get your results is to activate your My Chart account. Instructions are located on the last page of this paperwork. If you have not heard from us regarding the results in 2 weeks, please contact this office.     

## 2018-02-07 NOTE — Progress Notes (Signed)
Chief Complaint  Patient presents with  . Shortness of Breath    having chest pain    HPI  She reports continued shortness of breath Overnight she had to sit up to breath due to the shortness of breath She reports that she gets substernal chest pain that goes up to her throat She was worked up by ENT Dr. Gearldine Bienenstock and had an esophagram and a complete work up which did not show GERD xrays in the past have been negative for pulmonary infection ECGs have been normal ANA was normal but her symptoms does not seem to fit any category She had red eyes that were thought to be corneal abrasions.  She also has muscle cramps and joint pains.  She gets intermittent hives.  She was diagnosed with ?asthma   She uses symbicort bid She uses her rescue inhaler some night but does not notice any improvement of symptoms  Wt Readings from Last 3 Encounters:  02/07/18 125 lb (56.7 kg)  10/31/17 119 lb 9.6 oz (54.3 kg)  10/03/17 120 lb 6.4 oz (54.6 kg)     Past Medical History:  Diagnosis Date  . Allergy   . Asthma   . GERD (gastroesophageal reflux disease)   . History of multiple allergies     Current Outpatient Medications  Medication Sig Dispense Refill  . albuterol (PROVENTIL HFA;VENTOLIN HFA) 108 (90 Base) MCG/ACT inhaler Inhale 2 puffs into the lungs every 6 (six) hours as needed for wheezing or shortness of breath.    . budesonide-formoterol (SYMBICORT) 160-4.5 MCG/ACT inhaler Inhale 2 puffs into the lungs 2 (two) times daily.    . cetirizine (ZYRTEC) 10 MG tablet Take 10 mg by mouth at bedtime.  0  . omeprazole (PRILOSEC) 40 MG capsule Take 1 capsule by mouth daily.  11  . ondansetron (ZOFRAN) 4 MG tablet Take 1 tablet (4 mg total) by mouth every 8 (eight) hours as needed for nausea or vomiting. 12 tablet 0   Current Facility-Administered Medications  Medication Dose Route Frequency Provider Last Rate Last Dose  . 0.9 %  sodium chloride infusion  500 mL Intravenous Once Armbruster, Willaim Rayas, MD        Allergies:  Allergies  Allergen Reactions  . Shellfish Allergy Anaphylaxis  . Latex Itching    Past Surgical History:  Procedure Laterality Date  . 24 HOUR PH STUDY N/A 10/10/2017   Procedure: 24 HOUR PH STUDY-OFF of PPI;  Surgeon: Napoleon Form, MD;  Location: [facility];  Service: Endoscopy;  Laterality: N/A;  . DILATION AND CURETTAGE OF UTERUS    . ESOPHAGEAL MANOMETRY N/A 10/10/2017   Procedure: ESOPHAGEAL MANOMETRY (EM);  Surgeon: Napoleon Form, MD;  Location: [facility];  Service: Endoscopy;  Laterality: N/A;  . PH IMPEDANCE STUDY N/A 10/10/2017   Procedure: PH IMPEDANCE STUDY-OFF of PPI;  Surgeon: Napoleon Form, MD;  Location: [facility];  Service: Endoscopy;  Laterality: N/A;    Social History   Socioeconomic History  . Marital status: Married    Spouse name: Not on file  . Number of children: Not on file  . Years of education: Not on file  . Highest education level: Not on file  Occupational History  . Not on file  Social Needs  . Financial resource strain: Not on file  . Food insecurity:    Worry: Not on file    Inability: Not on file  . Transportation needs:    Medical: Not on file  Non-medical: Not on file  Tobacco Use  . Smoking status: Never Smoker  . Smokeless tobacco: Never Used  Substance and Sexual Activity  . Alcohol use: Yes    Comment: 2-3 times a month.   . Drug use: No  . Sexual activity: Yes    Partners: Female    Birth control/protection: None  Lifestyle  . Physical activity:    Days per week: Not on file    Minutes per session: Not on file  . Stress: Not on file  Relationships  . Social connections:    Talks on phone: Not on file    Gets together: Not on file    Attends religious service: Not on file    Active member of club or organization: Not on file    Attends meetings of clubs or organizations: Not on file    Relationship status: Not on file  Other Topics Concern  . Not on file  Social  History Narrative  . Not on file    Family History  Problem Relation Age of Onset  . Colon cancer Father   . Cervical polyp Sister   . Diabetes Maternal Grandmother   . Diabetes Maternal Grandfather   . Diabetes Paternal Grandmother   . Cervical cancer Paternal Grandmother   . Diabetes Paternal Grandfather   . Colon cancer Paternal Grandfather   . Colon cancer Paternal Aunt   . Colon cancer Paternal Aunt   . Esophageal cancer Neg Hx   . Stomach cancer Neg Hx   . Rectal cancer Neg Hx      ROS Review of Systems See HPI Constitution: No fevers or chills No malaise No diaphoresis Skin: No rash or itching Eyes: no blurry vision, no double vision GU: no dysuria or hematuria Neuro: no dizziness or headaches all others reviewed and negative   Objective: Vitals:   02/07/18 1541  BP: 110/70  Pulse: 97  Temp: 98.6 F (37 C)  TempSrc: Oral  SpO2: 100%  Weight: 125 lb (56.7 kg)    Physical Exam General: alert, oriented, in NAD Head: normocephalic, atraumatic, no sinus tenderness Eyes: EOM intact, no scleral icterus or conjunctival injection Ears: TM clear bilaterally Nose: mucosa nonerythematous, nonedematous Throat: no pharyngeal exudate or erythema Lymph: no posterior auricular, submental or cervical lymph adenopathy Heart: normal rate, normal sinus rhythm, no murmurs Lungs: clear to auscultation bilaterally, no wheezing   Assessment and Plan Lola was seen today for shortness of breath.  Diagnoses and all orders for this visit:  Shortness of breath -     Ambulatory referral to Pulmonology -     Ambulatory referral to Rheumatology  Chest pain, unspecified type -     Ambulatory referral to Pulmonology -     Ambulatory referral to Rheumatology  Muscle cramps -     Ambulatory referral to Pulmonology -     Ambulatory referral to Rheumatology  Globus sensation -     Ambulatory referral to Pulmonology -     Ambulatory referral to Rheumatology  Vitamin D  deficiency -     Cancel: VITAMIN D 25 Hydroxy (Vit-D Deficiency, Fractures)  Other orders -     Cancel: DG Chest 2 View; Future  Referral to Pulmonology Patient is not getting relief from symbicort and still with shortness of breath Would like Pulmonology to weigh in  Referral to Rheumatology Regarding her previous red eyes, joint pains, respiratory symptoms, fatigue would like Rheumatology to weigh in as her work up has been inconclusive and  she still has symptoms   A total of 30 minutes were spent face-to-face with the patient during this encounter and over half of that time was spent on counseling and coordination of care. Discussed her previous work up and anticipated plan for the future.   Jaylinn Hellenbrand A Nil Bolser

## 2018-02-14 ENCOUNTER — Encounter: Payer: Self-pay | Admitting: Family Medicine

## 2018-02-18 ENCOUNTER — Emergency Department (HOSPITAL_BASED_OUTPATIENT_CLINIC_OR_DEPARTMENT_OTHER)

## 2018-02-18 ENCOUNTER — Other Ambulatory Visit: Payer: Self-pay

## 2018-02-18 ENCOUNTER — Emergency Department (HOSPITAL_BASED_OUTPATIENT_CLINIC_OR_DEPARTMENT_OTHER)
Admission: EM | Admit: 2018-02-18 | Discharge: 2018-02-18 | Disposition: A | Discharge status: Home / Self Care | Attending: Physician Assistant | Admitting: Physician Assistant

## 2018-02-18 ENCOUNTER — Encounter (HOSPITAL_BASED_OUTPATIENT_CLINIC_OR_DEPARTMENT_OTHER): Payer: Self-pay | Admitting: Emergency Medicine

## 2018-02-18 DIAGNOSIS — Z79899 Other long term (current) drug therapy: Secondary | ICD-10-CM | POA: Insufficient documentation

## 2018-02-18 DIAGNOSIS — R55 Syncope and collapse: Secondary | ICD-10-CM | POA: Insufficient documentation

## 2018-02-18 DIAGNOSIS — R51 Headache: Secondary | ICD-10-CM | POA: Diagnosis not present

## 2018-02-18 DIAGNOSIS — Z3202 Encounter for pregnancy test, result negative: Secondary | ICD-10-CM | POA: Insufficient documentation

## 2018-02-18 DIAGNOSIS — J45909 Unspecified asthma, uncomplicated: Secondary | ICD-10-CM | POA: Insufficient documentation

## 2018-02-18 DIAGNOSIS — R079 Chest pain, unspecified: Secondary | ICD-10-CM | POA: Diagnosis not present

## 2018-02-18 DIAGNOSIS — Z91040 Latex allergy status: Secondary | ICD-10-CM | POA: Insufficient documentation

## 2018-02-18 LAB — PREGNANCY, URINE: Preg Test, Ur: NEGATIVE

## 2018-02-18 LAB — BASIC METABOLIC PANEL
Anion gap: 5 (ref 5–15)
BUN: 9 mg/dL (ref 6–20)
CO2: 27 mmol/L (ref 22–32)
Calcium: 9.2 mg/dL (ref 8.9–10.3)
Chloride: 106 mmol/L (ref 101–111)
Creatinine, Ser: 0.91 mg/dL (ref 0.44–1.00)
GFR calc Af Amer: >60 mL/min (ref >60)
GFR calc non Af Amer: >60 mL/min (ref >60)
Glucose, Bld: 90 mg/dL (ref 65–99)
Potassium: 3.8 mmol/L (ref 3.5–5.1)
Sodium: 138 mmol/L (ref 135–145)

## 2018-02-18 LAB — URINALYSIS, ROUTINE W REFLEX MICROSCOPIC
Bilirubin Urine: NEGATIVE
Glucose, UA: NEGATIVE mg/dL
Hgb urine dipstick: NEGATIVE
Ketones, ur: NEGATIVE mg/dL
Leukocytes, UA: NEGATIVE
Nitrite: NEGATIVE
Protein, ur: NEGATIVE mg/dL
Specific Gravity, Urine: 1.01 (ref 1.005–1.030)
pH: 8 (ref 5.0–8.0)

## 2018-02-18 LAB — CBC
HCT: 39.8 % (ref 36.0–46.0)
Hemoglobin: 13.6 g/dL (ref 12.0–15.0)
MCH: 32.7 pg (ref 26.0–34.0)
MCHC: 34.2 g/dL (ref 30.0–36.0)
MCV: 95.7 fL (ref 78.0–100.0)
Platelets: 201 10*3/uL (ref 150–400)
RBC: 4.16 MIL/uL (ref 3.87–5.11)
RDW: 11.7 % (ref 11.5–15.5)
WBC: 5.8 10*3/uL (ref 4.0–10.5)

## 2018-02-18 LAB — CBG MONITORING, ED: Glucose-Capillary: 92 mg/dL (ref 65–99)

## 2018-02-18 LAB — TROPONIN I: Troponin I: <0.03 ng/mL (ref <0.03)

## 2018-02-18 NOTE — ED Provider Notes (Signed)
[facility] EMERGENCY DEPARTMENT Provider Note   CSN: [id number] Arrival date & time: 02/18/18  1457     History   Chief Complaint Chief Complaint  Patient presents with  . Loss of Consciousness    HPI Amy Reed is a 32 y.o. female.  HPI   Patient is a 32 year old female presenting with multiple symptoms.  Patient has had large work-ups as an outpatient.  She reports that since November she has not been feeling "well".  She reports occasional feelings of fatigue, occasional numbness and tingling in all of her extremities, occasional chest pains, occasional shortness of breath.  Patient reports today she was walking the mall and she felt like she might pass out she went to her car and then she "woke up later".  When she woke up she drove home.  She was telling her partner about the symptoms and she decided that she wanted to get seen so came here.  Patient has no risk factors for pulmonary embolism including no estrogens, no long travel, no bilateral leg swelling.  Patient has had large work-up with her primary care for similar symptoms including EGD, colonoscopy.  She reports that she is going to see a physician for "testing of all her muscles".  Patient does report early cardiac ischemia in her father.  Otherwise she has no history of hypertension hyperlipidemia.  Patient is accompanied by her wife in the room.  Past Medical History:  Diagnosis Date  . Allergy   . Asthma   . GERD (gastroesophageal reflux disease)   . History of multiple allergies     Patient Active Problem List   Diagnosis Date Noted  . Family hx of colon cancer requiring screening colonoscopy 09/11/2017  . Dysphagia 09/11/2017  . Globus sensation 09/11/2017  . Cough, persistent 08/07/2017  . Gastroesophageal reflux disease without esophagitis 08/07/2017  . Candidiasis of vulva and vagina 01/06/2014  . BV (bacterial vaginosis) 01/06/2014  . Vaginal irritation 01/06/2014    Past  Surgical History:  Procedure Laterality Date  . 24 HOUR PH STUDY N/A 10/10/2017   Procedure: 24 HOUR PH STUDY-OFF of PPI;  Surgeon: Napoleon FormNandigam, Kavitha V, MD;  Location: [facility];  Service: Endoscopy;  Laterality: N/A;  . DILATION AND CURETTAGE OF UTERUS    . ESOPHAGEAL MANOMETRY N/A 10/10/2017   Procedure: ESOPHAGEAL MANOMETRY (EM);  Surgeon: Napoleon FormNandigam, Kavitha V, MD;  Location: [facility];  Service: Endoscopy;  Laterality: N/A;  . PH IMPEDANCE STUDY N/A 10/10/2017   Procedure: PH IMPEDANCE STUDY-OFF of PPI;  Surgeon: Napoleon FormNandigam, Kavitha V, MD;  Location: [facility];  Service: Endoscopy;  Laterality: N/A;     OB History    Gravida  2   Para  1   Term      Preterm  1   AB  1   Living        SAB  1   TAB      Ectopic      Multiple      Live Births               Home Medications    Prior to Admission medications   Medication Sig Start Date End Date Taking? Authorizing Provider  albuterol (PROVENTIL HFA;VENTOLIN HFA) 108 (90 Base) MCG/ACT inhaler Inhale 2 puffs into the lungs every 6 (six) hours as needed for wheezing or shortness of breath.    [provider]  budesonide-formoterol (SYMBICORT) 160-4.5 MCG/ACT inhaler Inhale 2 puffs into the lungs 2 (two)  times daily.    [provider]  cetirizine (ZYRTEC) 10 MG tablet Take 10 mg by mouth at bedtime. 08/02/17   [provider]  omeprazole (PRILOSEC) 40 MG capsule Take 1 capsule by mouth daily. 08/07/17   [provider]  ondansetron (ZOFRAN) 4 MG tablet Take 1 tablet (4 mg total) by mouth every 8 (eight) hours as needed for nausea or vomiting. 10/27/17   Tegeler, Canary Brim, MD    Family History Family History  Problem Relation Age of Onset  . Colon cancer Father   . Cervical polyp Sister   . Diabetes Maternal Grandmother   . Diabetes Maternal Grandfather   . Diabetes Paternal Grandmother   . Cervical cancer Paternal Grandmother   . Diabetes Paternal Grandfather   . Colon  cancer Paternal Grandfather   . Colon cancer Paternal Aunt   . Colon cancer Paternal Aunt   . Esophageal cancer Neg Hx   . Stomach cancer Neg Hx   . Rectal cancer Neg Hx     Social History Social History   Tobacco Use  . Smoking status: Never Smoker  . Smokeless tobacco: Never Used  Substance Use Topics  . Alcohol use: Yes    Comment: 2-3 times a month.   . Drug use: No     Allergies   Shellfish allergy and Latex   Review of Systems Review of Systems  Constitutional: Positive for activity change and fatigue.  Respiratory: Positive for shortness of breath.   Cardiovascular: Positive for chest pain.  Gastrointestinal: Positive for abdominal pain.  Neurological: Positive for weakness, light-headedness and numbness.  All other systems reviewed and are negative.    Physical Exam Updated Vital Signs BP 118/73 (BP Location: Right Arm)   Pulse 76   Temp 98.4 F (36.9 C) (Oral)   Resp 18   Ht 5\' 7"  (1.702 m)   Wt 56.7 kg (125 lb)   LMP 02/05/2018   SpO2 100%   BMI 19.58 kg/m   Physical Exam  Constitutional: She is oriented to person, place, and time. She appears well-developed and well-nourished.  HENT:  Head: Normocephalic and atraumatic.  Eyes: Right eye exhibits no discharge. Left eye exhibits no discharge.  Cardiovascular: Normal rate and regular rhythm.  No murmur heard. Pulmonary/Chest: Effort normal and breath sounds normal. No respiratory distress.  Abdominal: Soft. She exhibits no distension. There is no tenderness.  Neurological: She is oriented to person, place, and time. No cranial nerve deficit.  Skin: Skin is warm and dry. She is not diaphoretic.  Psychiatric: She has a normal mood and affect.  Nursing note and vitals reviewed.    ED Treatments / Results  Labs (all labs ordered are listed, but only abnormal results are displayed) Labs Reviewed  BASIC METABOLIC PANEL  CBC  TROPONIN I  URINALYSIS, ROUTINE W REFLEX MICROSCOPIC  PREGNANCY,  URINE  CBG MONITORING, ED    EKG None  Radiology No results found.  Procedures Procedures (including critical care time)  Medications Ordered in ED Medications - No data to display   Initial Impression / Assessment and Plan / ED Course  I have reviewed the triage vital signs and the nursing notes.  Pertinent labs & imaging results that were available during my care of the patient were reviewed by me and considered in my medical decision making (see chart for details).     Patient is a 32 year old female presenting with multiple symptoms.  Patient has had large work-ups as an outpatient.  She reports that since November she has not been feeling "well".  She reports occasional feelings of fatigue, occasional numbness and tingling in all of her extremities, occasional chest pains, occasional shortness of breath.  Patient reports today she was walking the mall and she felt like she might pass out she went to her car and then she "woke up later".  When she woke up she drove home.  She was telling her partner about the symptoms and she decided that she wanted to get seen so came here.  Patient has no risk factors for pulmonary embolism including no estrogens, no long travel, no bilateral leg swelling.  Patient has had large work-up with her primary care for similar symptoms including EGD, colonoscopy.  She reports that she is going to see a physician for "testing of all her muscles".  Patient does report early cardiac ischemia in her father.  Otherwise she has no history of hypertension hyperlipidemia.  Patient is accompanied by her wife in the room.  6:00 PM Patient appears clinically very well.  With normal vital signs normal physical exam.  Given that the symptoms been going on since November, I doubt that we will come to the stiology today.  However we will run baseline labs.  Patient complaining of a new headache today, will get CT to rule out dangerous etiology.  Given the syncope.   However I think that it syncope sounds very much vasovagal she was felt hot and nauseated beforehand.  Could also be due to dehydration although patient's vital signs are normal here.  Final Clinical Impressions(s) / ED Diagnoses   Final diagnoses:  None    ED Discharge Orders    None       Abelino Derrick, MD 02/18/18 2324

## 2018-02-18 NOTE — ED Notes (Signed)
Pt reports feeling weak and hot all over earlier and passing out. Pt unsure how long she had a LOC. Pt reports HA on assessment with blurred vision and dizziness.

## 2018-02-18 NOTE — ED Triage Notes (Signed)
Pt states she was walking around the mall when she suddenly became hot. She went out to her car and states she passed out. Pt states she has been having chest pain, SOB and dizziness for several weeks and been seen by her doctor.

## 2018-02-18 NOTE — ED Notes (Signed)
ED Provider at bedside. 

## 2018-02-18 NOTE — Discharge Instructions (Addendum)
You may need to talk to your primary care provider about having a cardiac event monitor.  We are happy to report that your work-up here is been negative.  We think is important that you follow-up with your primary care physician and continue to work-up your symptoms as an outpatient.

## 2018-02-21 ENCOUNTER — Encounter: Payer: Self-pay | Admitting: Family Medicine

## 2018-03-12 ENCOUNTER — Institutional Professional Consult (permissible substitution): Admitting: Internal Medicine

## 2018-04-03 ENCOUNTER — Ambulatory Visit (INDEPENDENT_AMBULATORY_CARE_PROVIDER_SITE_OTHER)
Admission: RE | Admit: 2018-04-03 | Discharge: 2018-04-03 | Disposition: A | Discharge status: Home / Self Care | Source: Ambulatory Visit | Attending: Pulmonary Disease | Admitting: Pulmonary Disease

## 2018-04-03 ENCOUNTER — Encounter: Payer: Self-pay | Admitting: Pulmonary Disease

## 2018-04-03 ENCOUNTER — Other Ambulatory Visit

## 2018-04-03 ENCOUNTER — Ambulatory Visit (INDEPENDENT_AMBULATORY_CARE_PROVIDER_SITE_OTHER): Admitting: Pulmonary Disease

## 2018-04-03 VITALS — BP 110/80 | HR 87 | Ht 67.0 in | Wt 124.6 lb

## 2018-04-03 DIAGNOSIS — R0602 Shortness of breath: Secondary | ICD-10-CM

## 2018-04-03 DIAGNOSIS — R0789 Other chest pain: Secondary | ICD-10-CM

## 2018-04-03 NOTE — Patient Instructions (Signed)
Shortness of breath Chest pain No significant response with use of MDI-Symbicort and albuterol  You may stop Symbicort and albuterol  Will obtain chest x-ray  Obtain allergy panel  Obtain pulmonary function study  If all negative, will call  We will follow as needed  Will not suggest any other intervention at present  Other work-up for ongoing symptoms recommended, less likely related to any pulmonary condition

## 2018-04-03 NOTE — Progress Notes (Signed)
Amy Reed    [id number]    November 25, 1985  Primary Care Physician:Stallings, Manus RuddZoe A, MD  Referring Physician: Doristine BosworthStallings, Zoe A, MD [address] [place], [place] [address]  Chief complaint:   Shortness of breath and chest discomfort  HPI:  Patient is been seen for shortness of breath and chest discomfort Chest discomfort usually occurs at night She had one episodes of chest tightness and heaviness which self resolved Shortness of breath/fatigue with exertion She does have multiple symptoms Never diagnosed with any lung disease No known family history of lung disease Never smoker  Recent symptoms that was evaluated-globus pallidus, reflux Was evaluated by ENT and also GI-negative evaluation She is no longer on PPI  She has been on Symbicort and albuterol-has not noticed any significant improvement in symptoms  Symptoms do not at present suggest any significant underlying lung disease  Pets: Pets around at home but she is not around him Occupation: No pertinent occupational history  exposures: No significant exposure to mold known Smoking history: Never smoker Travel history: No recent travels Relevant family history: Dads side does have some lung disease, she is unclear of is related to smoking  Outpatient Encounter Medications as of 04/03/2018  Medication Sig  . albuterol (PROVENTIL HFA;VENTOLIN HFA) 108 (90 Base) MCG/ACT inhaler Inhale 2 puffs into the lungs every 6 (six) hours as needed for wheezing or shortness of breath.  . budesonide-formoterol (SYMBICORT) 160-4.5 MCG/ACT inhaler Inhale 2 puffs into the lungs 2 (two) times daily.  . cetirizine (ZYRTEC) 10 MG tablet Take 10 mg by mouth at bedtime.  Marland Kitchen. omeprazole (PRILOSEC) 40 MG capsule Take 1 capsule by mouth daily.  . ondansetron (ZOFRAN) 4 MG tablet Take 1 tablet (4 mg total) by mouth every 8 (eight) hours as needed for nausea or vomiting.   Facility-Administered Encounter Medications as of 04/03/2018    Medication  . 0.9 %  sodium chloride infusion    Allergies as of 04/03/2018 - Review Complete 02/18/2018  Allergen Reaction Noted  . Shellfish allergy Anaphylaxis 07/29/2017  . Latex Itching 01/23/2017    Past Medical History:  Diagnosis Date  . Allergy   . Asthma   . GERD (gastroesophageal reflux disease)   . History of multiple allergies     Past Surgical History:  Procedure Laterality Date  . 24 HOUR PH STUDY N/A 10/10/2017   Procedure: 24 HOUR PH STUDY-OFF of PPI;  Surgeon: Napoleon FormNandigam, Kavitha V, MD;  Location: [facility];  Service: Endoscopy;  Laterality: N/A;  . DILATION AND CURETTAGE OF UTERUS    . ESOPHAGEAL MANOMETRY N/A 10/10/2017   Procedure: ESOPHAGEAL MANOMETRY (EM);  Surgeon: Napoleon FormNandigam, Kavitha V, MD;  Location: [facility];  Service: Endoscopy;  Laterality: N/A;  . PH IMPEDANCE STUDY N/A 10/10/2017   Procedure: PH IMPEDANCE STUDY-OFF of PPI;  Surgeon: Napoleon FormNandigam, Kavitha V, MD;  Location: [facility];  Service: Endoscopy;  Laterality: N/A;    Family History  Problem Relation Age of Onset  . Colon cancer Father   . Cervical polyp Sister   . Diabetes Maternal Grandmother   . Diabetes Maternal Grandfather   . Diabetes Paternal Grandmother   . Cervical cancer Paternal Grandmother   . Diabetes Paternal Grandfather   . Colon cancer Paternal Grandfather   . Colon cancer Paternal Aunt   . Colon cancer Paternal Aunt   . Esophageal cancer Neg Hx   . Stomach cancer Neg Hx   . Rectal cancer Neg Hx  Social History   Socioeconomic History  . Marital status: Married    Spouse name: Not on file  . Number of children: Not on file  . Years of education: Not on file  . Highest education level: Not on file  Occupational History  . Not on file  Social Needs  . Financial resource strain: Not on file  . Food insecurity:    Worry: Not on file    Inability: Not on file  . Transportation needs:    Medical: Not on file    Non-medical: Not on file  Tobacco Use  .  Smoking status: Never Smoker  . Smokeless tobacco: Never Used  Substance and Sexual Activity  . Alcohol use: Yes    Comment: 2-3 times a month.   . Drug use: No  . Sexual activity: Yes    Partners: Female    Birth control/protection: None  Lifestyle  . Physical activity:    Days per week: Not on file    Minutes per session: Not on file  . Stress: Not on file  Relationships  . Social connections:    Talks on phone: Not on file    Gets together: Not on file    Attends religious service: Not on file    Active member of club or organization: Not on file    Attends meetings of clubs or organizations: Not on file    Relationship status: Not on file  . Intimate partner violence:    Fear of current or ex partner: Not on file    Emotionally abused: Not on file    Physically abused: Not on file    Forced sexual activity: Not on file  Other Topics Concern  . Not on file  Social History Narrative  . Not on file    Review of systems: Review of Systems  Constitutional: Negative for fever and chills.  HENT: Negative.  No nasal lesions or nasal stuffiness Eyes: Negative for blurred vision.  Respiratory: as per HPI, no wheezing Cardiovascular: Describes symptoms of chest discomfort, not associated with exertion.  Gastrointestinal: Sensation of neck fullness, dysphagia is better Genitourinary: Negative for dysuria, urgency, frequency and hematuria.  Musculoskeletal: Negative for myalgias, back pain and joint pain.  Skin: Negative for itching and rash.  Neurological: Negative for dizziness, tremors, focal weakness, seizures and loss of consciousness.  Endo/Heme/Allergies: Negative for environmental allergies.  Psychiatric/Behavioral: Negative for depression, suicidal ideas and hallucinations.  All other systems reviewed and are negative.  Physical Exam: Blood pressure 110/80, pulse 87, height 5\' 7"  (1.702 m), weight 124 lb 9.6 oz (56.5 kg), SpO2 97 %. Gen:      She does appear  comfortable HEENT: No nasal lesions, no oral lesions Neck:     Neck is supple with no masses Lungs:    Clear breath sounds bilaterally with no added sounds  CV:         S1-S2 with no murmur Abd:      Positive bowel sounds nontender  Ext:    No edema; adequate peripheral perfusion Skin:      Warm and dry; no rash Neuro: alert and oriented x 3 Psych: normal mood and affect  Data Reviewed: Chest x-ray from 02/18/2018 shows no acute infiltrate-reviewed by myself Chem-7/CBC from 02/18/2018-unrevealing  Assessment:  .  Shortness of breath .  Chest pain and discomfort .  Questionable allergy history .  History of GERD-  Negative evaluation .  History of globus pallidus.  Plan/Recommendations: Will obtain a  pulmonary function study Obtain repeat chest x-ray secondary to recent symptoms Obtain allergy panel  No underlying history of lung disease No significant exposures No worsening of her symptoms with activity-appears to be more globally fatigued Chest discomfort does not appear to be related to activity  Will review above studies and if negative we will not recommend any further studies She did not notice any significant improvement with use of Symbicort and albuterol-advised to discontinue  Will follow as needed  Virl Diamond MD [facility] 04/03/2018, 9:17 AM  CC: Doristine Bosworth, MD

## 2018-04-04 LAB — RESPIRATORY ALLERGY PROFILE REGION II ~~LOC~~
Allergen, A. alternata, m6: 0.72 kU/L — ABNORMAL HIGH
Allergen, Cedar tree, t12: <0.1 kU/L
Allergen, Comm Silver Birch, t9: 0.12 kU/L — ABNORMAL HIGH
Allergen, Cottonwood, t14: 0.11 kU/L — ABNORMAL HIGH
Allergen, D pternoyssinus,d7: <0.1 kU/L
Allergen, Mouse Urine Protein, e78: <0.1 kU/L
Allergen, Mulberry, t76: <0.1 kU/L
Allergen, Oak,t7: <0.1 kU/L
Allergen, P. notatum, m1: <0.1 kU/L
Aspergillus fumigatus, m3: 0.17 kU/L — ABNORMAL HIGH
Bermuda Grass: 0.19 kU/L — ABNORMAL HIGH
Box Elder IgE: <0.1 kU/L
CLADOSPORIUM HERBARUM (M2) IGE: 0.15 kU/L — ABNORMAL HIGH
COMMON RAGWEED (SHORT) (W1) IGE: 0.12 kU/L — ABNORMAL HIGH
Cat Dander: 0.12 kU/L — ABNORMAL HIGH
Class: 0
Class: 0
Class: 0
Class: 0
Class: 0
Class: 0
Class: 0
Class: 0
Class: 0
Class: 0
Class: 0
Class: 0
Class: 0
Class: 0
Class: 0
Class: 0
Class: 0
Class: 0
Class: 0
Class: 0
Class: 2
Class: 2
Class: 2
Class: 2
Cockroach: <0.1 kU/L
D. farinae: <0.1 kU/L
Dog Dander: <0.1 kU/L
Elm IgE: 0.17 kU/L — ABNORMAL HIGH
IgE (Immunoglobulin E), Serum: 46 kU/L (ref <=114)
Johnson Grass: 0.74 kU/L — ABNORMAL HIGH
Pecan/Hickory Tree IgE: 2.19 kU/L — ABNORMAL HIGH
Rough Pigweed  IgE: <0.1 kU/L
Sheep Sorrel IgE: <0.1 kU/L
Timothy Grass: 3.03 kU/L — ABNORMAL HIGH

## 2018-04-04 LAB — INTERPRETATION:

## 2018-04-10 ENCOUNTER — Encounter

## 2018-04-11 ENCOUNTER — Ambulatory Visit: Admitting: Family Medicine

## 2018-04-11 NOTE — Progress Notes (Deleted)
No chief complaint on file.   HPI  4 review of systems  Past Medical History:  Diagnosis Date  . Allergy   . Asthma   . GERD (gastroesophageal reflux disease)   . History of multiple allergies     Current Outpatient Medications  Medication Sig Dispense Refill  . albuterol (PROVENTIL HFA;VENTOLIN HFA) 108 (90 Base) MCG/ACT inhaler Inhale 2 puffs into the lungs every 6 (six) hours as needed for wheezing or shortness of breath.    . budesonide-formoterol (SYMBICORT) 160-4.5 MCG/ACT inhaler Inhale 2 puffs into the lungs 2 (two) times daily.    . cetirizine (ZYRTEC) 10 MG tablet Take 10 mg by mouth at bedtime.  0   Current Facility-Administered Medications  Medication Dose Route Frequency Provider Last Rate Last Dose  . 0.9 %  sodium chloride infusion  500 mL Intravenous Once Armbruster, Willaim Rayas, MD        Allergies:  Allergies  Allergen Reactions  . Shellfish Allergy Anaphylaxis  . Latex Itching    Past Surgical History:  Procedure Laterality Date  . 24 HOUR PH STUDY N/A 10/10/2017   Procedure: 24 HOUR PH STUDY-OFF of PPI;  Surgeon: Napoleon Form, MD;  Location: [facility];  Service: Endoscopy;  Laterality: N/A;  . DILATION AND CURETTAGE OF UTERUS    . ESOPHAGEAL MANOMETRY N/A 10/10/2017   Procedure: ESOPHAGEAL MANOMETRY (EM);  Surgeon: Napoleon Form, MD;  Location: [facility];  Service: Endoscopy;  Laterality: N/A;  . PH IMPEDANCE STUDY N/A 10/10/2017   Procedure: PH IMPEDANCE STUDY-OFF of PPI;  Surgeon: Napoleon Form, MD;  Location: [facility];  Service: Endoscopy;  Laterality: N/A;    Social History   Socioeconomic History  . Marital status: Married    Spouse name: Not on file  . Number of children: Not on file  . Years of education: Not on file  . Highest education level: Not on file  Occupational History  . Not on file  Social Needs  . Financial resource strain: Not on file  . Food insecurity:    Worry: Not on file    Inability: Not on  file  . Transportation needs:    Medical: Not on file    Non-medical: Not on file  Tobacco Use  . Smoking status: Never Smoker  . Smokeless tobacco: Never Used  Substance and Sexual Activity  . Alcohol use: Yes    Comment: 2-3 times a month.   . Drug use: No  . Sexual activity: Yes    Partners: Female    Birth control/protection: None  Lifestyle  . Physical activity:    Days per week: Not on file    Minutes per session: Not on file  . Stress: Not on file  Relationships  . Social connections:    Talks on phone: Not on file    Gets together: Not on file    Attends religious service: Not on file    Active member of club or organization: Not on file    Attends meetings of clubs or organizations: Not on file    Relationship status: Not on file  Other Topics Concern  . Not on file  Social History Narrative  . Not on file    Family History  Problem Relation Age of Onset  . Colon cancer Father   . Cervical polyp Sister   . Diabetes Maternal Grandmother   . Diabetes Maternal Grandfather   . Diabetes Paternal Grandmother   . Cervical cancer Paternal Grandmother   .  Diabetes Paternal Grandfather   . Colon cancer Paternal Grandfather   . Colon cancer Paternal Aunt   . Colon cancer Paternal Aunt   . Esophageal cancer Neg Hx   . Stomach cancer Neg Hx   . Rectal cancer Neg Hx      ROS Review of Systems See HPI Constitution: No fevers or chills No malaise No diaphoresis Skin: No rash or itching Eyes: no blurry vision, no double vision GU: no dysuria or hematuria Neuro: no dizziness or headaches * all others reviewed and negative   Objective: There were no vitals filed for this visit.  Physical Exam  Assessment and Plan There are no diagnoses linked to this encounter.   Amy Reed [employer]

## 2018-04-17 ENCOUNTER — Encounter

## 2018-05-29 ENCOUNTER — Other Ambulatory Visit: Payer: Self-pay

## 2018-05-29 ENCOUNTER — Encounter (HOSPITAL_BASED_OUTPATIENT_CLINIC_OR_DEPARTMENT_OTHER): Payer: Self-pay

## 2018-05-29 ENCOUNTER — Emergency Department (HOSPITAL_BASED_OUTPATIENT_CLINIC_OR_DEPARTMENT_OTHER)
Admission: EM | Admit: 2018-05-29 | Discharge: 2018-05-29 | Disposition: A | Discharge status: Home / Self Care | Attending: Emergency Medicine | Admitting: Emergency Medicine

## 2018-05-29 DIAGNOSIS — Z91040 Latex allergy status: Secondary | ICD-10-CM | POA: Insufficient documentation

## 2018-05-29 DIAGNOSIS — R0602 Shortness of breath: Secondary | ICD-10-CM | POA: Diagnosis present

## 2018-05-29 DIAGNOSIS — J4541 Moderate persistent asthma with (acute) exacerbation: Secondary | ICD-10-CM | POA: Insufficient documentation

## 2018-05-29 DIAGNOSIS — Z79899 Other long term (current) drug therapy: Secondary | ICD-10-CM | POA: Insufficient documentation

## 2018-05-29 MED ORDER — IPRATROPIUM-ALBUTEROL 0.5-2.5 (3) MG/3ML IN SOLN
3.0000 mL | RESPIRATORY_TRACT | Status: AC
  Administered 2018-05-29 (×2): 3 mL via RESPIRATORY_TRACT
  Filled 2018-05-29: qty 6

## 2018-05-29 MED ORDER — DEXAMETHASONE 6 MG PO TABS
10.0000 mg | ORAL_TABLET | Freq: Once | ORAL | Status: AC
  Administered 2018-05-29: 10 mg via ORAL
  Filled 2018-05-29: qty 1

## 2018-05-29 NOTE — Discharge Instructions (Signed)
Use your inhaler every 4 hours(6 puffs) while awake, return for sudden worsening shortness of breath, or if you need to use your inhaler more often.  ° °

## 2018-05-29 NOTE — Progress Notes (Signed)
Patient's peak flow of 350 is 71% of predicted.

## 2018-05-29 NOTE — ED Provider Notes (Signed)
[facility] EMERGENCY DEPARTMENT Provider Note   CSN: [id number] Arrival date & time: 05/29/18  2019     History   Chief Complaint Chief Complaint  Patient presents with  . Asthma    HPI Amy Reed is a 32 y.o. female.  32 yo F with a chief complaint of shortness of breath.  This been going on for the past couple days.  She thinks it may be similar to her asthma though stridor breathing treatments without improvement at home.  She denies fevers or chills denies chest pain.  The history is provided by the patient.  Asthma  This is a new problem. The current episode started yesterday. The problem occurs constantly. The problem has not changed since onset.Associated symptoms include shortness of breath. Pertinent negatives include no chest pain and no headaches. Nothing aggravates the symptoms. Nothing relieves the symptoms. She has tried nothing for the symptoms. The treatment provided no relief.    Past Medical History:  Diagnosis Date  . Allergy   . Asthma   . GERD (gastroesophageal reflux disease)   . History of multiple allergies     Patient Active Problem List   Diagnosis Date Noted  . Family hx of colon cancer requiring screening colonoscopy 09/11/2017  . Dysphagia 09/11/2017  . Globus sensation 09/11/2017  . Cough, persistent 08/07/2017  . Gastroesophageal reflux disease without esophagitis 08/07/2017  . Candidiasis of vulva and vagina 01/06/2014  . BV (bacterial vaginosis) 01/06/2014  . Vaginal irritation 01/06/2014    Past Surgical History:  Procedure Laterality Date  . 24 HOUR PH STUDY N/A 10/10/2017   Procedure: 24 HOUR PH STUDY-OFF of PPI;  Surgeon: Napoleon Form, MD;  Location: [facility];  Service: Endoscopy;  Laterality: N/A;  . DILATION AND CURETTAGE OF UTERUS    . ESOPHAGEAL MANOMETRY N/A 10/10/2017   Procedure: ESOPHAGEAL MANOMETRY (EM);  Surgeon: Napoleon Form, MD;  Location: [facility];  Service: Endoscopy;  Laterality:  N/A;  . PH IMPEDANCE STUDY N/A 10/10/2017   Procedure: PH IMPEDANCE STUDY-OFF of PPI;  Surgeon: Napoleon Form, MD;  Location: [facility];  Service: Endoscopy;  Laterality: N/A;     OB History    Gravida  2   Para  1   Term      Preterm  1   AB  1   Living        SAB  1   TAB      Ectopic      Multiple      Live Births               Home Medications    Prior to Admission medications   Medication Sig Start Date End Date Taking? Authorizing Provider  albuterol (PROVENTIL HFA;VENTOLIN HFA) 108 (90 Base) MCG/ACT inhaler Inhale 2 puffs into the lungs every 6 (six) hours as needed for wheezing or shortness of breath.    [provider]  budesonide-formoterol (SYMBICORT) 160-4.5 MCG/ACT inhaler Inhale 2 puffs into the lungs 2 (two) times daily.    [provider]  cetirizine (ZYRTEC) 10 MG tablet Take 10 mg by mouth at bedtime. 08/02/17   [provider]    Family History Family History  Problem Relation Age of Onset  . Colon cancer Father   . Cervical polyp Sister   . Diabetes Maternal Grandmother   . Diabetes Maternal Grandfather   . Diabetes Paternal Grandmother   . Cervical cancer Paternal Grandmother   . Diabetes  Paternal Grandfather   . Colon cancer Paternal Grandfather   . Colon cancer Paternal Aunt   . Colon cancer Paternal Aunt   . Esophageal cancer Neg Hx   . Stomach cancer Neg Hx   . Rectal cancer Neg Hx     Social History Social History   Tobacco Use  . Smoking status: Never Smoker  . Smokeless tobacco: Never Used  Substance Use Topics  . Alcohol use: Yes    Comment: 2-3 times a month.   . Drug use: No     Allergies   Shellfish allergy and Latex   Review of Systems Review of Systems  Constitutional: Negative for chills and fever.  HENT: Negative for congestion and rhinorrhea.   Eyes: Negative for redness and visual disturbance.  Respiratory: Positive for shortness of breath. Negative for wheezing.    Cardiovascular: Negative for chest pain and palpitations.  Gastrointestinal: Negative for nausea and vomiting.  Genitourinary: Negative for dysuria and urgency.  Musculoskeletal: Negative for arthralgias and myalgias.  Skin: Negative for pallor and wound.  Neurological: Negative for dizziness and headaches.     Physical Exam Updated Vital Signs BP 121/77 (BP Location: Left Arm)   Pulse 68   Temp 98.1 F (36.7 C) (Oral)   Resp 18   Ht 5\' 7"  (1.702 m)   Wt 54.4 kg   LMP 05/22/2018   SpO2 100%   BMI 18.79 kg/m   Physical Exam  Constitutional: She is oriented to person, place, and time. She appears well-developed and well-nourished. No distress.  HENT:  Head: Normocephalic and atraumatic.  Eyes: Pupils are equal, round, and reactive to light. EOM are normal.  Neck: Normal range of motion. Neck supple.  Cardiovascular: Normal rate and regular rhythm. Exam reveals no gallop and no friction rub.  No murmur heard. Pulmonary/Chest: Effort normal. She has no wheezes. She has no rales.  Diminished breath sounds in all fields, poor inspiratory effort.  Abdominal: Soft. She exhibits no distension. There is no tenderness.  Musculoskeletal: She exhibits no edema or tenderness.  Neurological: She is alert and oriented to person, place, and time.  Skin: Skin is warm and dry. She is not diaphoretic.  Psychiatric: She has a normal mood and affect. Her behavior is normal.  Nursing note and vitals reviewed.    ED Treatments / Results  Labs (all labs ordered are listed, but only abnormal results are displayed) Labs Reviewed - No data to display  EKG None  Radiology No results found.  Procedures Procedures (including critical care time)  Medications Ordered in ED Medications  ipratropium-albuterol (DUONEB) 0.5-2.5 (3) MG/3ML nebulizer solution 3 mL (3 mLs Nebulization Given 05/29/18 2133)  dexamethasone (DECADRON) tablet 10 mg (10 mg Oral Given 05/29/18 2129)     Initial  Impression / Assessment and Plan / ED Course  I have reviewed the triage vital signs and the nursing notes.  Pertinent labs & imaging results that were available during my care of the patient were reviewed by me and considered in my medical decision making (see chart for details).     32 yo F with a chief complaint of shortness of breath. Going on for the past couple days.  She has slightly diminished breath sounds bilaterally.  Was giving a breathing treatment with improvement.  Given Decadron as well.  Some improvement.  D/c home.   10:13 PM:  I have discussed the diagnosis/risks/treatment options with the patient and family and believe the pt to be eligible for discharge  home to follow-up with PCP. We also discussed returning to the ED immediately if new or worsening sx occur. We discussed the sx which are most concerning (e.g., sudden worsening pain, fever, inability to tolerate by mouth) that necessitate immediate return. Medications administered to the patient during their visit and any new prescriptions provided to the patient are listed below.  Medications given during this visit Medications  ipratropium-albuterol (DUONEB) 0.5-2.5 (3) MG/3ML nebulizer solution 3 mL (3 mLs Nebulization Given 05/29/18 2133)  dexamethasone (DECADRON) tablet 10 mg (10 mg Oral Given 05/29/18 2129)     The patient appears reasonably screen and/or stabilized for discharge and I doubt any other medical condition or other [facility] requiring further screening, evaluation, or treatment in the ED at this time prior to discharge.    Final Clinical Impressions(s) / ED Diagnoses   Final diagnoses:  Moderate persistent asthma with exacerbation    ED Discharge Orders    None       Melene Plan, DO 05/29/18 2214

## 2018-05-29 NOTE — ED Triage Notes (Signed)
Pt c/o SOB that started prior to arrival and her rescue inhaler was not helping her, lungs are clear, c/o tightness in chest

## 2018-06-03 NOTE — Progress Notes (Deleted)
Cardiology Office Note:    Date:  06/03/2018   ID:  Amy Reed, DOB 07/15/1986, MRN [id number]  PCP:  Doristine Bosworth, MD  Cardiologist:  No primary care provider on file.   Referring MD: Renaye Rakers, MD   No chief complaint on file. ***  History of Present Illness:    Amy Reed is a 32 y.o. female with a hx of palpitations and syncope referred by  ***  Past Medical History:  Diagnosis Date  . Allergy   . Asthma   . GERD (gastroesophageal reflux disease)   . History of multiple allergies     Past Surgical History:  Procedure Laterality Date  . 24 HOUR PH STUDY N/A 10/10/2017   Procedure: 24 HOUR PH STUDY-OFF of PPI;  Surgeon: Napoleon Form, MD;  Location: [facility];  Service: Endoscopy;  Laterality: N/A;  . DILATION AND CURETTAGE OF UTERUS    . ESOPHAGEAL MANOMETRY N/A 10/10/2017   Procedure: ESOPHAGEAL MANOMETRY (EM);  Surgeon: Napoleon Form, MD;  Location: [facility];  Service: Endoscopy;  Laterality: N/A;  . PH IMPEDANCE STUDY N/A 10/10/2017   Procedure: PH IMPEDANCE STUDY-OFF of PPI;  Surgeon: Napoleon Form, MD;  Location: [facility];  Service: Endoscopy;  Laterality: N/A;    Current Medications: No outpatient medications have been marked as taking for the 06/04/18 encounter (Appointment) with Lyn Records, MD.   Current Facility-Administered Medications for the 06/04/18 encounter (Appointment) with Lyn Records, MD  Medication  . 0.9 %  sodium chloride infusion     Allergies:   Shellfish allergy and Latex   Social History   Socioeconomic History  . Marital status: Married    Spouse name: Not on file  . Number of children: Not on file  . Years of education: Not on file  . Highest education level: Not on file  Occupational History  . Not on file  Social Needs  . Financial resource strain: Not on file  . Food insecurity:    Worry: Not on file    Inability: Not on file  . Transportation needs:    Medical: Not on file      Non-medical: Not on file  Tobacco Use  . Smoking status: Never Smoker  . Smokeless tobacco: Never Used  Substance and Sexual Activity  . Alcohol use: Yes    Comment: 2-3 times a month.   . Drug use: No  . Sexual activity: Yes    Partners: Female    Birth control/protection: None  Lifestyle  . Physical activity:    Days per week: Not on file    Minutes per session: Not on file  . Stress: Not on file  Relationships  . Social connections:    Talks on phone: Not on file    Gets together: Not on file    Attends religious service: Not on file    Active member of club or organization: Not on file    Attends meetings of clubs or organizations: Not on file    Relationship status: Not on file  Other Topics Concern  . Not on file  Social History Narrative  . Not on file     Family History: The patient's ***family history includes Cervical cancer in her paternal grandmother; Cervical polyp in her sister; Colon cancer in her father, paternal aunt, paternal aunt, and paternal grandfather; Diabetes in her maternal grandfather, maternal grandmother, paternal grandfather, and paternal grandmother. There is no history of Esophageal cancer, Stomach  cancer, or Rectal cancer.  ROS:   Please see the history of present illness.    *** All other systems reviewed and are negative.  EKGs/Labs/Other Studies Reviewed:    The following studies were reviewed today: ***  EKG:  EKG is *** ordered today.  The ekg ordered today demonstrates ***  Recent Labs: 10/31/2017: ALT 13; Magnesium 2.0; NT-Pro BNP <5; TSH 1.430 02/18/2018: BUN 9; Creatinine, Ser 0.91; Hemoglobin 13.6; Platelets 201; Potassium 3.8; Sodium 138  Recent Lipid Panel No results found for: CHOL, TRIG, HDL, CHOLHDL, VLDL, LDLCALC, LDLDIRECT  Physical Exam:    VS:  LMP 05/22/2018     Wt Readings from Last 3 Encounters:  05/29/18 120 lb (54.4 kg)  04/03/18 124 lb 9.6 oz (56.5 kg)  02/18/18 125 lb (56.7 kg)     GEN: *** Well  nourished, well developed in no acute distress HEENT: Normal NECK: No JVD. LYMPHATICS: No lymphadenopathy CARDIAC: ***RRR, ***murmur, ***gallop, *** edema. VASCULAR: *** pulses. *** bruits. RESPIRATORY:  Clear to auscultation without rales, wheezing or rhonchi  ABDOMEN: Soft, non-tender, non-distended, No pulsatile mass, MUSCULOSKELETAL: No deformity  SKIN: Warm and dry NEUROLOGIC:  Alert and oriented x 3 PSYCHIATRIC:  Normal affect   ASSESSMENT:    1. Rapid heart rate   2. Syncope, unspecified syncope type    PLAN:    In order of problems listed above:  1. ***   Medication Adjustments/Labs and Tests Ordered: Current medicines are reviewed at length with the patient today.  Concerns regarding medicines are outlined above.  No orders of the defined types were placed in this encounter.  No orders of the defined types were placed in this encounter.   There are no Patient Instructions on file for this visit.   Signed, Lesleigh Noe, MD  06/03/2018 5:47 PM    [facility]

## 2018-06-04 ENCOUNTER — Ambulatory Visit: Admitting: Interventional Cardiology

## 2018-06-05 ENCOUNTER — Other Ambulatory Visit: Payer: Self-pay

## 2018-06-05 ENCOUNTER — Ambulatory Visit (INDEPENDENT_AMBULATORY_CARE_PROVIDER_SITE_OTHER): Admitting: Family Medicine

## 2018-06-05 ENCOUNTER — Encounter: Payer: Self-pay | Admitting: Interventional Cardiology

## 2018-06-05 ENCOUNTER — Encounter: Payer: Self-pay | Admitting: Family Medicine

## 2018-06-05 VITALS — BP 117/81 | HR 83 | Temp 98.0°F | Resp 17 | Ht 67.0 in | Wt 125.6 lb

## 2018-06-05 DIAGNOSIS — F411 Generalized anxiety disorder: Secondary | ICD-10-CM | POA: Diagnosis not present

## 2018-06-05 DIAGNOSIS — R0602 Shortness of breath: Secondary | ICD-10-CM

## 2018-06-05 DIAGNOSIS — Z800 Family history of malignant neoplasm of digestive organs: Secondary | ICD-10-CM | POA: Diagnosis not present

## 2018-06-05 DIAGNOSIS — E559 Vitamin D deficiency, unspecified: Secondary | ICD-10-CM

## 2018-06-05 DIAGNOSIS — Z23 Encounter for immunization: Secondary | ICD-10-CM | POA: Diagnosis not present

## 2018-06-05 MED ORDER — CLONAZEPAM 0.5 MG PO TABS: 0.5000 mg | ORAL_TABLET | Freq: Two times a day (BID) | ORAL | 1 refills | Status: DC | PRN

## 2018-06-05 NOTE — Patient Instructions (Addendum)
If you have lab work done today you will be contacted with your lab results within the next 2 weeks.  If you have not heard from [place] then please contact us. The fastest way to get your results is to register for My Chart.   IF you received an x-ray today, you will receive an invoice from [facility]. Please contact [facility] at [phone] with questions or concerns regarding your invoice.   IF you received labwork today, you will receive an invoice from [facility]. Please contact [facility] at [phone] with questions or concerns regarding your invoice.   Our billing staff will not be able to assist you with questions regarding bills from these companies.  You will be contacted with the lab results as soon as they are available. The fastest way to get your results is to activate your My Chart account. Instructions are located on the last page of this paperwork. If you have not heard from [place] regarding the results in 2 weeks, please contact this office.     Lynch Syndrome Lynch syndrome, also called hereditary nonpolyposis colorectal cancer (HNPCC), is a condition that increases a person's risk for developing colorectal cancer before age 33. Lynch syndrome can also increase a person's risk for many other types of cancer, including stomach, small intestine, liver, gallbladder, pancreas, urinary tract, skin, and brain cancers. Women with this condition have a higher risk for developing cancer of the ovaries and cancer in the lining of the uterus (endometrium). What are the causes? This condition is caused by a gene mutation that is inherited from one or both parents. A gene mutation is a harmful change in a gene. Not everyone who inherits the genetic mutation develops cancer. What are the signs or symptoms? There are no symptoms of this condition. However, your health care provider may test you for Lynch syndrome if you:  Have colorectal cancer before age 83.  Have  family members diagnosed with colorectal, endometrial, or other types of cancer.  How is this diagnosed? This condition is diagnosed with:  A review of your family history of cancer.  A blood test to look for the mutations that cause this condition.  Testing of tumor tissue (biopsy).  How is this treated? This condition may be managed with:  Genetic counseling to assess your risk and your options for management.  Regular screening tests for the associated cancers. You may need to have a colonoscopy every 1-2 years starting at an early age.  Daily aspirin therapy.  Preventive surgery to remove sites where cancer can develop, such as the colon, uterus, and ovaries.  Follow these instructions at home:  Ask your health care provider about your risks.  Discuss a referral for genetic counseling. Ask about the risks and benefits of genetic counseling.  Write down any questions you have about your condition.  Follow your plan for cancer screenings as told by your health care provider.  Take over-the-counter and prescription medicines only as told by your health care provider.  Maintain a healthy diet.  Consider joining a support group. This may help you learn to cope with the stress of having Lynch syndrome.  Keep all follow-up visits as told by your health care provider. This is important. Contact a health care provider if:  You develop any new or unusual symptoms.  You develop symptoms of colorectal cancer, such as: ? Blood in the stool. ? Changes in bowel habits. ? Abdominal pain or bloating. ? Unexplained weight loss.  Summary  Lynch syndrome is caused by an inherited gene mutation. Not everyone who inherits this mutation will develop cancer.  Genetic counseling and blood testing for Lynch syndrome can identify people with the condition.  Regular cancer screening tests are important in managing this condition. This information is not intended to replace advice given  to you by your health care provider. Make sure you discuss any questions you have with your health care provider. Document Released: 04/14/2016 Document Revised: 04/20/2016 Document Reviewed: 04/14/2016 Elsevier Interactive Patient Education  [employer].

## 2018-06-05 NOTE — Progress Notes (Signed)
Chief Complaint  Patient presents with  . chest concerns not better and pt continues to have dyspnea,     HPI Asthma? Anxiety Pt reports that at night she wakes up gasping for air She denies any improvement in her symptoms but reports that the weather change has made it even worse She is doing symbicort and zyrtec She has been using the albuterol more lately She is her albuterol rescue bid She reports that she has difficulty when she has to exhale more than with inhalation She states that sometimes in the middle of the night she wakes up trying to catch her breath She had allergy testing showed that she is allergy to grasses and trees She was advised to get allergy shots She has now developed more anxiety regarding these symptoms  Family History of Colon Cancer She also has a history of Lynch Syndrome She states that her father told her to talk to her doctor Her father has lynch syndrome and his mother had colon cancer, he has had 4 bouts starting at age 61, her maternal auntsx2 and 4 of the patient's siblings have had colon cancer.  She had a colonoscopy and EGD 09/14/2017 It was negative for polyps and had no abnormality Due to her family history she was advised to follow up in 5 years     Past Medical History:  Diagnosis Date  . Allergy   . Asthma   . GERD (gastroesophageal reflux disease)   . History of multiple allergies     Current Outpatient Medications  Medication Sig Dispense Refill  . albuterol (PROVENTIL HFA;VENTOLIN HFA) 108 (90 Base) MCG/ACT inhaler Inhale 2 puffs into the lungs every 6 (six) hours as needed for wheezing or shortness of breath.    . budesonide-formoterol (SYMBICORT) 160-4.5 MCG/ACT inhaler Inhale 2 puffs into the lungs 2 (two) times daily.    . cetirizine (ZYRTEC) 10 MG tablet Take 10 mg by mouth at bedtime.  0  . clonazePAM (KLONOPIN) 0.5 MG tablet Take 1 tablet (0.5 mg total) by mouth 2 (two) times daily as needed for anxiety. 20 tablet 1     Current Facility-Administered Medications  Medication Dose Route Frequency Provider Last Rate Last Dose  . 0.9 %  sodium chloride infusion  500 mL Intravenous Once Armbruster, Willaim Rayas, MD        Allergies:  Allergies  Allergen Reactions  . Shellfish Allergy Anaphylaxis  . Latex Itching    Past Surgical History:  Procedure Laterality Date  . 24 HOUR PH STUDY N/A 10/10/2017   Procedure: 24 HOUR PH STUDY-OFF of PPI;  Surgeon: Napoleon Form, MD;  Location: [facility];  Service: Endoscopy;  Laterality: N/A;  . DILATION AND CURETTAGE OF UTERUS    . ESOPHAGEAL MANOMETRY N/A 10/10/2017   Procedure: ESOPHAGEAL MANOMETRY (EM);  Surgeon: Napoleon Form, MD;  Location: [facility];  Service: Endoscopy;  Laterality: N/A;  . PH IMPEDANCE STUDY N/A 10/10/2017   Procedure: PH IMPEDANCE STUDY-OFF of PPI;  Surgeon: Napoleon Form, MD;  Location: [facility];  Service: Endoscopy;  Laterality: N/A;    Social History   Socioeconomic History  . Marital status: Married    Spouse name: Not on file  . Number of children: Not on file  . Years of education: Not on file  . Highest education level: Not on file  Occupational History  . Not on file  Social Needs  . Financial resource strain: Not on file  . Food insecurity:  Worry: Not on file    Inability: Not on file  . Transportation needs:    Medical: Not on file    Non-medical: Not on file  Tobacco Use  . Smoking status: Never Smoker  . Smokeless tobacco: Never Used  Substance and Sexual Activity  . Alcohol use: Yes    Comment: 2-3 times a month.   . Drug use: No  . Sexual activity: Yes    Partners: Female    Birth control/protection: None  Lifestyle  . Physical activity:    Days per week: Not on file    Minutes per session: Not on file  . Stress: Not on file  Relationships  . Social connections:    Talks on phone: Not on file    Gets together: Not on file    Attends religious service: Not on file    Active  member of club or organization: Not on file    Attends meetings of clubs or organizations: Not on file    Relationship status: Not on file  Other Topics Concern  . Not on file  Social History Narrative  . Not on file    Family History  Problem Relation Age of Onset  . Colon cancer Father   . Cervical polyp Sister   . Diabetes Maternal Grandmother   . Diabetes Maternal Grandfather   . Diabetes Paternal Grandmother   . Cervical cancer Paternal Grandmother   . Diabetes Paternal Grandfather   . Colon cancer Paternal Grandfather   . Colon cancer Paternal Aunt   . Colon cancer Paternal Aunt   . Esophageal cancer Neg Hx   . Stomach cancer Neg Hx   . Rectal cancer Neg Hx      ROS Review of Systems See HPI Constitution: No fevers or chills No malaise No diaphoresis Skin: No rash or itching Eyes: no blurry vision, no double vision GU: no dysuria or hematuria Neuro: no dizziness or headaches all others reviewed and negative   Objective: Vitals:   06/05/18 0919  BP: 117/81  Pulse: 83  Resp: 17  Temp: 98 F (36.7 C)  TempSrc: Oral  SpO2: 100%  Weight: 125 lb 9.6 oz (57 kg)  Height: 5\' 7"  (1.702 m)    Physical Exam General: alert, oriented, in NAD Head: normocephalic, atraumatic, no sinus tenderness Eyes: EOM intact, no scleral icterus or conjunctival injection Ears: TM clear bilaterally Nose: mucosa nonerythematous, nonedematous Throat: no pharyngeal exudate or erythema Lymph: no posterior auricular, submental or cervical lymph adenopathy Heart: normal rate, normal sinus rhythm, no murmurs Lungs: clear to auscultation bilaterally, no wheezing   Assessment and Plan Amy Reed was seen today for chest concerns not better and pt continues to have dyspnea, .  Diagnoses and all orders for this visit:  Shortness of breath- concern for allergy induced asthma and anxiety -     clonazePAM (KLONOPIN) 0.5 MG tablet; Take 1 tablet (0.5 mg total) by mouth 2 (two) times daily  as needed for anxiety.  Family history of Lynch syndrome- sent a message to Dr. Adela Lank to ask about frequency of colonoscopies or other screenings  She already had EGD and Colonscopy 09/14/2017  Anxiety state- will do a trial of klonopin as anxiety might be triggering her breathing -     clonazePAM (KLONOPIN) 0.5 MG tablet; Take 1 tablet (0.5 mg total) by mouth 2 (two) times daily as needed for anxiety.  Vitamin D insufficiency- will recheck levels, pt taking vitamin D supplement -  Vitamin D, 25-hydroxy  Needs flu shot -     Flu Vaccine QUAD 36+ mos IM  Need for Tdap vaccination -     Tdap vaccine greater than or equal to 7yo IM   A total of 35 minutes were spent face-to-face with the patient during this encounter and over half of that time was spent on counseling and coordination of care.  Reviewed colonoscopy and EGD Reviewed family history Discussed lynch syndrome Reviewed immunizations and advised vaccinations Discussed anxiety and how it worsens asthma   Amy Reed A Creta Levin

## 2018-06-06 LAB — VITAMIN D 25 HYDROXY (VIT D DEFICIENCY, FRACTURES): Vit D, 25-Hydroxy: 36.5 ng/mL (ref 30.0–100.0)

## 2018-06-26 ENCOUNTER — Ambulatory Visit: Admitting: Family Medicine

## 2019-05-16 ENCOUNTER — Encounter (HOSPITAL_BASED_OUTPATIENT_CLINIC_OR_DEPARTMENT_OTHER): Payer: Self-pay | Admitting: *Deleted

## 2019-05-16 ENCOUNTER — Other Ambulatory Visit: Payer: Self-pay

## 2019-05-16 ENCOUNTER — Emergency Department (HOSPITAL_BASED_OUTPATIENT_CLINIC_OR_DEPARTMENT_OTHER)
Admission: EM | Admit: 2019-05-16 | Discharge: 2019-05-16 | Disposition: A | Discharge status: Home / Self Care | Attending: Emergency Medicine | Admitting: Emergency Medicine

## 2019-05-16 DIAGNOSIS — Z91040 Latex allergy status: Secondary | ICD-10-CM | POA: Diagnosis not present

## 2019-05-16 DIAGNOSIS — R0789 Other chest pain: Secondary | ICD-10-CM | POA: Diagnosis not present

## 2019-05-16 DIAGNOSIS — M549 Dorsalgia, unspecified: Secondary | ICD-10-CM | POA: Diagnosis present

## 2019-05-16 DIAGNOSIS — Z79899 Other long term (current) drug therapy: Secondary | ICD-10-CM | POA: Insufficient documentation

## 2019-05-16 DIAGNOSIS — J45909 Unspecified asthma, uncomplicated: Secondary | ICD-10-CM | POA: Insufficient documentation

## 2019-05-16 LAB — URINALYSIS, ROUTINE W REFLEX MICROSCOPIC
Bilirubin Urine: NEGATIVE
Glucose, UA: NEGATIVE mg/dL
Hgb urine dipstick: NEGATIVE
Ketones, ur: NEGATIVE mg/dL
Leukocytes,Ua: NEGATIVE
Nitrite: NEGATIVE
Protein, ur: NEGATIVE mg/dL
Specific Gravity, Urine: 1.025 (ref 1.005–1.030)
pH: 6 (ref 5.0–8.0)

## 2019-05-16 LAB — PREGNANCY, URINE: Preg Test, Ur: NEGATIVE

## 2019-05-16 MED ORDER — HYDROCODONE-ACETAMINOPHEN 5-325 MG PO TABS: 1.0000 | ORAL_TABLET | Freq: Four times a day (QID) | ORAL | 0 refills | Status: DC | PRN

## 2019-05-16 MED ORDER — CYCLOBENZAPRINE HCL 10 MG PO TABS: 10.0000 mg | ORAL_TABLET | Freq: Three times a day (TID) | ORAL | 0 refills | Status: DC | PRN

## 2019-05-16 NOTE — ED Triage Notes (Signed)
Pt reports left lower back pain-increased with movement and palpation x 1 day. Denies urinary symptoms.

## 2019-05-16 NOTE — ED Provider Notes (Signed)
[facility] Provider Note: Amy Spurling, MD, FACEP  CSN: [id number] MRN: [id number] ARRIVAL: 05/16/19 at [place]: [place]  05/16/19 12:57 AM Amy Reed is a 33 y.o. female with a one-week history of pain that began in her lower chest and is now primarily in her mid left back.  She rates the pain as a 10 out of 10, worse with movement and certain positions.  There is no associated rash.  The pain is aching in nature.  She denies dysuria.  She denies injury or heavy lifting.   Past Medical History:  Diagnosis Date  . Allergy   . Asthma   . GERD (gastroesophageal reflux disease)   . History of multiple allergies     Past Surgical History:  Procedure Laterality Date  . [place] STUDY N/A 10/10/2017   Procedure: 24 HOUR PH STUDY-OFF of PPI;  Surgeon: Mauri Pole, MD;  Location: [facility];  Service: Endoscopy;  Laterality: N/A;  . DILATION AND CURETTAGE OF UTERUS    . ESOPHAGEAL MANOMETRY N/A 10/10/2017   Procedure: ESOPHAGEAL MANOMETRY (EM);  Surgeon: Mauri Pole, MD;  Location: [facility];  Service: Endoscopy;  Laterality: N/A;  . [place] IMPEDANCE STUDY N/A 10/10/2017   Procedure: PH IMPEDANCE STUDY-OFF of PPI;  Surgeon: Mauri Pole, MD;  Location: [facility];  Service: Endoscopy;  Laterality: N/A;    Family History  Problem Relation Age of Onset  . Colon cancer Father   . Cervical polyp Sister   . Diabetes Maternal Grandmother   . Diabetes Maternal Grandfather   . Diabetes Paternal Grandmother   . Cervical cancer Paternal Grandmother   . Diabetes Paternal Grandfather   . Colon cancer Paternal Grandfather   . Colon cancer Paternal Aunt   . Colon cancer Paternal Aunt   . Esophageal cancer Neg Hx   . Stomach cancer Neg Hx   . Rectal cancer Neg Hx     Social History   Tobacco Use  . Smoking status: Never Smoker  . Smokeless tobacco: Never Used  Substance Use  Topics  . Alcohol use: Yes    Comment: 2-3 times a month.   . Drug use: No    Prior to Admission medications   Medication Sig Start Date End Date Taking? Authorizing Provider  albuterol (PROVENTIL HFA;VENTOLIN HFA) 108 (90 Base) MCG/ACT inhaler Inhale 2 puffs into the lungs every 6 (six) hours as needed for wheezing or shortness of breath.    [provider]  budesonide-formoterol (SYMBICORT) 160-4.5 MCG/ACT inhaler Inhale 2 puffs into the lungs 2 (two) times daily.    [provider]  cetirizine (ZYRTEC) 10 MG tablet Take 10 mg by mouth at bedtime. 08/02/17   [provider]  clonazePAM (KLONOPIN) 0.5 MG tablet Take 1 tablet (0.5 mg total) by mouth 2 (two) times daily as needed for anxiety. 06/05/18   Forrest Moron, MD    Allergies Shellfish allergy and Latex   REVIEW OF SYSTEMS  Negative except as noted here or in the History of Present Illness.   PHYSICAL EXAMINATION  Initial Vital Signs Blood pressure 116/65, pulse 74, temperature 98.2 F (36.8 C), temperature source Oral, resp. rate 18, height 5\' 6"  (1.676 m), weight 59 kg, last menstrual period 04/25/2019, SpO2 100 %.  Examination General: Well-developed, thin female in no acute distress; appearance consistent with age of record HENT: normocephalic; atraumatic Eyes: Normal appearance  Neck: supple Heart: regular rate and rhythm Lungs: clear to auscultation bilaterally Abdomen: soft; nondistended; nontender; bowel sounds present Back: Left mid paraspinal muscular tenderness; no associated rash or hyperesthesia Extremities: No deformity; full range of motion; pulses normal Neurologic: Awake, alert and oriented; motor function intact in all extremities and symmetric; no facial droop Skin: Warm and dry Psychiatric: Normal mood and affect   RESULTS  Summary of this visit's results, reviewed by myself:   EKG Interpretation  Date/Time:    Ventricular Rate:    PR Interval:    QRS Duration:    QT Interval:    QTC Calculation:   R Axis:     Text Interpretation:        Laboratory Studies: Results for orders placed or performed during the hospital encounter of 05/16/19 (from the past 24 hour(s))  Urinalysis, Routine w reflex microscopic     Status: None   Collection Time: 05/16/19 12:49 AM  Result Value Ref Range   Color, Urine YELLOW YELLOW   APPearance CLEAR CLEAR   Specific Gravity, Urine 1.025 1.005 - 1.030   pH 6.0 5.0 - 8.0   Glucose, UA NEGATIVE NEGATIVE mg/dL   Hgb urine dipstick NEGATIVE NEGATIVE   Bilirubin Urine NEGATIVE NEGATIVE   Ketones, ur NEGATIVE NEGATIVE mg/dL   Protein, ur NEGATIVE NEGATIVE mg/dL   Nitrite NEGATIVE NEGATIVE   Leukocytes,Ua NEGATIVE NEGATIVE  Pregnancy, urine     Status: None   Collection Time: 05/16/19 12:49 AM  Result Value Ref Range   Preg Test, Ur NEGATIVE NEGATIVE   Imaging Studies: No results found.  ED COURSE and MDM  Nursing notes and initial vitals signs, including pulse oximetry, reviewed.  Vitals:   05/16/19 0052 05/16/19 0053  BP: 116/65   Pulse: 74   Resp: 18   Temp: 98.2 F (36.8 C)   TempSrc: Oral   SpO2: 100%   Weight:  59 kg  Height:  5\' 6"  (1.676 m)   The focus of the patient's pain appears to be musculoskeletal on exam.  I do not believe this represents shingles as there is no rash or hyperesthesia and the pain is been present for about a week.  We will treat for muscle pain.  PROCEDURES    ED DIAGNOSES     ICD-10-CM   1. Musculoskeletal back pain  M54.9        Amy Kulesza, MD 05/16/19 [phone]

## 2019-08-20 ENCOUNTER — Ambulatory Visit: Admitting: Obstetrics

## 2019-08-26 ENCOUNTER — Ambulatory Visit: Admitting: Obstetrics

## 2019-12-01 ENCOUNTER — Ambulatory Visit (INDEPENDENT_AMBULATORY_CARE_PROVIDER_SITE_OTHER): Admitting: Obstetrics & Gynecology

## 2019-12-01 ENCOUNTER — Other Ambulatory Visit (HOSPITAL_COMMUNITY)
Admission: RE | Admit: 2019-12-01 | Discharge: 2019-12-01 | Disposition: A | Discharge status: Home / Self Care | Source: Ambulatory Visit | Attending: Obstetrics & Gynecology | Admitting: Obstetrics & Gynecology

## 2019-12-01 ENCOUNTER — Encounter: Payer: Self-pay | Admitting: Obstetrics & Gynecology

## 2019-12-01 ENCOUNTER — Other Ambulatory Visit: Payer: Self-pay

## 2019-12-01 VITALS — BP 117/82 | HR 85 | Ht 67.0 in | Wt 143.0 lb

## 2019-12-01 DIAGNOSIS — B356 Tinea cruris: Secondary | ICD-10-CM | POA: Diagnosis not present

## 2019-12-01 DIAGNOSIS — Z01419 Encounter for gynecological examination (general) (routine) without abnormal findings: Secondary | ICD-10-CM | POA: Insufficient documentation

## 2019-12-01 MED ORDER — FLUCONAZOLE 150 MG PO TABS: 150.0000 mg | ORAL_TABLET | Freq: Once | ORAL | 0 refills | Status: AC

## 2019-12-01 NOTE — Patient Instructions (Signed)

## 2019-12-01 NOTE — Progress Notes (Signed)
Patient ID: Amy Reed, female   DOB: 1985-10-19, 34 y.o.   MRN: [id number]  Chief Complaint  Patient presents with  . Gynecologic Exam    HPI Amy Reed is a 34 y.o. female.  [id number] Patient's last menstrual period was 11/10/2019. She has irregular menses, not using BCM and and does not request prescription for this today. She was diagnosed with ringworm in the vulvar, buttocks area and there has been some improvement. She requests STD screening and to check for right breast lump. HPI  Past Medical History:  Diagnosis Date  . Allergy   . Asthma   . GERD (gastroesophageal reflux disease)   . History of multiple allergies     Past Surgical History:  Procedure Laterality Date  . [place] STUDY N/A 10/10/2017   Procedure: 24 HOUR PH STUDY-OFF of PPI;  Surgeon: Mauri Pole, MD;  [facility]: [facility];  Service: Endoscopy;  Laterality: N/A;  . DILATION AND CURETTAGE OF UTERUS    . ESOPHAGEAL MANOMETRY N/A 10/10/2017   Procedure: ESOPHAGEAL MANOMETRY (EM);  Surgeon: Mauri Pole, MD;  [facility]: [facility];  Service: Endoscopy;  Laterality: N/A;  . [place] IMPEDANCE STUDY N/A 10/10/2017   Procedure: PH IMPEDANCE STUDY-OFF of PPI;  Surgeon: Mauri Pole, MD;  [facility]: [facility];  Service: Endoscopy;  Laterality: N/A;    Family History  Problem Relation Age of Onset  . Colon cancer Father   . Cervical polyp Sister   . Diabetes Maternal Grandmother   . Diabetes Maternal Grandfather   . Diabetes Paternal Grandmother   . Cervical cancer Paternal Grandmother   . Diabetes Paternal Grandfather   . Colon cancer Paternal Grandfather   . Colon cancer Paternal Aunt   . Colon cancer Paternal Aunt   . Esophageal cancer Neg Hx   . Stomach cancer Neg Hx   . Rectal cancer Neg Hx     Social History Social History   Tobacco Use  . Smoking status: Never Smoker  . Smokeless tobacco: Never Used  Substance Use Topics  . Alcohol use: Yes    Comment: 2-3 times  a month.   . Drug use: No    Allergies  Allergen Reactions  . Shellfish Allergy Anaphylaxis  . Latex Itching    Current Outpatient Medications  Medication Sig Dispense Refill  . albuterol (PROVENTIL HFA;VENTOLIN HFA) 108 (90 Base) MCG/ACT inhaler Inhale 2 puffs into the lungs every 6 (six) hours as needed for wheezing or shortness of breath.    . budesonide-formoterol (SYMBICORT) 160-4.5 MCG/ACT inhaler Inhale 2 puffs into the lungs 2 (two) times daily.    . cetirizine (ZYRTEC) 10 MG tablet Take 10 mg by mouth at bedtime.  0  . clonazePAM (KLONOPIN) 0.5 MG tablet Take 1 tablet (0.5 mg total) by mouth 2 (two) times daily as needed for anxiety. (Patient not taking: Reported on 12/01/2019) 20 tablet 1  . cyclobenzaprine (FLEXERIL) 10 MG tablet Take 1 tablet (10 mg total) by mouth 3 (three) times daily as needed for muscle spasms. (Patient not taking: Reported on 12/01/2019) 20 tablet 0  . fluconazole (DIFLUCAN) 150 MG tablet Take 1 tablet (150 mg total) by mouth once for 1 dose. 1 tablet 0  . HYDROcodone-acetaminophen (NORCO) 5-325 MG tablet Take 1 tablet by mouth every 6 (six) hours as needed (for pain). (Patient not taking: Reported on 12/01/2019) 20 tablet 0   Current Facility-Administered Medications  Medication Dose Route Frequency Provider Last Rate Last Admin  .  0.9 %  sodium chloride infusion  500 mL Intravenous Once Armbruster, Willaim Rayas, MD        Review of Systems Review of Systems  Constitutional: Negative.   Gastrointestinal: Negative.   Genitourinary: Positive for vaginal discharge. Negative for pelvic pain and vaginal bleeding.  Skin: Positive for rash (lesion over buttocks and vulvar improving).  Psychiatric/Behavioral: Negative.     Blood pressure 117/82, pulse 85, height 5\' 7"  (1.702 m), weight 143 lb (64.9 kg), last menstrual period 11/10/2019.  Physical Exam Physical Exam Vitals and nursing note reviewed. Exam conducted with a chaperone present.  Constitutional:       Appearance: Normal appearance. She is not ill-appearing.  Cardiovascular:     Rate and Rhythm: Normal rate.  Pulmonary:     Effort: Pulmonary effort is normal.  Abdominal:     General: Abdomen is flat.     Palpations: Abdomen is soft.  Genitourinary:    Comments: Pelvic exam: normal  vagina, cervix, uterus and adnexa, VULVA:  Dry circumscribed scattered lesions flat not red or tender over vulva and buttocks Musculoskeletal:     Cervical back: Normal range of motion.  Neurological:     Mental Status: She is alert.    Breasts: breasts appear normal, no suspicious masses, no skin or nipple changes or axillary nodes.  Data Reviewed Pap 2018 nl  Assessment Well exam pap done today, STD screening Possible tinea cruris s/p treatment topically, doubt herpes States she had breast lump, no lesion on exam Plan Mammogram ordered   STD testing  Diflucan single dose F/u pap test  2019 12/01/2019, 3:32 PM

## 2019-12-01 NOTE — Progress Notes (Signed)
Pt here for annual exam.  Pt states she was recently treated for ringworm from urgent care. Pt would like assessed today. Pt needs Pap today and request all testing to be done, including labs.  Pt states she also felt lump in her right breast about 1 week ago- non painful.

## 2019-12-02 LAB — HEPATITIS B SURFACE ANTIGEN: Hepatitis B Surface Ag: NEGATIVE

## 2019-12-02 LAB — RPR: RPR Ser Ql: NONREACTIVE

## 2019-12-02 LAB — HEPATITIS C ANTIBODY: Hep C Virus Ab: <0.1 s/co ratio (ref 0.0–0.9)

## 2019-12-02 LAB — HIV ANTIBODY (ROUTINE TESTING W REFLEX): HIV Screen 4th Generation wRfx: NONREACTIVE

## 2019-12-03 LAB — CERVICOVAGINAL ANCILLARY ONLY
Bacterial Vaginitis (gardnerella): NEGATIVE
Candida Glabrata: NEGATIVE
Candida Vaginitis: NEGATIVE
Chlamydia: NEGATIVE
Comment: NEGATIVE
Comment: NEGATIVE
Comment: NEGATIVE
Comment: NEGATIVE
Comment: NEGATIVE
Comment: NORMAL
Neisseria Gonorrhea: NEGATIVE
Trichomonas: NEGATIVE

## 2019-12-03 LAB — CYTOLOGY - PAP
Comment: NEGATIVE
Diagnosis: NEGATIVE
High risk HPV: NEGATIVE

## 2020-01-12 ENCOUNTER — Emergency Department (HOSPITAL_COMMUNITY)

## 2020-01-12 ENCOUNTER — Encounter (HOSPITAL_COMMUNITY): Payer: Self-pay

## 2020-01-12 ENCOUNTER — Other Ambulatory Visit: Payer: Self-pay

## 2020-01-12 ENCOUNTER — Emergency Department (HOSPITAL_COMMUNITY)
Admission: EM | Admit: 2020-01-12 | Discharge: 2020-01-12 | Disposition: A | Discharge status: Home / Self Care | Attending: Emergency Medicine | Admitting: Emergency Medicine

## 2020-01-12 DIAGNOSIS — Z20822 Contact with and (suspected) exposure to covid-19: Secondary | ICD-10-CM | POA: Insufficient documentation

## 2020-01-12 DIAGNOSIS — Z79899 Other long term (current) drug therapy: Secondary | ICD-10-CM | POA: Insufficient documentation

## 2020-01-12 DIAGNOSIS — Z91040 Latex allergy status: Secondary | ICD-10-CM | POA: Diagnosis not present

## 2020-01-12 DIAGNOSIS — R0602 Shortness of breath: Secondary | ICD-10-CM | POA: Diagnosis present

## 2020-01-12 DIAGNOSIS — R6883 Chills (without fever): Secondary | ICD-10-CM | POA: Insufficient documentation

## 2020-01-12 DIAGNOSIS — R05 Cough: Secondary | ICD-10-CM | POA: Insufficient documentation

## 2020-01-12 DIAGNOSIS — R079 Chest pain, unspecified: Secondary | ICD-10-CM | POA: Diagnosis not present

## 2020-01-12 DIAGNOSIS — J45909 Unspecified asthma, uncomplicated: Secondary | ICD-10-CM | POA: Insufficient documentation

## 2020-01-12 LAB — I-STAT BETA HCG BLOOD, ED (MC, WL, AP ONLY): I-stat hCG, quantitative: <5 m[IU]/mL (ref <5)

## 2020-01-12 LAB — CBC
HCT: 45.7 % (ref 36.0–46.0)
Hemoglobin: 15.4 g/dL — ABNORMAL HIGH (ref 12.0–15.0)
MCH: 33.1 pg (ref 26.0–34.0)
MCHC: 33.7 g/dL (ref 30.0–36.0)
MCV: 98.3 fL (ref 80.0–100.0)
Platelets: 185 10*3/uL (ref 150–400)
RBC: 4.65 MIL/uL (ref 3.87–5.11)
RDW: 11.9 % (ref 11.5–15.5)
WBC: 7.2 10*3/uL (ref 4.0–10.5)
nRBC: 0 % (ref 0.0–0.2)

## 2020-01-12 LAB — SARS CORONAVIRUS 2 (TAT 6-24 HRS): SARS Coronavirus 2: NEGATIVE

## 2020-01-12 LAB — INFLUENZA PANEL BY PCR (TYPE A & B)
Influenza A By PCR: NEGATIVE
Influenza B By PCR: NEGATIVE

## 2020-01-12 LAB — BASIC METABOLIC PANEL
Anion gap: 11 (ref 5–15)
BUN: 14 mg/dL (ref 6–20)
CO2: 23 mmol/L (ref 22–32)
Calcium: 9.4 mg/dL (ref 8.9–10.3)
Chloride: 105 mmol/L (ref 98–111)
Creatinine, Ser: 0.98 mg/dL (ref 0.44–1.00)
GFR calc Af Amer: >60 mL/min (ref >60)
GFR calc non Af Amer: >60 mL/min (ref >60)
Glucose, Bld: 89 mg/dL (ref 70–99)
Potassium: 3.7 mmol/L (ref 3.5–5.1)
Sodium: 139 mmol/L (ref 135–145)

## 2020-01-12 LAB — TROPONIN I (HIGH SENSITIVITY): Troponin I (High Sensitivity): <2 ng/L (ref <18)

## 2020-01-12 MED ORDER — NAPROXEN 500 MG PO TABS: 500.0000 mg | ORAL_TABLET | Freq: Two times a day (BID) | ORAL | 0 refills | Status: DC

## 2020-01-12 MED ORDER — BENZONATATE 100 MG PO CAPS
100.0000 mg | ORAL_CAPSULE | Freq: Three times a day (TID) | ORAL | 0 refills | Status: DC
Start: 2020-01-12 — End: 2021-02-21

## 2020-01-12 MED ORDER — KETOROLAC TROMETHAMINE 30 MG/ML IJ SOLN
30.0000 mg | Freq: Once | INTRAMUSCULAR | Status: AC
  Administered 2020-01-12: 30 mg via INTRAVENOUS
  Filled 2020-01-12: qty 1

## 2020-01-12 MED ORDER — ALBUTEROL SULFATE HFA 108 (90 BASE) MCG/ACT IN AERS: 1.0000 | INHALATION_SPRAY | Freq: Four times a day (QID) | RESPIRATORY_TRACT | 0 refills | Status: DC | PRN

## 2020-01-12 MED ORDER — ACETAMINOPHEN 325 MG PO TABS
650.0000 mg | ORAL_TABLET | Freq: Once | ORAL | Status: AC | PRN
  Administered 2020-01-12: 650 mg via ORAL
  Filled 2020-01-12: qty 2

## 2020-01-12 MED ORDER — ONDANSETRON 4 MG PO TBDP: 4.0000 mg | ORAL_TABLET | Freq: Three times a day (TID) | ORAL | 0 refills | Status: DC | PRN

## 2020-01-12 MED ORDER — ONDANSETRON HCL 4 MG/2ML IJ SOLN
4.0000 mg | Freq: Once | INTRAMUSCULAR | Status: AC
  Administered 2020-01-12: 4 mg via INTRAVENOUS
  Filled 2020-01-12: qty 2

## 2020-01-12 MED ORDER — SODIUM CHLORIDE 0.9% FLUSH
3.0000 mL | Freq: Once | INTRAVENOUS | Status: AC
  Administered 2020-01-12: 3 mL via INTRAVENOUS

## 2020-01-12 MED ORDER — SODIUM CHLORIDE 0.9 % IV BOLUS
1000.0000 mL | Freq: Once | INTRAVENOUS | Status: AC
  Administered 2020-01-12: 1000 mL via INTRAVENOUS

## 2020-01-12 NOTE — ED Provider Notes (Signed)
[facility]-EMERGENCY DEPT Provider Note   CSN: [id number] Arrival date & time: 01/12/20  1001     History Chief Complaint  Patient presents with  . Shortness of Breath  . Chest Pain  . Chills    Amy Reed is a 34 y.o. female with past medical history of asthma, GERD presenting to the ED with a chief complaint of shortness of breath, cough, chest pain and chills.  Symptoms began 2 days ago.  No sick contacts with similar symptoms, no known Covid exposures.  Reports minimal improvement noted with DayQuil and NyQuil.  Reports cough productive with mucus which she feels is secondary to her sinus congestion.  Reports nausea but denies any vomiting.  Reports right-sided chest pain without specific aggravating or alleviating factor.  Chest pain is intermittent.  She denies any leg swelling, pleuritic chest pain, history of DVT, PE, MI, recent immobilization, abdominal pain, urinary symptoms or possibility of pregnancy.  HPI     Past Medical History:  Diagnosis Date  . Allergy   . Asthma   . GERD (gastroesophageal reflux disease)   . History of multiple allergies     Patient Active Problem List   Diagnosis Date Noted  . Family hx of colon cancer requiring screening colonoscopy 09/11/2017  . Dysphagia 09/11/2017  . Globus sensation 09/11/2017  . Cough, persistent 08/07/2017  . Gastroesophageal reflux disease without esophagitis 08/07/2017    Past Surgical History:  Procedure Laterality Date  . 24 HOUR PH STUDY N/A 10/10/2017   Procedure: 24 HOUR PH STUDY-OFF of PPI;  Surgeon: Napoleon Form, MD;  Location: [facility];  Service: Endoscopy;  Laterality: N/A;  . DILATION AND CURETTAGE OF UTERUS    . ESOPHAGEAL MANOMETRY N/A 10/10/2017   Procedure: ESOPHAGEAL MANOMETRY (EM);  Surgeon: Napoleon Form, MD;  Location: [facility];  Service: Endoscopy;  Laterality: N/A;  . PH IMPEDANCE STUDY N/A 10/10/2017   Procedure: PH IMPEDANCE STUDY-OFF of PPI;   Surgeon: Napoleon Form, MD;  Location: [facility];  Service: Endoscopy;  Laterality: N/A;     OB History    Gravida  2   Para  1   Term      Preterm  1   AB  1   Living  1     SAB  1   TAB      Ectopic      Multiple      Live Births  1           Family History  Problem Relation Age of Onset  . Colon cancer Father   . Cervical polyp Sister   . Diabetes Maternal Grandmother   . Diabetes Maternal Grandfather   . Diabetes Paternal Grandmother   . Cervical cancer Paternal Grandmother   . Diabetes Paternal Grandfather   . Colon cancer Paternal Grandfather   . Colon cancer Paternal Aunt   . Colon cancer Paternal Aunt   . Esophageal cancer Neg Hx   . Stomach cancer Neg Hx   . Rectal cancer Neg Hx     Social History   Tobacco Use  . Smoking status: Never Smoker  . Smokeless tobacco: Never Used  Substance Use Topics  . Alcohol use: Yes    Comment: 2-3 times a month.   . Drug use: No    Home Medications Prior to Admission medications   Medication Sig Start Date End Date Taking? Authorizing Provider  Ascorbic Acid (VITAMIN C PO) Take 1 tablet  by mouth daily.   Yes [provider]  cetirizine (ZYRTEC) 10 MG tablet Take 10 mg by mouth at bedtime. 08/02/17  Yes [provider]  ELDERBERRY PO Take 1 tablet by mouth daily.   Yes [provider]  fluticasone (FLONASE) 50 MCG/ACT nasal spray Place 2 sprays into both nostrils daily.   Yes [provider]  VITAMIN D PO Take 1 capsule by mouth daily.   Yes [provider]  benzonatate (TESSALON) 100 MG capsule Take 1 capsule (100 mg total) by mouth every 8 (eight) hours. 01/12/20   Aryelle Figg, PA-C  clonazePAM (KLONOPIN) 0.5 MG tablet Take 1 tablet (0.5 mg total) by mouth 2 (two) times daily as needed for anxiety. Patient not taking: Reported on 12/01/2019 06/05/18   Forrest Moron, MD  cyclobenzaprine (FLEXERIL) 10 MG tablet Take 1 tablet (10 mg total) by mouth 3  (three) times daily as needed for muscle spasms. Patient not taking: Reported on 12/01/2019 05/16/19   Molpus, Jenny Reichmann, MD  HYDROcodone-acetaminophen (NORCO) 5-325 MG tablet Take 1 tablet by mouth every 6 (six) hours as needed (for pain). Patient not taking: Reported on 12/01/2019 05/16/19   Molpus, Jenny Reichmann, MD  naproxen (NAPROSYN) 500 MG tablet Take 1 tablet (500 mg total) by mouth 2 (two) times daily. 01/12/20   Edgardo Petrenko, PA-C  ondansetron (ZOFRAN ODT) 4 MG disintegrating tablet Take 1 tablet (4 mg total) by mouth every 8 (eight) hours as needed for nausea or vomiting. 01/12/20   Tavaris Eudy, PA-C    Allergies    Shellfish allergy and Latex  Review of Systems   Review of Systems  Constitutional: Positive for chills and fever. Negative for appetite change.  HENT: Positive for congestion and sinus pain. Negative for ear pain, rhinorrhea, sneezing and sore throat.   Eyes: Negative for photophobia and visual disturbance.  Respiratory: Positive for cough and shortness of breath. Negative for chest tightness and wheezing.   Cardiovascular: Positive for chest pain. Negative for palpitations.  Gastrointestinal: Positive for nausea. Negative for abdominal pain, blood in stool, constipation, diarrhea and vomiting.  Genitourinary: Negative for dysuria, hematuria and urgency.  Musculoskeletal: Negative for myalgias.  Skin: Negative for rash.  Neurological: Negative for dizziness, weakness and light-headedness.    Physical Exam Updated Vital Signs BP 125/83 (BP Location: Right Arm)   Pulse 93   Temp 98.8 F (37.1 C) (Oral)   Resp 15   Ht 5\' 6"  (1.676 m)   Wt 66.2 kg   LMP 12/23/2019 (Approximate)   SpO2 100%   BMI 23.57 kg/m   Physical Exam Vitals and nursing note reviewed.  Constitutional:      General: She is not in acute distress.    Appearance: She is well-developed.  HENT:     Head: Normocephalic and atraumatic.     Nose: Nose normal.  Eyes:     General: No scleral icterus.        Left eye: No discharge.     Conjunctiva/sclera: Conjunctivae normal.  Cardiovascular:     Rate and Rhythm: Normal rate and regular rhythm.     Heart sounds: Normal heart sounds. No murmur. No friction rub. No gallop.   Pulmonary:     Effort: Pulmonary effort is normal. No respiratory distress.     Breath sounds: Normal breath sounds.  Abdominal:     General: Bowel sounds are normal. There is no distension.     Palpations: Abdomen is soft.     Tenderness: There is  no abdominal tenderness. There is no guarding.  Musculoskeletal:        General: Normal range of motion.     Cervical back: Normal range of motion and neck supple.     Right lower leg: No tenderness. No edema.     Left lower leg: No tenderness. No edema.  Skin:    General: Skin is warm and dry.     Findings: No rash.  Neurological:     Mental Status: She is alert.     Motor: No abnormal muscle tone.     Coordination: Coordination normal.     ED Results / Procedures / Treatments   Labs (all labs ordered are listed, but only abnormal results are displayed) Labs Reviewed  CBC - Abnormal; Notable for the following components:      Result Value   Hemoglobin 15.4 (*)    All other components within normal limits  SARS CORONAVIRUS 2 (TAT 6-24 HRS)  BASIC METABOLIC PANEL  INFLUENZA PANEL BY PCR (TYPE A & B)  I-STAT BETA HCG BLOOD, ED ([facility], [facility], [facility] ONLY)  TROPONIN I (HIGH SENSITIVITY)    EKG None    Radiology DG Chest 2 View  Result Date: 01/12/2020 CLINICAL DATA:  Shortness of breath, chest pain. EXAM: CHEST - 2 VIEW COMPARISON:  April 03, 2018. FINDINGS: The heart size and mediastinal contours are within normal limits. Both lungs are clear. No pneumothorax or pleural effusion is noted. The visualized skeletal structures are unremarkable. IMPRESSION: No active cardiopulmonary disease. Electronically Signed   By: Lupita Raider M.D.   On: 01/12/2020 11:23    Procedures Procedures (including critical care time)   Medications Ordered in ED Medications  acetaminophen (TYLENOL) tablet 650 mg (650 mg Oral Given 01/12/20 1033)  sodium chloride flush (NS) 0.9 % injection 3 mL (3 mLs Intravenous Given 01/12/20 1324)  sodium chloride 0.9 % bolus 1,000 mL (1,000 mLs Intravenous Bolus from Bag 01/12/20 1323)  ondansetron (ZOFRAN) injection 4 mg (4 mg Intravenous Given 01/12/20 1324)  ketorolac (TORADOL) 30 MG/ML injection 30 mg (30 mg Intravenous Given 01/12/20 1332)    ED Course  I have reviewed the triage vital signs and the nursing notes.  Pertinent labs & imaging results that were available during my care of the patient were reviewed by me and considered in my medical decision making (see chart for details).    MDM Rules/Calculators/A&P                      ALBIRDA SHIEL was evaluated in Emergency Department on 01/12/2020 for the symptoms described in the history of present illness. She was evaluated in the context of the global COVID-19 pandemic, which necessitated consideration that the patient might be at risk for infection with the SARS-CoV-2 virus that causes COVID-19. Institutional protocols and algorithms that pertain to the evaluation of patients at risk for COVID-19 are in a state of rapid change based on information released by regulatory bodies including the CDC and federal and state organizations. These policies and algorithms were followed during the patient's care in the ED.  34 year old female with a past medical history of asthma, GERD presenting to the ED with a chief complaint of shortness of breath, cough, chest pain and chills.  Symptoms began 2 days ago.  Reports sinus congestion as well.  Reports nausea but denies vomiting.  Reports right-sided chest pain.  On exam patient is overall well-appearing.  Abdomen is soft, nontender nondistended.  Lungs  are clear to auscultation bilaterally, oxygen saturations above 99% on room air.  She is not tachycardic or tachypneic.  No lower extremity edema,  erythema or calf tenderness noted concerning for DVT.  CBC, BMP and troponin unremarkable.  EKG shows sinus tachycardia initially that improved with IV fluids and symptom control.  No ischemic changes noted.  Flu swab and Covid test are pending.  Patient given IV fluids here, Toradol with some improvement in her symptoms.  Suspect that symptoms are due to Covid versus other viral infection.  Doubt ACS, PE, pneumothorax or other emergent cause of her symptoms.  Chest x-ray without any concerning findings.  We will have her continue symptomatic treatment at home, follow-up with Covid and flu test when it is available.  Patient is agreeable to the plan.  All imaging, if done today, including plain films, CT scans, and ultrasounds, independently reviewed by me, and interpretations confirmed via formal radiology reads.  Patient is hemodynamically stable, in NAD, and able to ambulate in the ED. Evaluation does not show pathology that would require ongoing emergent intervention or inpatient treatment. I explained the diagnosis to the patient. Pain has been managed and has no complaints prior to discharge. Patient is comfortable with above plan and is stable for discharge at this time. All questions were answered prior to disposition. Strict return precautions for returning to the ED were discussed. Encouraged follow up with PCP.   An After Visit Summary was printed and given to the patient.   Portions of this note were generated with Scientist, clinical (histocompatibility and immunogenetics). Dictation errors may occur despite best attempts at proofreading.  Final Clinical Impression(s) / ED Diagnoses Final diagnoses:  Suspected COVID-19 virus infection    Rx / DC Orders ED Discharge Orders         Ordered    ondansetron (ZOFRAN ODT) 4 MG disintegrating tablet  Every 8 hours PRN     01/12/20 1501    benzonatate (TESSALON) 100 MG capsule  Every 8 hours     01/12/20 1501    naproxen (NAPROSYN) 500 MG tablet  2 times daily      01/12/20 1501           Dietrich Pates, PA-C 01/12/20 1505    Geoffery Lyons, MD 01/12/20 1622

## 2020-01-12 NOTE — ED Triage Notes (Signed)
Patient c/o SOB, chills, and right chest pain x 2 days.

## 2020-01-12 NOTE — ED Notes (Signed)
An After Visit Summary was printed and given to the patient. Discharge instructions given and no further questions at this time.  

## 2020-01-12 NOTE — Discharge Instructions (Signed)
Take medications to help with your symptoms. We will contact you with the results of your Covid test and flu test when it is available. Return to the ED if you start to experience worsening shortness of breath, vomiting, chest pain, leg swelling.

## 2020-10-07 ENCOUNTER — Emergency Department (HOSPITAL_BASED_OUTPATIENT_CLINIC_OR_DEPARTMENT_OTHER)

## 2020-10-07 ENCOUNTER — Encounter (HOSPITAL_BASED_OUTPATIENT_CLINIC_OR_DEPARTMENT_OTHER): Payer: Self-pay | Admitting: Emergency Medicine

## 2020-10-07 ENCOUNTER — Other Ambulatory Visit: Payer: Self-pay

## 2020-10-07 DIAGNOSIS — R072 Precordial pain: Secondary | ICD-10-CM | POA: Diagnosis present

## 2020-10-07 DIAGNOSIS — Z7952 Long term (current) use of systemic steroids: Secondary | ICD-10-CM | POA: Diagnosis not present

## 2020-10-07 DIAGNOSIS — Z20822 Contact with and (suspected) exposure to covid-19: Secondary | ICD-10-CM | POA: Diagnosis not present

## 2020-10-07 DIAGNOSIS — J45909 Unspecified asthma, uncomplicated: Secondary | ICD-10-CM | POA: Insufficient documentation

## 2020-10-07 NOTE — ED Triage Notes (Addendum)
Intermittent chest pain since last night. Reports shortness of breath, congestion. Denies fevers. States she has to get on a plane in three hours, prompting her visit to ED.

## 2020-10-08 ENCOUNTER — Emergency Department (HOSPITAL_BASED_OUTPATIENT_CLINIC_OR_DEPARTMENT_OTHER)
Admission: EM | Admit: 2020-10-08 | Discharge: 2020-10-08 | Discharge status: Against Medical Advice | Attending: Emergency Medicine | Admitting: Emergency Medicine

## 2020-10-08 ENCOUNTER — Encounter (HOSPITAL_BASED_OUTPATIENT_CLINIC_OR_DEPARTMENT_OTHER): Payer: Self-pay | Admitting: Emergency Medicine

## 2020-10-08 DIAGNOSIS — Z20822 Contact with and (suspected) exposure to covid-19: Secondary | ICD-10-CM

## 2020-10-08 LAB — BASIC METABOLIC PANEL
Anion gap: 9 (ref 5–15)
BUN: 14 mg/dL (ref 6–20)
CO2: 26 mmol/L (ref 22–32)
Calcium: 9.2 mg/dL (ref 8.9–10.3)
Chloride: 101 mmol/L (ref 98–111)
Creatinine, Ser: 0.87 mg/dL (ref 0.44–1.00)
GFR, Estimated: >60 mL/min (ref >60)
Glucose, Bld: 109 mg/dL — ABNORMAL HIGH (ref 70–99)
Potassium: 3.4 mmol/L — ABNORMAL LOW (ref 3.5–5.1)
Sodium: 136 mmol/L (ref 135–145)

## 2020-10-08 LAB — CBC
HCT: 40.6 % (ref 36.0–46.0)
Hemoglobin: 13.9 g/dL (ref 12.0–15.0)
MCH: 33.3 pg (ref 26.0–34.0)
MCHC: 34.2 g/dL (ref 30.0–36.0)
MCV: 97.4 fL (ref 80.0–100.0)
Platelets: 252 10*3/uL (ref 150–400)
RBC: 4.17 MIL/uL (ref 3.87–5.11)
RDW: 11.9 % (ref 11.5–15.5)
WBC: 6.5 10*3/uL (ref 4.0–10.5)
nRBC: 0 % (ref 0.0–0.2)

## 2020-10-08 LAB — TROPONIN I (HIGH SENSITIVITY): Troponin I (High Sensitivity): 2 ng/L (ref <18)

## 2020-10-08 NOTE — ED Provider Notes (Signed)
[facility] EMERGENCY DEPARTMENT Provider Note   CSN: [id number] Arrival date & time: 10/07/20  2345     History Chief Complaint  Patient presents with  . Chest Pain    Amy Reed is a 35 y.o. female.  The history is provided by the patient.  Chest Pain Pain location:  Substernal area Pain quality: dull   Pain radiates to:  Does not radiate Pain severity:  Moderate Onset quality:  Gradual Duration:  1 day Timing:  Constant Progression:  Waxing and waning Chronicity:  New Context: at rest   Relieved by:  Nothing Worsened by:  Nothing Ineffective treatments:  None tried Associated symptoms: cough   Associated symptoms: no abdominal pain, no dizziness, no fever, no palpitations and no shortness of breath   Associated symptoms comment:  Congestion Risk factors: no birth control, no hypertension, not pregnant and no prior DVT/PE   Patient presents with 36 hours of cough, congestion and chest pain.  States she needs clearance for flight in 2 hours.  No f/c/r.       Past Medical History:  Diagnosis Date  . Allergy   . Asthma   . GERD (gastroesophageal reflux disease)   . History of multiple allergies     Patient Active Problem List   Diagnosis Date Noted  . Family hx of colon cancer requiring screening colonoscopy 09/11/2017  . Dysphagia 09/11/2017  . Globus sensation 09/11/2017  . Cough, persistent 08/07/2017  . Gastroesophageal reflux disease without esophagitis 08/07/2017    Past Surgical History:  Procedure Laterality Date  . 24 HOUR PH STUDY N/A 10/10/2017   Procedure: 24 HOUR PH STUDY-OFF of PPI;  Surgeon: Napoleon Form, MD;  Location: [facility];  Service: Endoscopy;  Laterality: N/A;  . DILATION AND CURETTAGE OF UTERUS    . ESOPHAGEAL MANOMETRY N/A 10/10/2017   Procedure: ESOPHAGEAL MANOMETRY (EM);  Surgeon: Napoleon Form, MD;  Location: [facility];  Service: Endoscopy;  Laterality: N/A;  . PH IMPEDANCE STUDY N/A 10/10/2017    Procedure: PH IMPEDANCE STUDY-OFF of PPI;  Surgeon: Napoleon Form, MD;  Location: [facility];  Service: Endoscopy;  Laterality: N/A;     OB History    Gravida  2   Para  1   Term      Preterm  1   AB  1   Living  1     SAB  1   IAB      Ectopic      Multiple      Live Births  1           Family History  Problem Relation Age of Onset  . Colon cancer Father   . Cervical polyp Sister   . Diabetes Maternal Grandmother   . Diabetes Maternal Grandfather   . Diabetes Paternal Grandmother   . Cervical cancer Paternal Grandmother   . Diabetes Paternal Grandfather   . Colon cancer Paternal Grandfather   . Colon cancer Paternal Aunt   . Colon cancer Paternal Aunt   . Esophageal cancer Neg Hx   . Stomach cancer Neg Hx   . Rectal cancer Neg Hx     Social History   Tobacco Use  . Smoking status: Never Smoker  . Smokeless tobacco: Never Used  Vaping Use  . Vaping Use: Never used  Substance Use Topics  . Alcohol use: Yes    Comment: 2-3 times a month.   . Drug use: No    Home  Medications Prior to Admission medications   Medication Sig Start Date End Date Taking? Authorizing Provider  albuterol (VENTOLIN HFA) 108 (90 Base) MCG/ACT inhaler Inhale 1-2 puffs into the lungs every 6 (six) hours as needed for wheezing or shortness of breath. 01/12/20   Khatri, Hina, PA-C  Ascorbic Acid (VITAMIN C PO) Take 1 tablet by mouth daily.    [provider]  benzonatate (TESSALON) 100 MG capsule Take 1 capsule (100 mg total) by mouth every 8 (eight) hours. 01/12/20   Khatri, Hina, PA-C  cetirizine (ZYRTEC) 10 MG tablet Take 10 mg by mouth at bedtime. 08/02/17   [provider]  clonazePAM (KLONOPIN) 0.5 MG tablet Take 1 tablet (0.5 mg total) by mouth 2 (two) times daily as needed for anxiety. Patient not taking: No sig reported 06/05/18   Doristine Bosworth, MD  cyclobenzaprine (FLEXERIL) 10 MG tablet Take 1 tablet (10 mg total) by mouth 3 (three) times  daily as needed for muscle spasms. Patient not taking: No sig reported 05/16/19   Molpus, John, MD  ELDERBERRY PO Take 1 tablet by mouth daily.    [provider]  fluticasone (FLONASE) 50 MCG/ACT nasal spray Place 2 sprays into both nostrils daily.    [provider]  HYDROcodone-acetaminophen (NORCO) 5-325 MG tablet Take 1 tablet by mouth every 6 (six) hours as needed (for pain). Patient not taking: Reported on 12/01/2019 05/16/19   Molpus, Jonny Ruiz, MD  naproxen (NAPROSYN) 500 MG tablet Take 1 tablet (500 mg total) by mouth 2 (two) times daily. 01/12/20   Khatri, Hina, PA-C  ondansetron (ZOFRAN ODT) 4 MG disintegrating tablet Take 1 tablet (4 mg total) by mouth every 8 (eight) hours as needed for nausea or vomiting. 01/12/20   Khatri, Hina, PA-C  VITAMIN D PO Take 1 capsule by mouth daily.    [provider]    Allergies    Shellfish allergy and Latex  Review of Systems   Review of Systems  Constitutional: Negative for fever.  HENT: Positive for congestion.   Eyes: Negative for photophobia.  Respiratory: Positive for cough. Negative for shortness of breath.   Cardiovascular: Positive for chest pain. Negative for palpitations and leg swelling.  Gastrointestinal: Negative for abdominal pain.  Endocrine: Negative for polyuria.  Genitourinary: Negative for difficulty urinating.  Musculoskeletal: Negative for arthralgias.  Skin: Negative for rash.  Neurological: Negative for dizziness.  Psychiatric/Behavioral: Negative for agitation.  All other systems reviewed and are negative.   Physical Exam Updated Vital Signs BP 121/79   Pulse 86   Temp 98.3 F (36.8 C) (Oral)   Resp 17   LMP 10/06/2020 (Exact Date)   SpO2 99%   Physical Exam  ED Results / Procedures / Treatments   Labs (all labs ordered are listed, but only abnormal results are displayed) Results for orders placed or performed during the hospital encounter of 10/08/20  Basic metabolic panel   Result Value Ref Range   Sodium 136 135 - 145 mmol/L   Potassium 3.4 (L) 3.5 - 5.1 mmol/L   Chloride 101 98 - 111 mmol/L   CO2 26 22 - 32 mmol/L   Glucose, Bld 109 (H) 70 - 99 mg/dL   BUN 14 6 - 20 mg/dL   Creatinine, Ser 3.15 0.44 - 1.00 mg/dL   Calcium 9.2 8.9 - 17.6 mg/dL   GFR, Estimated >16 >07 mL/min   Anion gap 9 5 - 15  CBC  Result Value Ref Range   WBC 6.5 4.0 -  10.5 K/uL   RBC 4.17 3.87 - 5.11 MIL/uL   Hemoglobin 13.9 12.0 - 15.0 g/dL   HCT 82.4 23.5 - 36.1 %   MCV 97.4 80.0 - 100.0 fL   MCH 33.3 26.0 - 34.0 pg   MCHC 34.2 30.0 - 36.0 g/dL   RDW 44.3 15.4 - 00.8 %   Platelets 252 150 - 400 K/uL   nRBC 0.0 0.0 - 0.2 %  Troponin I (High Sensitivity)  Result Value Ref Range   Troponin I (High Sensitivity) 2 <18 ng/L   DG Chest 2 View  Result Date: 10/08/2020 CLINICAL DATA:  Chest pain EXAM: CHEST - 2 VIEW COMPARISON:  None. FINDINGS: The heart size and mediastinal contours are within normal limits. Both lungs are clear. The visualized skeletal structures are unremarkable. IMPRESSION: No active cardiopulmonary disease. Electronically Signed   By: Jonna Clark M.D.   On: 10/08/2020 00:10    EKG EKG Interpretation  Date/Time:  Thursday October 07 2020 23:45:18 EST Ventricular Rate:  85 PR Interval:  142 QRS Duration: 88 QT Interval:  336 QTC Calculation: 399 R Axis:   77 Text Interpretation: Normal sinus rhythm Confirmed by Nicanor Alcon, Madaleine Simmon ([id number]) on 10/08/2020 2:16:59 AM   Radiology DG Chest 2 View  Result Date: 10/08/2020 CLINICAL DATA:  Chest pain EXAM: CHEST - 2 VIEW COMPARISON:  None. FINDINGS: The heart size and mediastinal contours are within normal limits. Both lungs are clear. The visualized skeletal structures are unremarkable. IMPRESSION: No active cardiopulmonary disease. Electronically Signed   By: Jonna Clark M.D.   On: 10/08/2020 00:10    Procedures Procedures   Medications Ordered in ED Medications - No data to display  ED Course  I have  reviewed the triage vital signs and the nursing notes.  Pertinent labs & imaging results that were available during my care of the patient were reviewed by me and considered in my medical decision making (see chart for details).   Patient ruled out for MI in the ED.  Heart score is 1.  PERC negative wells 0, highly doubt PE in this low risk patient.    I explained I could not clear the patient to fly as she had symptoms concerning for covid.  I ordered a covid swab.  I states she could not fly and I would give a note.    Patient then eloped  Amy Reed was evaluated in Emergency Department on 10/08/2020 for the symptoms described in the history of present illness. She was evaluated in the context of the global COVID-19 pandemic, which necessitated consideration that the patient might be at risk for infection with the SARS-CoV-2 virus that causes COVID-19. Institutional protocols and algorithms that pertain to the evaluation of patients at risk for COVID-19 are in a state of rapid change based on information released by regulatory bodies including the CDC and federal and state organizations. These policies and algorithms were followed during the patient's care in the ED.  Final Clinical Impression(s) / ED Diagnoses Final diagnoses:  Person under investigation for COVID-19    Rx / DC Orders ED Discharge Orders    None       Airrion Otting, MD 10/08/20 [id number]

## 2020-10-26 ENCOUNTER — Ambulatory Visit: Admitting: Obstetrics & Gynecology

## 2020-11-02 ENCOUNTER — Other Ambulatory Visit: Payer: Self-pay

## 2020-11-02 ENCOUNTER — Other Ambulatory Visit (HOSPITAL_COMMUNITY)
Admission: RE | Admit: 2020-11-02 | Discharge: 2020-11-02 | Disposition: A | Discharge status: Home / Self Care | Source: Ambulatory Visit | Attending: Obstetrics & Gynecology | Admitting: Obstetrics & Gynecology

## 2020-11-02 ENCOUNTER — Ambulatory Visit (INDEPENDENT_AMBULATORY_CARE_PROVIDER_SITE_OTHER): Admitting: Obstetrics & Gynecology

## 2020-11-02 ENCOUNTER — Encounter: Payer: Self-pay | Admitting: Obstetrics & Gynecology

## 2020-11-02 VITALS — BP 120/79 | HR 103 | Ht 67.0 in | Wt 153.0 lb

## 2020-11-02 DIAGNOSIS — N898 Other specified noninflammatory disorders of vagina: Secondary | ICD-10-CM | POA: Diagnosis present

## 2020-11-02 NOTE — Progress Notes (Signed)
GYN presents recurrent BV/yeast after cycles. C/o itching.

## 2020-11-02 NOTE — Progress Notes (Signed)
Patient ID: Amy Reed, female   DOB: [birth year], 35 y.o.   MRN: [id number]  Chief Complaint  Patient presents with  . Vaginal Discharge    HPI Amy Reed is a 35 y.o. female.  [id number] Patient's last menstrual period was 10/25/2020. For several months she has used OTC medication to treat sx of vaginal discharge and itching c/w vaginal yeast infection. She has sx now, not on any treatment.   HPI  Past Medical History:  Diagnosis Date  . Allergy   . Asthma   . GERD (gastroesophageal reflux disease)   . History of multiple allergies     Past Surgical History:  Procedure Laterality Date  . 24 HOUR PH STUDY N/A 10/10/2017   Procedure: 24 HOUR PH STUDY-OFF of PPI;  Surgeon: Napoleon Form, MD;  Location: [facility];  Service: Endoscopy;  Laterality: N/A;  . DILATION AND CURETTAGE OF UTERUS    . ESOPHAGEAL MANOMETRY N/A 10/10/2017   Procedure: ESOPHAGEAL MANOMETRY (EM);  Surgeon: Napoleon Form, MD;  Location: [facility];  Service: Endoscopy;  Laterality: N/A;  . PH IMPEDANCE STUDY N/A 10/10/2017   Procedure: PH IMPEDANCE STUDY-OFF of PPI;  Surgeon: Napoleon Form, MD;  Location: [facility];  Service: Endoscopy;  Laterality: N/A;    Family History  Problem Relation Age of Onset  . Colon cancer Father   . Cervical polyp Sister   . Diabetes Maternal Grandmother   . Diabetes Maternal Grandfather   . Diabetes Paternal Grandmother   . Cervical cancer Paternal Grandmother   . Diabetes Paternal Grandfather   . Colon cancer Paternal Grandfather   . Colon cancer Paternal Aunt   . Colon cancer Paternal Aunt   . Esophageal cancer Neg Hx   . Stomach cancer Neg Hx   . Rectal cancer Neg Hx     Social History Social History   Tobacco Use  . Smoking status: Never Smoker  . Smokeless tobacco: Never Used  Vaping Use  . Vaping Use: Never used  Substance Use Topics  . Alcohol use: Yes    Comment: 2-3 times a month.   . Drug use: No    Allergies   Allergen Reactions  . Shellfish Allergy Anaphylaxis  . Latex Itching    Current Outpatient Medications  Medication Sig Dispense Refill  . albuterol (VENTOLIN HFA) 108 (90 Base) MCG/ACT inhaler Inhale 1-2 puffs into the lungs every 6 (six) hours as needed for wheezing or shortness of breath. 18 g 0  . Ascorbic Acid (VITAMIN C PO) Take 1 tablet by mouth daily.    . benzonatate (TESSALON) 100 MG capsule Take 1 capsule (100 mg total) by mouth every 8 (eight) hours. 21 capsule 0  . cetirizine (ZYRTEC) 10 MG tablet Take 10 mg by mouth at bedtime.  0  . clonazePAM (KLONOPIN) 0.5 MG tablet Take 1 tablet (0.5 mg total) by mouth 2 (two) times daily as needed for anxiety. (Patient not taking: No sig reported) 20 tablet 1  . cyclobenzaprine (FLEXERIL) 10 MG tablet Take 1 tablet (10 mg total) by mouth 3 (three) times daily as needed for muscle spasms. (Patient not taking: No sig reported) 20 tablet 0  . ELDERBERRY PO Take 1 tablet by mouth daily.    . fluticasone (FLONASE) 50 MCG/ACT nasal spray Place 2 sprays into both nostrils daily.    Marland Kitchen HYDROcodone-acetaminophen (NORCO) 5-325 MG tablet Take 1 tablet by mouth every 6 (six) hours as needed (for pain). (Patient not taking: Reported on  12/01/2019) 20 tablet 0  . naproxen (NAPROSYN) 500 MG tablet Take 1 tablet (500 mg total) by mouth 2 (two) times daily. (Patient not taking: Reported on 11/02/2020) 30 tablet 0  . ondansetron (ZOFRAN ODT) 4 MG disintegrating tablet Take 1 tablet (4 mg total) by mouth every 8 (eight) hours as needed for nausea or vomiting. (Patient not taking: Reported on 11/02/2020) 4 tablet 0  . VITAMIN D PO Take 1 capsule by mouth daily.     Current Facility-Administered Medications  Medication Dose Route Frequency Provider Last Rate Last Admin  . 0.9 %  sodium chloride infusion  500 mL Intravenous Once Armbruster, Willaim Rayas, MD        Review of Systems Review of Systems  Constitutional: Negative.   Gastrointestinal: Negative.    Genitourinary: Positive for vaginal discharge. Negative for dysuria, menstrual problem, pelvic pain and vaginal bleeding.    Blood pressure 120/79, pulse (!) 103, height 5\' 7"  (1.702 m), weight 153 lb (69.4 kg), last menstrual period 10/25/2020.  Physical Exam Physical Exam Constitutional:      Appearance: Normal appearance.  Pulmonary:     Effort: Pulmonary effort is normal.  Genitourinary:    General: Normal vulva.     Vagina: Vaginal discharge (white, possible yeast) present.  Neurological:     General: No focal deficit present.     Mental Status: She is alert.  Psychiatric:        Mood and Affect: Mood normal.        Behavior: Behavior normal.     Data Reviewed   Assessment Vaginal discharge - Plan: Cervicovaginal ancillary only( [facility])    Plan F/u on vaginal probe, consider extended treatment for yeast vaginitis if identified    10/27/2020 11/02/2020, 4:56 PM

## 2020-11-04 ENCOUNTER — Other Ambulatory Visit: Payer: Self-pay | Admitting: Obstetrics & Gynecology

## 2020-11-04 DIAGNOSIS — N898 Other specified noninflammatory disorders of vagina: Secondary | ICD-10-CM

## 2020-11-04 LAB — CERVICOVAGINAL ANCILLARY ONLY
Bacterial Vaginitis (gardnerella): NEGATIVE
Candida Glabrata: NEGATIVE
Candida Vaginitis: POSITIVE — AB
Chlamydia: NEGATIVE
Comment: NEGATIVE
Comment: NEGATIVE
Comment: NEGATIVE
Comment: NEGATIVE
Comment: NEGATIVE
Comment: NORMAL
Neisseria Gonorrhea: NEGATIVE
Trichomonas: NEGATIVE

## 2020-11-04 MED ORDER — FLUCONAZOLE 150 MG PO TABS: 150.0000 mg | ORAL_TABLET | ORAL | 0 refills | Status: DC

## 2020-11-04 NOTE — Progress Notes (Signed)
Meds ordered this encounter  Medications  . fluconazole (DIFLUCAN) 150 MG tablet    Sig: Take 1 tablet (150 mg total) by mouth once a week. For 2 doses    Dispense:  2 tablet    Refill:  0

## 2020-11-22 ENCOUNTER — Other Ambulatory Visit: Payer: Self-pay

## 2020-11-22 DIAGNOSIS — N898 Other specified noninflammatory disorders of vagina: Secondary | ICD-10-CM

## 2020-11-22 MED ORDER — FLUCONAZOLE 150 MG PO TABS: 150.0000 mg | ORAL_TABLET | Freq: Once | ORAL | 0 refills | Status: AC

## 2020-11-22 NOTE — Progress Notes (Signed)
+  Yeast swab on 11/02/20. Pt is symptomatic.   Pt misplaced the Diflucan we sent a few weeks ago.

## 2020-12-09 ENCOUNTER — Emergency Department (HOSPITAL_BASED_OUTPATIENT_CLINIC_OR_DEPARTMENT_OTHER)
Admission: EM | Admit: 2020-12-09 | Discharge: 2020-12-09 | Disposition: A | Discharge status: Home / Self Care | Attending: Emergency Medicine | Admitting: Emergency Medicine

## 2020-12-09 ENCOUNTER — Other Ambulatory Visit: Payer: Self-pay

## 2020-12-09 ENCOUNTER — Encounter (HOSPITAL_BASED_OUTPATIENT_CLINIC_OR_DEPARTMENT_OTHER): Payer: Self-pay | Admitting: *Deleted

## 2020-12-09 DIAGNOSIS — Z91040 Latex allergy status: Secondary | ICD-10-CM | POA: Insufficient documentation

## 2020-12-09 DIAGNOSIS — Z20822 Contact with and (suspected) exposure to covid-19: Secondary | ICD-10-CM | POA: Diagnosis not present

## 2020-12-09 DIAGNOSIS — R000 Tachycardia, unspecified: Secondary | ICD-10-CM | POA: Diagnosis not present

## 2020-12-09 DIAGNOSIS — N3000 Acute cystitis without hematuria: Secondary | ICD-10-CM | POA: Diagnosis not present

## 2020-12-09 DIAGNOSIS — Z7951 Long term (current) use of inhaled steroids: Secondary | ICD-10-CM | POA: Insufficient documentation

## 2020-12-09 DIAGNOSIS — Z28310 Unvaccinated for covid-19: Secondary | ICD-10-CM | POA: Diagnosis not present

## 2020-12-09 DIAGNOSIS — J45909 Unspecified asthma, uncomplicated: Secondary | ICD-10-CM | POA: Diagnosis not present

## 2020-12-09 DIAGNOSIS — R509 Fever, unspecified: Secondary | ICD-10-CM | POA: Diagnosis present

## 2020-12-09 DIAGNOSIS — B349 Viral infection, unspecified: Secondary | ICD-10-CM

## 2020-12-09 LAB — LIPASE, BLOOD: Lipase: 31 U/L (ref 11–51)

## 2020-12-09 LAB — URINALYSIS, MICROSCOPIC (REFLEX)

## 2020-12-09 LAB — COMPREHENSIVE METABOLIC PANEL
ALT: 15 U/L (ref 0–44)
AST: 19 U/L (ref 15–41)
Albumin: 4.1 g/dL (ref 3.5–5.0)
Alkaline Phosphatase: 50 U/L (ref 38–126)
Anion gap: 9 (ref 5–15)
BUN: 13 mg/dL (ref 6–20)
CO2: 20 mmol/L — ABNORMAL LOW (ref 22–32)
Calcium: 9.1 mg/dL (ref 8.9–10.3)
Chloride: 102 mmol/L (ref 98–111)
Creatinine, Ser: 0.77 mg/dL (ref 0.44–1.00)
GFR, Estimated: >60 mL/min (ref >60)
Glucose, Bld: 96 mg/dL (ref 70–99)
Potassium: 3.3 mmol/L — ABNORMAL LOW (ref 3.5–5.1)
Sodium: 131 mmol/L — ABNORMAL LOW (ref 135–145)
Total Bilirubin: 0.7 mg/dL (ref 0.3–1.2)
Total Protein: 7.5 g/dL (ref 6.5–8.1)

## 2020-12-09 LAB — URINALYSIS, ROUTINE W REFLEX MICROSCOPIC
Bilirubin Urine: NEGATIVE
Glucose, UA: NEGATIVE mg/dL
Hgb urine dipstick: NEGATIVE
Ketones, ur: 40 mg/dL — AB
Nitrite: NEGATIVE
Protein, ur: NEGATIVE mg/dL
Specific Gravity, Urine: 1.025 (ref 1.005–1.030)
pH: 5.5 (ref 5.0–8.0)

## 2020-12-09 LAB — CBC WITH DIFFERENTIAL/PLATELET
Abs Immature Granulocytes: 0.02 10*3/uL (ref 0.00–0.07)
Basophils Absolute: 0 10*3/uL (ref 0.0–0.1)
Basophils Relative: 0 %
Eosinophils Absolute: 0 10*3/uL (ref 0.0–0.5)
Eosinophils Relative: 0 %
HCT: 42.7 % (ref 36.0–46.0)
Hemoglobin: 14.6 g/dL (ref 12.0–15.0)
Immature Granulocytes: 0 %
Lymphocytes Relative: 9 %
Lymphs Abs: 0.6 10*3/uL — ABNORMAL LOW (ref 0.7–4.0)
MCH: 33 pg (ref 26.0–34.0)
MCHC: 34.2 g/dL (ref 30.0–36.0)
MCV: 96.6 fL (ref 80.0–100.0)
Monocytes Absolute: 0.3 10*3/uL (ref 0.1–1.0)
Monocytes Relative: 5 %
Neutro Abs: 5.5 10*3/uL (ref 1.7–7.7)
Neutrophils Relative %: 86 %
Platelets: 201 10*3/uL (ref 150–400)
RBC: 4.42 MIL/uL (ref 3.87–5.11)
RDW: 11.9 % (ref 11.5–15.5)
WBC: 6.4 10*3/uL (ref 4.0–10.5)
nRBC: 0 % (ref 0.0–0.2)

## 2020-12-09 LAB — PREGNANCY, URINE: Preg Test, Ur: NEGATIVE

## 2020-12-09 MED ORDER — ONDANSETRON HCL 4 MG PO TABS: 4.0000 mg | ORAL_TABLET | Freq: Four times a day (QID) | ORAL | 0 refills | Status: DC

## 2020-12-09 MED ORDER — FLUCONAZOLE 200 MG PO TABS: 200.0000 mg | ORAL_TABLET | Freq: Once | ORAL | 0 refills | Status: AC

## 2020-12-09 MED ORDER — NITROFURANTOIN MONOHYD MACRO 100 MG PO CAPS: 100.0000 mg | ORAL_CAPSULE | Freq: Two times a day (BID) | ORAL | 0 refills | Status: AC

## 2020-12-09 MED ORDER — ONDANSETRON 4 MG PO TBDP
4.0000 mg | ORAL_TABLET | Freq: Once | ORAL | Status: AC
  Administered 2020-12-09: 4 mg via ORAL
  Filled 2020-12-09: qty 1

## 2020-12-09 MED ORDER — FLUCONAZOLE 200 MG PO TABS
200.0000 mg | ORAL_TABLET | Freq: Once | ORAL | Status: DC
  Filled 2020-12-09: qty 1

## 2020-12-09 MED ORDER — ACETAMINOPHEN 325 MG PO TABS
650.0000 mg | ORAL_TABLET | Freq: Once | ORAL | Status: AC
  Administered 2020-12-09: 650 mg via ORAL
  Filled 2020-12-09: qty 2

## 2020-12-09 MED ORDER — SODIUM CHLORIDE 0.9 % IV BOLUS
1000.0000 mL | Freq: Once | INTRAVENOUS | Status: AC
  Administered 2020-12-09: 1000 mL via INTRAVENOUS

## 2020-12-09 MED ORDER — SODIUM CHLORIDE 0.9 % IV SOLN
1.0000 g | Freq: Once | INTRAVENOUS | Status: AC
  Administered 2020-12-09: 1 g via INTRAVENOUS
  Filled 2020-12-09: qty 10

## 2020-12-09 NOTE — ED Provider Notes (Signed)
MSE was initiated and I personally evaluated the patient and placed orders (if any) at  3:27 PM on December 09, 2020.  The patient appears stable so that the remainder of the MSE may be completed by another provider.   Patient placed in Quick Look pathway, seen and evaluated   Chief Complaint: Fever, nausea/vomiting/diarrhea, abdominal pain, generalized body aches. Marland Kitchen  HPI:   35 y.o. F who presents for evaluation of 2 days of nausea/vomiting/diarrhea, fevers, body aches, abdominal pain.  She reports she has not been able tolerate any p.o. in the last 2 days.  She denies any blood in the vomit.  No bloody stools.  She has not been around any sick contacts.  She did not get vaccinated for COVID.  ROS: nausea/vomiting/diarrhea, fever, abd pain  Physical Exam:   Gen: No distress  Neuro: Awake and Alert  Skin: Warm    Focused Exam: Abdomen soft, nondistended.  Generalized tenderness noted diffusely with no focal point.  Regular rate and rhythm.   Initiation of care has begun. The patient has been counseled on the process, plan, and necessity for staying for the completion/evaluation, and the remainder of the medical screening examination    Rosana Hoes 12/09/20 1529    Koleen Distance, MD 12/09/20 [phone]

## 2020-12-09 NOTE — ED Notes (Signed)
Checked on pt - phone that was on charger returned to pt  Pt comfortable

## 2020-12-09 NOTE — ED Provider Notes (Signed)
[facility] EMERGENCY DEPARTMENT Provider Note   CSN: [id number] Arrival date & time: 12/09/20  1420     History Chief Complaint  Patient presents with  . Fever    Amy Reed is a 35 y.o. female.  The history is provided by the patient.  Emesis Severity:  Moderate Duration:  2 days Timing:  Intermittent Progression:  Unchanged Chronicity:  New Relieved by:  Nothing Worsened by:  Liquids Ineffective treatments:  None tried Associated symptoms: arthralgias, fever and myalgias   Associated symptoms: no abdominal pain, no chills, no cough, no sore throat and no URI   Risk factors: no sick contacts and no suspect food intake   Risk factors comment:  Unvaccinated for COVID-19      Past Medical History:  Diagnosis Date  . Allergy   . Asthma   . GERD (gastroesophageal reflux disease)   . History of multiple allergies     Patient Active Problem List   Diagnosis Date Noted  . Family hx of colon cancer requiring screening colonoscopy 09/11/2017  . Globus sensation 09/11/2017  . Gastroesophageal reflux disease without esophagitis 08/07/2017    Past Surgical History:  Procedure Laterality Date  . 24 HOUR PH STUDY N/A 10/10/2017   Procedure: 24 HOUR PH STUDY-OFF of PPI;  Surgeon: Napoleon Form, MD;  Location: [facility];  Service: Endoscopy;  Laterality: N/A;  . DILATION AND CURETTAGE OF UTERUS    . ESOPHAGEAL MANOMETRY N/A 10/10/2017   Procedure: ESOPHAGEAL MANOMETRY (EM);  Surgeon: Napoleon Form, MD;  Location: [facility];  Service: Endoscopy;  Laterality: N/A;  . PH IMPEDANCE STUDY N/A 10/10/2017   Procedure: PH IMPEDANCE STUDY-OFF of PPI;  Surgeon: Napoleon Form, MD;  Location: [facility];  Service: Endoscopy;  Laterality: N/A;     OB History    Gravida  2   Para  1   Term      Preterm  1   AB  1   Living  1     SAB  1   IAB      Ectopic      Multiple      Live Births  1           Family History  Problem  Relation Age of Onset  . Colon cancer Father   . Cervical polyp Sister   . Diabetes Maternal Grandmother   . Diabetes Maternal Grandfather   . Diabetes Paternal Grandmother   . Cervical cancer Paternal Grandmother   . Diabetes Paternal Grandfather   . Colon cancer Paternal Grandfather   . Colon cancer Paternal Aunt   . Colon cancer Paternal Aunt   . Esophageal cancer Neg Hx   . Stomach cancer Neg Hx   . Rectal cancer Neg Hx     Social History   Tobacco Use  . Smoking status: Never Smoker  . Smokeless tobacco: Never Used  Vaping Use  . Vaping Use: Never used  Substance Use Topics  . Alcohol use: Yes    Comment: 2-3 times a month.   . Drug use: No    Home Medications Prior to Admission medications   Medication Sig Start Date End Date Taking? Authorizing Provider  albuterol (VENTOLIN HFA) 108 (90 Base) MCG/ACT inhaler Inhale 1-2 puffs into the lungs every 6 (six) hours as needed for wheezing or shortness of breath. 01/12/20   Khatri, Hina, PA-C  Ascorbic Acid (VITAMIN C PO) Take 1 tablet by mouth daily.  [provider]  benzonatate (TESSALON) 100 MG capsule Take 1 capsule (100 mg total) by mouth every 8 (eight) hours. 01/12/20   Khatri, Hina, PA-C  cetirizine (ZYRTEC) 10 MG tablet Take 10 mg by mouth at bedtime. 08/02/17   [provider]  clonazePAM (KLONOPIN) 0.5 MG tablet Take 1 tablet (0.5 mg total) by mouth 2 (two) times daily as needed for anxiety. Patient not taking: No sig reported 06/05/18   Doristine Bosworth, MD  cyclobenzaprine (FLEXERIL) 10 MG tablet Take 1 tablet (10 mg total) by mouth 3 (three) times daily as needed for muscle spasms. Patient not taking: No sig reported 05/16/19   Molpus, John, MD  ELDERBERRY PO Take 1 tablet by mouth daily.    [provider]  fluticasone (FLONASE) 50 MCG/ACT nasal spray Place 2 sprays into both nostrils daily.    [provider]  HYDROcodone-acetaminophen (NORCO) 5-325 MG tablet Take 1 tablet by  mouth every 6 (six) hours as needed (for pain). Patient not taking: Reported on 12/01/2019 05/16/19   Molpus, Jonny Ruiz, MD  naproxen (NAPROSYN) 500 MG tablet Take 1 tablet (500 mg total) by mouth 2 (two) times daily. Patient not taking: Reported on 11/02/2020 01/12/20   Dietrich Pates, PA-C  ondansetron (ZOFRAN ODT) 4 MG disintegrating tablet Take 1 tablet (4 mg total) by mouth every 8 (eight) hours as needed for nausea or vomiting. Patient not taking: Reported on 11/02/2020 01/12/20   Dietrich Pates, PA-C  VITAMIN D PO Take 1 capsule by mouth daily.    [provider]    Allergies    Shellfish allergy and Latex  Review of Systems   Review of Systems  Constitutional: Positive for fever. Negative for chills.  HENT: Negative for ear pain and sore throat.   Eyes: Negative for pain and visual disturbance.  Respiratory: Negative for cough and shortness of breath.   Cardiovascular: Negative for chest pain and palpitations.  Gastrointestinal: Positive for vomiting. Negative for abdominal pain.  Genitourinary: Negative for dysuria and hematuria.  Musculoskeletal: Positive for arthralgias and myalgias. Negative for back pain.  Skin: Negative for color change and rash.  Neurological: Negative for seizures and syncope.  All other systems reviewed and are negative.   Physical Exam Updated Vital Signs BP 123/80   Pulse (!) 109   Temp 100.2 F (37.9 C) (Oral)   Resp 16   Ht 5\' 7"  (1.702 m)   Wt 66.7 kg   LMP 11/18/2020   SpO2 100%   BMI 23.02 kg/m   Physical Exam Vitals and nursing note reviewed.  Constitutional:      General: She is not in acute distress.    Appearance: She is well-developed.  HENT:     Head: Normocephalic and atraumatic.  Eyes:     Conjunctiva/sclera: Conjunctivae normal.  Cardiovascular:     Rate and Rhythm: Regular rhythm. Tachycardia present.     Heart sounds: No murmur heard.   Pulmonary:     Effort: Pulmonary effort is normal. No respiratory distress.      Breath sounds: Normal breath sounds.  Abdominal:     Palpations: Abdomen is soft.     Tenderness: There is no abdominal tenderness.  Musculoskeletal:     Cervical back: Neck supple.  Skin:    General: Skin is warm and dry.  Neurological:     Mental Status: She is alert.     ED Results / Procedures / Treatments   Labs (all labs ordered are listed, but only  abnormal results are displayed) Labs Reviewed  COMPREHENSIVE METABOLIC PANEL - Abnormal; Notable for the following components:      Result Value   Sodium 131 (*)    Potassium 3.3 (*)    CO2 20 (*)    All other components within normal limits  CBC WITH DIFFERENTIAL/PLATELET - Abnormal; Notable for the following components:   Lymphs Abs 0.6 (*)    All other components within normal limits  URINALYSIS, ROUTINE W REFLEX MICROSCOPIC - Abnormal; Notable for the following components:   Ketones, ur 40 (*)    Leukocytes,Ua SMALL (*)    All other components within normal limits  URINALYSIS, MICROSCOPIC (REFLEX) - Abnormal; Notable for the following components:   Bacteria, UA MANY (*)    All other components within normal limits  SARS CORONAVIRUS 2 (TAT 6-24 HRS)  GASTROINTESTINAL PANEL BY PCR, STOOL (REPLACES STOOL CULTURE)  URINE CULTURE  LIPASE, BLOOD  PREGNANCY, URINE    EKG None  Radiology No results found.  Procedures Procedures   Medications Ordered in ED Medications  fluconazole (DIFLUCAN) tablet 200 mg (has no administration in time range)  cefTRIAXone (ROCEPHIN) 1 g in sodium chloride 0.9 % 100 mL IVPB (has no administration in time range)  ondansetron (ZOFRAN-ODT) disintegrating tablet 4 mg (4 mg Oral Given 12/09/20 1435)  acetaminophen (TYLENOL) tablet 650 mg (650 mg Oral Given 12/09/20 1436)  sodium chloride 0.9 % bolus 1,000 mL (0 mLs Intravenous Stopped 12/09/20 1900)    ED Course  I have reviewed the triage vital signs and the nursing notes.  Pertinent labs & imaging results that were available during my  care of the patient were reviewed by me and considered in my medical decision making (see chart for details).    MDM Rules/Calculators/A&P                          Amy Reed scented with 2 days of fever, nausea, vomiting, and diarrhea.  She was noted to be febrile and tachycardic on arrival.  She was given IV hydration with good response to her heart rate.  She was also given antipyretics and nausea medication.  ED work-up revealed possible UTI.  She has a large number of bacteria in her urine as well as quite a few white blood cells.  We will treat her with Rocephin and discharged home with a prescription for Macrobid.  I do think that she has an associated viral illness at play as well.  Currently COVID-19 test is pending.  Nonetheless, she is well-appearing, and I think she is suitable for discharge home.  I did order a stool test, but she was unable to produce a stool sample.  Therefore, we cannot screen for any infectious diarrheal illnesses.  Final Clinical Impression(s) / ED Diagnoses Final diagnoses:  Viral illness  Acute cystitis without hematuria    Rx / DC Orders ED Discharge Orders         Ordered    nitrofurantoin, macrocrystal-monohydrate, (MACROBID) 100 MG capsule  2 times daily        12/09/20 1928    ondansetron (ZOFRAN) 4 MG tablet  Every 6 hours        12/09/20 1928           Koleen Distance, MD 12/09/20 [phone]

## 2020-12-09 NOTE — ED Notes (Signed)
Provider aware of fluconazole being unavailable.

## 2020-12-09 NOTE — ED Triage Notes (Signed)
C/o fever n/v/d x 2 days

## 2020-12-09 NOTE — ED Notes (Signed)
Asked pt for urine sample - will try in 15 minutes

## 2020-12-10 LAB — SARS CORONAVIRUS 2 (TAT 6-24 HRS): SARS Coronavirus 2: NEGATIVE

## 2020-12-11 LAB — URINE CULTURE: Culture: NO GROWTH

## 2021-01-11 ENCOUNTER — Telehealth: Payer: Self-pay

## 2021-01-11 NOTE — Telephone Encounter (Signed)
Return patient call - no answer or voice mail.

## 2021-01-25 ENCOUNTER — Other Ambulatory Visit: Payer: Self-pay

## 2021-01-25 MED ORDER — FLUCONAZOLE 150 MG PO TABS: 150.0000 mg | ORAL_TABLET | Freq: Once | ORAL | 0 refills | Status: AC

## 2021-01-25 NOTE — Telephone Encounter (Signed)
Patient requesting a refill on diflucan. °

## 2021-02-21 ENCOUNTER — Encounter: Payer: Self-pay | Admitting: Obstetrics & Gynecology

## 2021-02-21 ENCOUNTER — Other Ambulatory Visit: Payer: Self-pay

## 2021-02-21 ENCOUNTER — Other Ambulatory Visit (HOSPITAL_COMMUNITY)
Admission: RE | Admit: 2021-02-21 | Discharge: 2021-02-21 | Disposition: A | Discharge status: Home / Self Care | Source: Ambulatory Visit | Attending: Obstetrics & Gynecology | Admitting: Obstetrics & Gynecology

## 2021-02-21 ENCOUNTER — Ambulatory Visit (INDEPENDENT_AMBULATORY_CARE_PROVIDER_SITE_OTHER): Admitting: Obstetrics & Gynecology

## 2021-02-21 VITALS — BP 130/78 | HR 89 | Ht 67.0 in | Wt 152.9 lb

## 2021-02-21 DIAGNOSIS — N898 Other specified noninflammatory disorders of vagina: Secondary | ICD-10-CM

## 2021-02-21 MED ORDER — IBREXAFUNGERP CITRATE 150 MG PO TABS: 1.0000 | ORAL_TABLET | Freq: Once | ORAL | 1 refills | Status: AC

## 2021-02-21 NOTE — Progress Notes (Signed)
Patient ID: Amy Reed, female   DOB: [birth year], 35 y.o.   MRN: [id number]  Chief Complaint  Patient presents with   Vaginal Discharge    HPI Amy Reed is a 35 y.o. female.  [id number] Patient's last menstrual period was 02/07/2021 (approximate). She suffers from recurrent sx of vaginitis, usually yeast. She last took diflucan 3-4 weeks ago and discharge, itch and some odor has returned.  HPI  Past Medical History:  Diagnosis Date   Allergy    Asthma    GERD (gastroesophageal reflux disease)    History of multiple allergies     Past Surgical History:  Procedure Laterality Date   16 HOUR PH STUDY N/A 10/10/2017   Procedure: 24 HOUR PH STUDY-OFF of PPI;  Surgeon: Napoleon Form, MD;  Location: [facility];  Service: Endoscopy;  Laterality: N/A;   DILATION AND CURETTAGE OF UTERUS     ESOPHAGEAL MANOMETRY N/A 10/10/2017   Procedure: ESOPHAGEAL MANOMETRY (EM);  Surgeon: Napoleon Form, MD;  Location: [facility];  Service: Endoscopy;  Laterality: N/A;   PH IMPEDANCE STUDY N/A 10/10/2017   Procedure: PH IMPEDANCE STUDY-OFF of PPI;  Surgeon: Napoleon Form, MD;  Location: [facility];  Service: Endoscopy;  Laterality: N/A;    Family History  Problem Relation Age of Onset   Colon cancer Father    Cervical polyp Sister    Diabetes Maternal Grandmother    Diabetes Maternal Grandfather    Diabetes Paternal Grandmother    Cervical cancer Paternal Grandmother    Diabetes Paternal Grandfather    Colon cancer Paternal Grandfather    Colon cancer Paternal Aunt    Colon cancer Paternal Aunt    Esophageal cancer Neg Hx    Stomach cancer Neg Hx    Rectal cancer Neg Hx     Social History Social History   Tobacco Use   Smoking status: Never   Smokeless tobacco: Never  Vaping Use   Vaping Use: Never used  Substance Use Topics   Alcohol use: Yes    Comment: 2-3 times a month.    Drug use: No    Allergies  Allergen Reactions   Shellfish Allergy  Anaphylaxis   Latex Itching    Current Outpatient Medications  Medication Sig Dispense Refill   Ibrexafungerp Citrate 150 MG TABS Take 1 tablet by mouth once for 1 dose. 1 tablet 1   Current Facility-Administered Medications  Medication Dose Route Frequency Provider Last Rate Last Admin   0.9 %  sodium chloride infusion  500 mL Intravenous Once Armbruster, Willaim Rayas, MD        Review of Systems Review of Systems  Constitutional: Negative.   Respiratory: Negative.    Cardiovascular: Negative.   Genitourinary:  Positive for vaginal discharge. Negative for frequency, pelvic pain and vaginal pain.   Blood pressure 130/78, pulse 89, height 5\' 7"  (1.702 m), weight 152 lb 14.4 oz (69.4 kg), last menstrual period 02/07/2021.  Physical Exam Physical Exam Vitals and nursing note reviewed. Exam conducted with a chaperone present.  Constitutional:      Appearance: Normal appearance.  Cardiovascular:     Rate and Rhythm: Normal rate.  Pulmonary:     Effort: Pulmonary effort is normal.  Genitourinary:    General: Normal vulva.     Exam position: Lithotomy position.     Vagina: Vaginal discharge (white, not a lot, no odor) present.     Cervix: Normal.  Neurological:     Mental Status: She is  alert.    Data Reviewed Previous test for vaginitis  Assessment Suspect recurrent yeast vaginitis  Plan Meds ordered this encounter  Medications   Ibrexafungerp Citrate 150 MG TABS    Sig: Take 1 tablet by mouth once for 1 dose.    Dispense:  1 tablet    Refill:  1   Await vaginal probe result to see if BV is present. I prescribed medication she has not tried before.      Scheryl Darter 02/21/2021, 3:32 PM

## 2021-02-21 NOTE — Progress Notes (Signed)
Pt c/o recurrent issues with yeast and bacterial infections. Pt states she has tried different things to help with this including using unscented soaps and detergents. Pt states this has been an ongoing issue for years. Pt has never used boric acid suppositories to help with this issue. LMP: 02/07/21.

## 2021-02-22 LAB — CERVICOVAGINAL ANCILLARY ONLY
Bacterial Vaginitis (gardnerella): NEGATIVE
Candida Glabrata: NEGATIVE
Candida Vaginitis: POSITIVE — AB
Chlamydia: NEGATIVE
Comment: NEGATIVE
Comment: NEGATIVE
Comment: NEGATIVE
Comment: NEGATIVE
Comment: NEGATIVE
Comment: NORMAL
Neisseria Gonorrhea: NEGATIVE
Trichomonas: NEGATIVE

## 2021-03-18 ENCOUNTER — Encounter: Payer: Self-pay | Admitting: Family Medicine

## 2021-03-18 ENCOUNTER — Other Ambulatory Visit: Payer: Self-pay

## 2021-03-18 ENCOUNTER — Other Ambulatory Visit: Payer: Self-pay | Admitting: Family Medicine

## 2021-03-18 ENCOUNTER — Ambulatory Visit: Payer: Medicaid Other | Admitting: Family Medicine

## 2021-03-18 VITALS — BP 108/62 | HR 78 | Temp 98.0°F | Ht 69.0 in | Wt 158.5 lb

## 2021-03-18 DIAGNOSIS — Z800 Family history of malignant neoplasm of digestive organs: Secondary | ICD-10-CM | POA: Diagnosis not present

## 2021-03-18 DIAGNOSIS — N390 Urinary tract infection, site not specified: Secondary | ICD-10-CM

## 2021-03-18 DIAGNOSIS — J4530 Mild persistent asthma, uncomplicated: Secondary | ICD-10-CM

## 2021-03-18 LAB — URINALYSIS, ROUTINE W REFLEX MICROSCOPIC
Bilirubin Urine: NEGATIVE
Hgb urine dipstick: NEGATIVE
Ketones, ur: NEGATIVE
Nitrite: NEGATIVE
RBC / HPF: NONE SEEN (ref 0–?)
Specific Gravity, Urine: 1.025 (ref 1.000–1.030)
Total Protein, Urine: NEGATIVE
Urine Glucose: NEGATIVE
Urobilinogen, UA: 0.2 (ref 0.0–1.0)
pH: 6 (ref 5.0–8.0)

## 2021-03-18 MED ORDER — SULFAMETHOXAZOLE-TRIMETHOPRIM 800-160 MG PO TABS: 1.0000 | ORAL_TABLET | Freq: Two times a day (BID) | ORAL | 0 refills | Status: AC

## 2021-03-18 MED ORDER — FLUTICASONE PROPIONATE HFA 110 MCG/ACT IN AERO: 2.0000 | INHALATION_SPRAY | Freq: Two times a day (BID) | RESPIRATORY_TRACT | 1 refills | Status: DC

## 2021-03-18 MED ORDER — LEVOCETIRIZINE DIHYDROCHLORIDE 5 MG PO TABS: 5.0000 mg | ORAL_TABLET | Freq: Every evening | ORAL | 2 refills | Status: DC

## 2021-03-18 MED ORDER — ALBUTEROL SULFATE HFA 108 (90 BASE) MCG/ACT IN AERS: 2.0000 | INHALATION_SPRAY | Freq: Four times a day (QID) | RESPIRATORY_TRACT | 0 refills | Status: DC | PRN

## 2021-03-18 MED ORDER — EPINEPHRINE 0.3 MG/0.3ML IJ SOAJ: 0.3000 mg | INTRAMUSCULAR | 2 refills | Status: DC | PRN

## 2021-03-18 NOTE — Patient Instructions (Addendum)
Rinse your mouth out after using the inhaler daily.  Keep the diet clean and stay active.  Give [place] 2-3 business days to get the results of your labs back.   If you do not hear anything about your referral in the next 1-2 weeks, call our office and ask for an update.  Claritin (loratadine), Allegra (fexofenadine), Zyrtec (cetirizine) which is also equivalent to Xyzal (levocetirizine); these are listed in order from weakest to strongest. Generic, and therefore cheaper, options are in the parentheses.   Flonase (fluticasone); nasal spray that is over the counter. 2 sprays each nostril, once daily. Aim towards the same side eye when you spray.  There are available OTC, and the generic versions, which may be cheaper, are in parentheses. Show this to a pharmacist if you have trouble finding any of these items.  Let us know if you need anything.
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
## 2021-03-20 LAB — URINE CULTURE
MICRO NUMBER:: 12124721
Result:: NO GROWTH
SPECIMEN QUALITY:: ADEQUATE

## 2021-05-02 ENCOUNTER — Encounter: Payer: Medicaid Other | Admitting: Family Medicine

## 2021-05-05 ENCOUNTER — Encounter (HOSPITAL_BASED_OUTPATIENT_CLINIC_OR_DEPARTMENT_OTHER): Payer: Self-pay | Admitting: *Deleted

## 2021-05-05 ENCOUNTER — Other Ambulatory Visit: Payer: Self-pay

## 2021-05-05 ENCOUNTER — Emergency Department (HOSPITAL_BASED_OUTPATIENT_CLINIC_OR_DEPARTMENT_OTHER)
Admission: EM | Admit: 2021-05-05 | Discharge: 2021-05-05 | Disposition: A | Discharge status: Home / Self Care | Attending: Emergency Medicine | Admitting: Emergency Medicine

## 2021-05-05 DIAGNOSIS — Z91040 Latex allergy status: Secondary | ICD-10-CM | POA: Insufficient documentation

## 2021-05-05 DIAGNOSIS — J45909 Unspecified asthma, uncomplicated: Secondary | ICD-10-CM | POA: Insufficient documentation

## 2021-05-05 DIAGNOSIS — R55 Syncope and collapse: Secondary | ICD-10-CM | POA: Diagnosis present

## 2021-05-05 LAB — CBC WITH DIFFERENTIAL/PLATELET
Abs Immature Granulocytes: 0.03 10*3/uL (ref 0.00–0.07)
Basophils Absolute: 0 10*3/uL (ref 0.0–0.1)
Basophils Relative: 0 %
Eosinophils Absolute: 0.1 10*3/uL (ref 0.0–0.5)
Eosinophils Relative: 1 %
HCT: 43.3 % (ref 36.0–46.0)
Hemoglobin: 14.7 g/dL (ref 12.0–15.0)
Immature Granulocytes: 0 %
Lymphocytes Relative: 32 %
Lymphs Abs: 2.4 10*3/uL (ref 0.7–4.0)
MCH: 33.2 pg (ref 26.0–34.0)
MCHC: 33.9 g/dL (ref 30.0–36.0)
MCV: 97.7 fL (ref 80.0–100.0)
Monocytes Absolute: 0.6 10*3/uL (ref 0.1–1.0)
Monocytes Relative: 8 %
Neutro Abs: 4.5 10*3/uL (ref 1.7–7.7)
Neutrophils Relative %: 59 %
Platelets: 263 10*3/uL (ref 150–400)
RBC: 4.43 MIL/uL (ref 3.87–5.11)
RDW: 11.8 % (ref 11.5–15.5)
WBC: 7.6 10*3/uL (ref 4.0–10.5)
nRBC: 0 % (ref 0.0–0.2)

## 2021-05-05 LAB — URINALYSIS, ROUTINE W REFLEX MICROSCOPIC
Bilirubin Urine: NEGATIVE
Glucose, UA: NEGATIVE mg/dL
Hgb urine dipstick: NEGATIVE
Ketones, ur: NEGATIVE mg/dL
Leukocytes,Ua: NEGATIVE
Nitrite: NEGATIVE
Protein, ur: NEGATIVE mg/dL
Specific Gravity, Urine: 1.005 (ref 1.005–1.030)
pH: 6 (ref 5.0–8.0)

## 2021-05-05 LAB — COMPREHENSIVE METABOLIC PANEL
ALT: 14 U/L (ref 0–44)
AST: 19 U/L (ref 15–41)
Albumin: 4.8 g/dL (ref 3.5–5.0)
Alkaline Phosphatase: 66 U/L (ref 38–126)
Anion gap: 10 (ref 5–15)
BUN: 14 mg/dL (ref 6–20)
CO2: 26 mmol/L (ref 22–32)
Calcium: 9.9 mg/dL (ref 8.9–10.3)
Chloride: 103 mmol/L (ref 98–111)
Creatinine, Ser: 0.92 mg/dL (ref 0.44–1.00)
GFR, Estimated: >60 mL/min (ref >60)
Glucose, Bld: 87 mg/dL (ref 70–99)
Potassium: 3.9 mmol/L (ref 3.5–5.1)
Sodium: 139 mmol/L (ref 135–145)
Total Bilirubin: 0.2 mg/dL — ABNORMAL LOW (ref 0.3–1.2)
Total Protein: 8.3 g/dL — ABNORMAL HIGH (ref 6.5–8.1)

## 2021-05-05 LAB — PREGNANCY, URINE: Preg Test, Ur: NEGATIVE

## 2021-05-05 LAB — CBG MONITORING, ED: Glucose-Capillary: 131 mg/dL — ABNORMAL HIGH (ref 70–99)

## 2021-05-05 MED ORDER — SODIUM CHLORIDE 0.9 % IV BOLUS
500.0000 mL | Freq: Once | INTRAVENOUS | Status: AC
  Administered 2021-05-05: 500 mL via INTRAVENOUS

## 2021-05-05 NOTE — ED Triage Notes (Signed)
States while cutting hair she passed out.

## 2021-05-05 NOTE — Discharge Instructions (Addendum)
Make sure you are staying well-hydrated with water. Follow with your primary care doctor as needed for recheck of symptoms. Return to the emergency room with any new, worsening, concerning symptoms

## 2021-05-05 NOTE — ED Notes (Addendum)
Pt laid down flat at 20:00 for orthostatics vitals

## 2021-05-05 NOTE — ED Provider Notes (Signed)
Orthostatics checked.  She was given IV fluids.  On recheck patient ambulating here to and from the bathroom.  States that she feels better.  Encouraged her to increase hydration and follow-up with PCP.  Return precautions given.   Dietrich Pates, PA-C 05/05/21 2107    Maia Plan, MD 05/06/21 1229

## 2021-05-05 NOTE — ED Provider Notes (Signed)
[facility] EMERGENCY DEPARTMENT Provider Note   CSN: [id number] Arrival date & time: 05/05/21  1822     History Chief Complaint  Patient presents with   Loss of Consciousness    Amy Reed is a 35 y.o. female presenting for evaluation of presyncope.  Patient states around 545, approximately an hour and a half prior to arrival, she was standing when she suddenly felt like she was going to pass out.  She states she got more short of breath than normal at the time, and was able to sit down before she passed out.  When she stood up again, she continued to feel the same way, and states that everything went black and people caught her.  She did not trip fall or hit the ground.  She not truly passed out.  She drank a body armor sports drink.  Currently she states she feels slightly jittery and anxious, but does not feel presyncopal.  She denies any increasing chest pain prior to the events.  No aches, vision changes, slurred speech, cough, nausea, vomiting, abdominal pain, urinary symptoms, abnormal bowel movements.  She denies recent travel, surgeries, immobilization, history of cancer, history of previous DVT/PE, or hormone use.  History of asthma for which she takes albuterol, no other medical problems  HPI     Past Medical History:  Diagnosis Date   Allergy    Asthma    GERD (gastroesophageal reflux disease)     Patient Active Problem List   Diagnosis Date Noted   Family hx of colon cancer requiring screening colonoscopy 09/11/2017   Globus sensation 09/11/2017   Gastroesophageal reflux disease without esophagitis 08/07/2017    Past Surgical History:  Procedure Laterality Date   24 HOUR PH STUDY N/A 10/10/2017   Procedure: 24 HOUR PH STUDY-OFF of PPI;  Surgeon: Napoleon Form, MD;  Location: [facility];  Service: Endoscopy;  Laterality: N/A;   DILATION AND CURETTAGE OF UTERUS     ESOPHAGEAL MANOMETRY N/A 10/10/2017   Procedure: ESOPHAGEAL MANOMETRY (EM);   Surgeon: Napoleon Form, MD;  Location: [facility];  Service: Endoscopy;  Laterality: N/A;   PH IMPEDANCE STUDY N/A 10/10/2017   Procedure: PH IMPEDANCE STUDY-OFF of PPI;  Surgeon: Napoleon Form, MD;  Location: [facility];  Service: Endoscopy;  Laterality: N/A;     OB History     Gravida  2   Para  1   Term      Preterm  1   AB  1   Living  1      SAB  1   IAB      Ectopic      Multiple      Live Births  1           Family History  Problem Relation Age of Onset   Colon cancer Father    Cervical polyp Sister    Diabetes Maternal Grandmother    Diabetes Maternal Grandfather    Diabetes Paternal Grandmother    Cervical cancer Paternal Grandmother    Diabetes Paternal Grandfather    Colon cancer Paternal Grandfather    Colon cancer Paternal Aunt    Colon cancer Paternal Aunt    Esophageal cancer Neg Hx    Stomach cancer Neg Hx    Rectal cancer Neg Hx     Social History   Tobacco Use   Smoking status: Never   Smokeless tobacco: Never  Vaping Use   Vaping Use: Never used  Substance Use Topics   Alcohol use: Yes    Comment: 2-3 times a month.    Drug use: No    Home Medications Prior to Admission medications   Medication Sig Start Date End Date Taking? Authorizing Provider  albuterol (VENTOLIN HFA) 108 (90 Base) MCG/ACT inhaler Inhale 2 puffs into the lungs every 6 (six) hours as needed for wheezing or shortness of breath. 03/18/21   Sharlene Dory, DO  EPINEPHrine (EPIPEN 2-PAK) 0.3 mg/0.3 mL IJ SOAJ injection Inject 0.3 mg into the muscle as needed for anaphylaxis. 03/18/21   Sharlene Dory, DO  fluticasone (FLOVENT HFA) 110 MCG/ACT inhaler Inhale 2 puffs into the lungs in the morning and at bedtime. Rinse mouth out after use. 03/18/21   Wendling, Jilda Roche, DO  levocetirizine (XYZAL) 5 MG tablet Take 1 tablet (5 mg total) by mouth every evening. 03/18/21   Sharlene Dory, DO    Allergies    Shellfish  allergy and Latex  Review of Systems   Review of Systems  Respiratory:  Positive for shortness of breath (resolved).   Neurological:  Positive for syncope (presyncope).  Psychiatric/Behavioral:  The patient is nervous/anxious.   All other systems reviewed and are negative.  Physical Exam Updated Vital Signs BP 133/84   Pulse 73   Temp 98.4 F (36.9 C) (Oral)   Resp 18   Ht 5\' 9"  (1.753 m)   Wt 71.9 kg   LMP 04/21/2021   SpO2 100%   BMI 23.41 kg/m   Physical Exam Vitals and nursing note reviewed.  Constitutional:      General: She is not in acute distress.    Appearance: Normal appearance.     Comments: Resting in the bed in no acute distress  HENT:     Head: Normocephalic and atraumatic.  Eyes:     Conjunctiva/sclera: Conjunctivae normal.     Pupils: Pupils are equal, round, and reactive to light.  Cardiovascular:     Rate and Rhythm: Normal rate and regular rhythm.     Pulses: Normal pulses.  Pulmonary:     Effort: Pulmonary effort is normal. No respiratory distress.     Breath sounds: Normal breath sounds. No wheezing.     Comments: Speaking in full sentences.  Clear lung sounds in all fields. Abdominal:     General: There is no distension.     Palpations: Abdomen is soft. There is no mass.     Tenderness: There is no abdominal tenderness. There is no guarding.  Musculoskeletal:        General: Normal range of motion.     Cervical back: Normal range of motion and neck supple.     Right lower leg: No edema.     Left lower leg: No edema.  Skin:    General: Skin is warm and dry.     Capillary Refill: Capillary refill takes less than 2 seconds.  Neurological:     Mental Status: She is alert and oriented to person, place, and time.     GCS: GCS eye subscore is 4. GCS verbal subscore is 5. GCS motor subscore is 6.     Cranial Nerves: Cranial nerves are intact.     Sensory: Sensation is intact.     Motor: Motor function is intact.  Psychiatric:        Mood and  Affect: Mood and affect normal.        Speech: Speech normal.  Behavior: Behavior normal.    ED Results / Procedures / Treatments   Labs (all labs ordered are listed, but only abnormal results are displayed) Labs Reviewed  CBG MONITORING, ED - Abnormal; Notable for the following components:      Result Value   Glucose-Capillary 131 (*)    All other components within normal limits  CBC WITH DIFFERENTIAL/PLATELET  COMPREHENSIVE METABOLIC PANEL  URINALYSIS, ROUTINE W REFLEX MICROSCOPIC  PREGNANCY, URINE    EKG EKG Interpretation  Date/Time:  Thursday May 05 2021 18:49:51 EDT Ventricular Rate:  85 PR Interval:  154 QRS Duration: 92 QT Interval:  330 QTC Calculation: 392 R Axis:   65 Text Interpretation: Normal sinus rhythm  Similar to Feb 2022 tracing Confirmed by Alona Bene [phone]) on 05/05/2021 6:53:53 PM  Radiology No results found.  Procedures Procedures   Medications Ordered in ED Medications  sodium chloride 0.9 % bolus 500 mL (has no administration in time range)    ED Course  I have reviewed the triage vital signs and the nursing notes.  Pertinent labs & imaging results that were available during my care of the patient were reviewed by me and considered in my medical decision making (see chart for details).    MDM Rules/Calculators/A&P                           Pt presenting for evalaution of near syncope. On exam, pt appears nontoxic. No gross neuro deficits. She is low risk for syncope. She was standing for an extended time when it happened, which could be the cause.  However will check labs to ensure no anemia, electrolyte abnormality, AKI.  Will check urine to rule out infection or pregnancy.  EKG obtained from triage shows normal sinus rhythm, no concerning arrhythmias.  Labs interpreted by me, overall reassuring.  Urine without signs of infection.  Pt signed out to [employer], PA-C for f/u on orthostatics.  Plan for discharge if  normal  Final Clinical Impression(s) / ED Diagnoses Final diagnoses:  None    Rx / DC Orders ED Discharge Orders     None        Alveria Apley, PA-C 05/05/21 2005    Long, Joshua G, MD 05/06/21 1229

## 2021-07-26 ENCOUNTER — Ambulatory Visit (INDEPENDENT_AMBULATORY_CARE_PROVIDER_SITE_OTHER): Admitting: Family Medicine

## 2021-07-26 ENCOUNTER — Encounter: Payer: Self-pay | Admitting: Family Medicine

## 2021-07-26 ENCOUNTER — Other Ambulatory Visit: Payer: Self-pay

## 2021-07-26 VITALS — BP 108/62 | HR 90 | Temp 98.0°F | Ht 67.0 in | Wt 155.5 lb

## 2021-07-26 DIAGNOSIS — R5383 Other fatigue: Secondary | ICD-10-CM | POA: Diagnosis not present

## 2021-07-26 MED ORDER — FLUTICASONE PROPIONATE HFA 110 MCG/ACT IN AERO: 2.0000 | INHALATION_SPRAY | Freq: Two times a day (BID) | RESPIRATORY_TRACT | 1 refills | Status: DC

## 2021-07-26 NOTE — Progress Notes (Signed)
Chief Complaint  Patient presents with   Fatigue    6 weeks Dizziness     Subjective: Patient is a 35 y.o. female here for fatigue.  Going on for 6 weeks. Has felt fatigue, sob and dizziness. Eating and drinking normally. No recent illness or fevers. Mood is stable. Feels she is gaining weight unintentionally.  Cycles have been heavier than usual lately, but has not been correlated with her symptoms.  She does snore at night but does not have reports from her wife that she stops breathing at night.  She will sometimes wake up feeling like she has not slept at all.  He does mention that she has a history of low vitamin D and low potassium.  Past Medical History:  Diagnosis Date   Allergy    Asthma    GERD (gastroesophageal reflux disease)     Objective: BP 108/62   Pulse 90   Temp 98 F (36.7 C) (Oral)   Ht 5\' 7"  (1.702 m)   Wt 155 lb 8 oz (70.5 kg)   SpO2 97%   BMI 24.35 kg/m  General: Awake, appears stated age Neck: Symmetric, supple, no thyromegaly or nodules appreciated. Heart: RRR, no LE edema Lungs: CTAB, no rales, wheezes or rhonchi. No accessory muscle use Psych: Age appropriate judgment and insight, normal affect and mood  Assessment and Plan: Fatigue, unspecified type - Plan: CBC, Comprehensive metabolic panel, T4, free, TSH, VITAMIN D 25 Hydroxy (Vit-D Deficiency, Fractures)  New diagnosis, uncertain prognosis, work-up planned.  She has a history of low vitamin D so we will check that.  Check thyroid levels in addition to blood count and general metabolic panel.  She also has a history of low potassium which we will want to follow-up on.  Stay hydrated and keep her diet healthy, stay active.  If work-up is unremarkable and she still having issues, will refer to the sleep team for possible sleep study. The patient voiced understanding and agreement to the plan.  [place], DO 07/26/21  4:53 PM

## 2021-07-26 NOTE — Patient Instructions (Signed)
Give [place] 2-3 business days to get the results of your labs back.   Keep the diet clean and stay active.

## 2021-07-27 ENCOUNTER — Other Ambulatory Visit: Payer: Self-pay | Admitting: Family Medicine

## 2021-07-27 DIAGNOSIS — R0683 Snoring: Secondary | ICD-10-CM

## 2021-07-27 DIAGNOSIS — R5383 Other fatigue: Secondary | ICD-10-CM

## 2021-07-27 LAB — COMPREHENSIVE METABOLIC PANEL
ALT: 9 U/L (ref 0–35)
AST: 15 U/L (ref 0–37)
Albumin: 4.5 g/dL (ref 3.5–5.2)
Alkaline Phosphatase: 58 U/L (ref 39–117)
BUN: 9 mg/dL (ref 6–23)
CO2: 26 mEq/L (ref 19–32)
Calcium: 9.6 mg/dL (ref 8.4–10.5)
Chloride: 103 mEq/L (ref 96–112)
Creatinine, Ser: 0.89 mg/dL (ref 0.40–1.20)
GFR: 83.66 mL/min (ref >60.00)
Glucose, Bld: 79 mg/dL (ref 70–99)
Potassium: 3.9 mEq/L (ref 3.5–5.1)
Sodium: 138 mEq/L (ref 135–145)
Total Bilirubin: 0.6 mg/dL (ref 0.2–1.2)
Total Protein: 7.5 g/dL (ref 6.0–8.3)

## 2021-07-27 LAB — CBC
HCT: 40 % (ref 36.0–46.0)
Hemoglobin: 13.1 g/dL (ref 12.0–15.0)
MCHC: 32.6 g/dL (ref 30.0–36.0)
MCV: 99.3 fl (ref 78.0–100.0)
Platelets: 220 10*3/uL (ref 150.0–400.0)
RBC: 4.03 Mil/uL (ref 3.87–5.11)
RDW: 12.6 % (ref 11.5–15.5)
WBC: 5.8 10*3/uL (ref 4.0–10.5)

## 2021-07-27 LAB — TSH: TSH: 1.52 u[IU]/mL (ref 0.35–5.50)

## 2021-07-27 LAB — VITAMIN D 25 HYDROXY (VIT D DEFICIENCY, FRACTURES): VITD: 31.59 ng/mL (ref 30.00–100.00)

## 2021-07-27 LAB — T4, FREE: Free T4: 0.83 ng/dL (ref 0.60–1.60)

## 2021-09-16 ENCOUNTER — Institutional Professional Consult (permissible substitution): Payer: Medicaid Other | Admitting: Pulmonary Disease

## 2021-09-27 ENCOUNTER — Ambulatory Visit: Admitting: Gastroenterology

## 2021-10-19 DIAGNOSIS — K5900 Constipation, unspecified: Secondary | ICD-10-CM | POA: Insufficient documentation

## 2021-10-20 ENCOUNTER — Encounter: Payer: Self-pay | Admitting: Nurse Practitioner

## 2021-10-20 ENCOUNTER — Ambulatory Visit (INDEPENDENT_AMBULATORY_CARE_PROVIDER_SITE_OTHER): Admitting: Nurse Practitioner

## 2021-10-20 VITALS — BP 112/64 | HR 64 | Ht 69.0 in | Wt 149.8 lb

## 2021-10-20 DIAGNOSIS — R197 Diarrhea, unspecified: Secondary | ICD-10-CM | POA: Diagnosis not present

## 2021-10-20 NOTE — Progress Notes (Signed)
Agree with assessment and plan as outlined.  

## 2021-10-20 NOTE — Patient Instructions (Addendum)
LABS:   Please proceed to the basement level to pick up stool tests before leaving today. Press "B" on the elevator. The lab is located at the first door on the left as you exit the elevator.  HEALTHCARE LAWS AND MY CHART RESULTS:   Due to recent changes in healthcare laws, you may see results of your imaging and/or laboratory studies on MyChart before I have had a chance to review them.  I understand that in some cases there may be results that are confusing or concerning to you. Please understand that not all results are received at the same time and often I may need to interpret multiple results in order to provide you with the best plan of care or course of treatment. Therefore, I ask that you please give me 48 hours to thoroughly review all your results before contacting my office for clarification.   We have scheduled you a follow up with Dr. Adela Lank on 11/17/21 at 9:00am.  After you submit the stool studies you can take Imodium as directed on the box.  BMI:  If you are age 22 or older, your body mass index should be between 23-30. Your Body mass index is 22.12 kg/m. If this is out of the aforementioned range listed, please consider follow up with your Primary Care Provider.  If you are age 46 or younger, your body mass index should be between 19-25. Your Body mass index is 22.12 kg/m. If this is out of the aformentioned range listed, please consider follow up with your Primary Care Provider.   MY CHART:  The [facility] providers would like to encourage you to use [facility] to communicate with providers for non-urgent requests or questions.  Due to long hold times on the telephone, sending your provider a message by [facility] may be a faster and more efficient way to get a response.  Please allow 48 business hours for a response.  Please remember that this is for non-urgent requests.   Thank you for trusting me with your gastrointestinal care!    Willette Cluster, NP

## 2021-10-20 NOTE — Progress Notes (Signed)
ASSESSMENT AND PLAN    # 36 yo female with one month history of diarrhea, nausea and bloating. Having several loose, non-bloody brown BMs a day. No recent diet, medication changes or antibiotics. No associated weight loss, reports significant gain.  Consider infectious etiology. Microscopic colitis possible but low suspicion. Abdominal exam benign.  --Normal TSH --Stool GI path panel including giardia, fecal lactoferrin.  --No endoscopic abnormalities of small bowel on EGD in 2019. Celiac disease not suspected but if stool studies negative and diarrhea persists will obtain celiac labs --After stools studies can take 2 mg imodium per box instructions  --Will contact her with results. Follow up in ~ 3-4 weeks. .    # [place]. She reports colon cancer in paternal Grandfather, paternal uncle and her her Father She says her Father had colon cancer at age 40 with several recurrence and currently has colon cancer now and followed at [facility]. Her siblings have had colon polyps. Sounds like her siblings underwent genetic testing but she doesn' have any details.  --She had a normal colonoscopy ( for [facility]) in January 2019.   # History of dysphagia. In 2019 she had a normal EGD, normal pH study. Esophageal Manometry showed elevated LES pressure but ? If contributing to symptoms She hasn't been having any swallowing issues   HISTORY OF PRESENT ILLNESS     Chief Complaint :  diarrhea, nausea,   Amy Reed is a 36 y.o. female with a past medical history significant for asthma. See PMH below for any additional history.   Patient known to Dr .Havery Moros. She was seen in 2019 for evaluation of dysphagia and [facility] of colon cancer.   Amy Reed is referred by PCP for diarrhea. She is accompanied by her wife and is here for evaluation of diarrhea which started about one ago. No blood in stool as far as she knows. She is having about 4-5 loose BMs a day. Having some nocturnal BMs. No significant abdominal pain. She  has had some nausea associated with the diarrhea. No vomiting. No antibiotics in last several months. No medication or diet changes. Prior to a month ago she had solid BMs once a day` No weight loss. She has gained 30 pounds over the last years.  05/05/21 ED visit - syncope. EKG with NSR. Labs normal, neg pregnancy test. Normal TSH. UA was okay. .    Data Reviewed:  CBC Latest Ref Rng & Units 07/26/2021 05/05/2021 12/09/2020  WBC 4.0 - 10.5 K/uL 5.8 7.6 6.4  Hemoglobin 12.0 - 15.0 g/dL 13.1 14.7 14.6  Hematocrit 36.0 - 46.0 % 40.0 43.3 42.7  Platelets 150.0 - 400.0 K/uL 220.0 263 201    Lab Results  Component Value Date   LIPASE 31 12/09/2020   CMP Latest Ref Rng & Units 07/26/2021 05/05/2021 12/09/2020  Glucose 70 - 99 mg/dL 79 87 96  BUN 6 - 23 mg/dL 9 14 13   Creatinine 0.40 - 1.20 mg/dL 0.89 0.92 0.77  Sodium 135 - 145 mEq/L 138 139 131(L)  Potassium 3.5 - 5.1 mEq/L 3.9 3.9 3.3(L)  Chloride 96 - 112 mEq/L 103 103 102  CO2 19 - 32 mEq/L 26 26 20(L)  Calcium 8.4 - 10.5 mg/dL 9.6 9.9 9.1  Total Protein 6.0 - 8.3 g/dL 7.5 8.3(H) 7.5  Total Bilirubin 0.2 - 1.2 mg/dL 0.6 0.2(L) 0.7  Alkaline Phos 39 - 117 U/L 58 66 50  AST 0 - 37 U/L 15 19 19   ALT 0 - 35  U/L 9 14 15     PREVIOUS GI EVALUATIONS:   Jan 2019 colonoscopy for [facility] of colon cancer  --normal.   Jan 2019 EGD for dysphagia.  --normal.   Feb 2019 pH testing and Esophageal Manometry --elevated LES resting pressure. Unclear if this contributed to symptoms  Past Medical History:  Diagnosis Date   Allergy    Asthma    Family history of colon cancer    GERD (gastroesophageal reflux disease)      Past Surgical History:  Procedure Laterality Date   30 HOUR [place] STUDY N/A 10/10/2017   Procedure: 24 HOUR PH STUDY-OFF of PPI;  Surgeon: Mauri Pole, MD;  Location: [facility];  Service: Endoscopy;  Laterality: N/A;   DILATION AND CURETTAGE OF UTERUS     ESOPHAGEAL MANOMETRY N/A 10/10/2017   Procedure: ESOPHAGEAL MANOMETRY  (EM);  Surgeon: Mauri Pole, MD;  Location: [facility];  Service: Endoscopy;  Laterality: N/A;   [place] IMPEDANCE STUDY N/A 10/10/2017   Procedure: PH IMPEDANCE STUDY-OFF of PPI;  Surgeon: Mauri Pole, MD;  Location: [facility];  Service: Endoscopy;  Laterality: N/A;   Family History  Problem Relation Age of Onset   Colon cancer Father    Colon polyps Sister    Cervical polyp Sister    Colon polyps Brother    Colon cancer Paternal Aunt    Colon cancer Paternal Aunt    Diabetes Maternal Grandmother    Diabetes Maternal Grandfather    Diabetes Paternal Grandmother    Cervical cancer Paternal Grandmother    Diabetes Paternal Grandfather    Colon cancer Paternal Grandfather    Esophageal cancer Neg Hx    Stomach cancer Neg Hx    Rectal cancer Neg Hx    Social History   Tobacco Use   Smoking status: Never   Smokeless tobacco: Never  Vaping Use   Vaping Use: Never used  Substance Use Topics   Alcohol use: Yes    Comment: 2-3 times a month.    Drug use: No   Current Outpatient Medications  Medication Sig Dispense Refill   albuterol (VENTOLIN HFA) 108 (90 Base) MCG/ACT inhaler Inhale 2 puffs into the lungs every 6 (six) hours as needed for wheezing or shortness of breath. 8 g 0   EPINEPHrine (EPIPEN 2-PAK) 0.3 mg/0.3 mL IJ SOAJ injection Inject 0.3 mg into the muscle as needed for anaphylaxis. 1 each 2   Current Facility-Administered Medications  Medication Dose Route Frequency Provider Last Rate Last Admin   0.9 %  sodium chloride infusion  500 mL Intravenous Once Armbruster, Carlota Raspberry, MD       Allergies  Allergen Reactions   Shellfish Allergy Anaphylaxis   Latex Itching     Review of Systems: Positive for general malaise.  All other systems reviewed and negative except where noted in HPI.    PHYSICAL EXAM :    Wt Readings from Last 3 Encounters:  10/20/21 149 lb 12.8 oz (67.9 kg)  07/26/21 155 lb 8 oz (70.5 kg)  05/05/21 158 lb 8.2 oz (71.9 kg)     BP 112/64    Pulse 64    Ht 5\' 9"  (1.753 m)    Wt 149 lb 12.8 oz (67.9 kg)    SpO2 99%    BMI 22.12 kg/m  Constitutional:  Generally well appearing female in no acute distress. Psychiatric: Pleasant. Normal mood and affect. Behavior is normal. EENT: Pupils normal.  Conjunctivae are normal. No scleral icterus. Neck supple.  Cardiovascular: Normal rate, regular rhythm. No edema Pulmonary/chest: Effort normal and breath sounds normal. No wheezing, rales or rhonchi. Abdominal: Soft, nondistended, nontender. Bowel sounds active throughout. There are no masses palpable. No hepatomegaly. Neurological: Alert and oriented to person place and time. Skin: Skin is warm and dry. No rashes noted.  Tye Savoy, NP  10/20/2021, 10:30 AM  Cc:  Referring Provider Shelda Pal*

## 2021-10-24 ENCOUNTER — Ambulatory Visit: Payer: Medicaid Other | Admitting: Family Medicine

## 2021-11-17 ENCOUNTER — Ambulatory Visit: Admitting: Gastroenterology

## 2021-11-21 ENCOUNTER — Institutional Professional Consult (permissible substitution): Payer: Medicaid Other | Admitting: Pulmonary Disease

## 2022-03-21 ENCOUNTER — Ambulatory Visit (INDEPENDENT_AMBULATORY_CARE_PROVIDER_SITE_OTHER): Admitting: Family Medicine

## 2022-03-21 ENCOUNTER — Encounter: Payer: Self-pay | Admitting: Family Medicine

## 2022-03-21 VITALS — BP 108/62 | HR 85 | Temp 98.2°F | Ht 67.0 in | Wt 148.0 lb

## 2022-03-21 DIAGNOSIS — R0683 Snoring: Secondary | ICD-10-CM | POA: Diagnosis not present

## 2022-03-21 DIAGNOSIS — J4530 Mild persistent asthma, uncomplicated: Secondary | ICD-10-CM

## 2022-03-21 DIAGNOSIS — R0600 Dyspnea, unspecified: Secondary | ICD-10-CM

## 2022-03-21 DIAGNOSIS — R5383 Other fatigue: Secondary | ICD-10-CM | POA: Diagnosis not present

## 2022-03-21 MED ORDER — MONTELUKAST SODIUM 10 MG PO TABS: 10.0000 mg | ORAL_TABLET | Freq: Every day | ORAL | 3 refills | Status: DC

## 2022-03-21 MED ORDER — FLUCONAZOLE 150 MG PO TABS: ORAL_TABLET | ORAL | 0 refills | Status: DC

## 2022-03-21 MED ORDER — ALBUTEROL SULFATE HFA 108 (90 BASE) MCG/ACT IN AERS: 2.0000 | INHALATION_SPRAY | Freq: Four times a day (QID) | RESPIRATORY_TRACT | 2 refills | Status: DC | PRN

## 2022-03-21 MED ORDER — LEVOCETIRIZINE DIHYDROCHLORIDE 5 MG PO TABS: 5.0000 mg | ORAL_TABLET | Freq: Every evening | ORAL | 2 refills | Status: DC

## 2022-03-21 NOTE — Patient Instructions (Addendum)
Someone will reach out regarding your heart ultrasound.   Continue the nasal spray and daily allergy medicine with the Singulair.  Get your sleep study.  Keep the diet clean and stay active.  Let us know if you need anything.

## 2022-03-21 NOTE — Progress Notes (Signed)
Chief Complaint  Patient presents with   Breathing Problem    Inhaler prescription  Legs swelling     Subjective: Patient is a 36 y.o. female here for sob.  Over the past few months, has had more SOB.  She has a history of asthma.  Her rescue inhaler has not been helpful.  She has not been coughing or having significant wheezing.  She has difficulty taking a full deep breath.  She has shortness of breath at both rest and with exertion.  She will intermittently have lower extremity swelling around her feet and ankles.  She denies any chest pain.  She had an appointment with a cardiologist but did not show up.  This was for syncope which she has not had since that time.  No unexplained weight gain.  Past Medical History:  Diagnosis Date   Allergy    Asthma    Family history of colon cancer    GERD (gastroesophageal reflux disease)     Objective: BP 108/62   Pulse 85   Temp 98.2 F (36.8 C) (Oral)   Ht 5\' 7"  (1.702 m)   Wt 148 lb (67.1 kg)   SpO2 99%   BMI 23.18 kg/m  General: Awake, appears stated age HEENT: Ears are patent without drainage/erythema, TMs negative bilaterally, nares are patent without rhinorrhea or edema, MMM Heart: RRR, no LE edema Lungs: CTAB, no rales, wheezes or rhonchi. No accessory muscle use Psych: Age appropriate judgment and insight, normal affect and mood  Assessment and Plan: Mild persistent asthma, unspecified whether complicated - Plan: albuterol (VENTOLIN HFA) 108 (90 Base) MCG/ACT inhaler, montelukast (SINGULAIR) 10 MG tablet  Dyspnea, unspecified type - Plan: ECHOCARDIOGRAM COMPLETE  Snoring - Plan: Ambulatory referral to Pulmonology  Fatigue, unspecified type - Plan: Ambulatory referral to Pulmonology  Chronic, uncontrolled.  Add Singulair 10 mg daily.  Follow-up in 1 month to recheck. We will check echo to rule out cardiac involvement. 3/4.  Will refer back to pulmonology for sleep evaluation.  She has witnessed apneic episodes per her  wife also. The patient voiced understanding and agreement to the plan.  [place], DO 03/21/22  3:24 PM

## 2022-04-04 ENCOUNTER — Ambulatory Visit (HOSPITAL_BASED_OUTPATIENT_CLINIC_OR_DEPARTMENT_OTHER)
Admission: RE | Admit: 2022-04-04 | Discharge: 2022-04-04 | Disposition: A | Discharge status: Home / Self Care | Source: Ambulatory Visit | Attending: Family Medicine | Admitting: Family Medicine

## 2022-04-04 DIAGNOSIS — R0609 Other forms of dyspnea: Secondary | ICD-10-CM

## 2022-04-04 DIAGNOSIS — R0600 Dyspnea, unspecified: Secondary | ICD-10-CM | POA: Insufficient documentation

## 2022-04-04 NOTE — Progress Notes (Signed)
  Echocardiogram 2D Echocardiogram has been performed.  Roosvelt Maser F 04/04/2022, 3:54 PM

## 2022-04-05 LAB — ECHOCARDIOGRAM COMPLETE
AR max vel: 2.2 cm2
AV Area VTI: 2.04 cm2
AV Area mean vel: 2.02 cm2
AV Mean grad: 5 mmHg
AV Peak grad: 8.6 mmHg
Ao pk vel: 1.47 m/s
Area-P 1/2: 4.12 cm2
S' Lateral: 3 cm

## 2022-04-18 ENCOUNTER — Encounter: Payer: Self-pay | Admitting: Family Medicine

## 2022-04-18 ENCOUNTER — Other Ambulatory Visit: Payer: Self-pay | Admitting: Family Medicine

## 2022-04-19 ENCOUNTER — Ambulatory Visit (INDEPENDENT_AMBULATORY_CARE_PROVIDER_SITE_OTHER)

## 2022-04-19 ENCOUNTER — Other Ambulatory Visit (HOSPITAL_COMMUNITY)
Admission: RE | Admit: 2022-04-19 | Discharge: 2022-04-19 | Disposition: A | Discharge status: Home / Self Care | Source: Ambulatory Visit | Attending: Obstetrics and Gynecology | Admitting: Obstetrics and Gynecology

## 2022-04-19 VITALS — BP 133/78 | HR 89 | Ht 67.0 in | Wt 148.0 lb

## 2022-04-19 DIAGNOSIS — N898 Other specified noninflammatory disorders of vagina: Secondary | ICD-10-CM

## 2022-04-19 NOTE — Progress Notes (Signed)
Agree with nurses's documentation of this patient's clinic encounter.  Teonna Coonan L, MD  

## 2022-04-19 NOTE — Progress Notes (Addendum)
SUBJECTIVE:  36 y.o. female complains of foul and grey vaginal discharge for 5 day(s). Denies abnormal vaginal bleeding or significant pelvic pain or fever. No UTI symptoms. Denies history of known exposure to STD.  Patient's last menstrual period was 04/05/2022.  OBJECTIVE:  She appears well, afebrile. Urine dipstick: not done.  ASSESSMENT:  Vaginal Discharge  Vaginal Odor   PLAN:  GC, chlamydia, trichomonas, BVAG, CVAG probe sent to lab. Treatment: To be determined once lab results are received ROV prn if symptoms persist or worsen.

## 2022-04-21 ENCOUNTER — Telehealth: Payer: Self-pay

## 2022-04-21 LAB — CERVICOVAGINAL ANCILLARY ONLY
Bacterial Vaginitis (gardnerella): POSITIVE — AB
Candida Glabrata: NEGATIVE
Candida Vaginitis: POSITIVE — AB
Chlamydia: NEGATIVE
Comment: NEGATIVE
Comment: NEGATIVE
Comment: NEGATIVE
Comment: NEGATIVE
Comment: NEGATIVE
Comment: NORMAL
Neisseria Gonorrhea: NEGATIVE
Trichomonas: NEGATIVE

## 2022-04-21 MED ORDER — METRONIDAZOLE 500 MG PO TABS: 500.0000 mg | ORAL_TABLET | Freq: Two times a day (BID) | ORAL | 0 refills | Status: DC

## 2022-04-21 MED ORDER — FLUCONAZOLE 150 MG PO TABS: 150.0000 mg | ORAL_TABLET | Freq: Once | ORAL | 0 refills | Status: AC

## 2022-04-21 NOTE — Telephone Encounter (Signed)
S/w pt and advised of results. Rx sent to pharmacy

## 2022-06-05 ENCOUNTER — Encounter: Payer: Self-pay | Admitting: Family Medicine

## 2022-06-05 ENCOUNTER — Ambulatory Visit (INDEPENDENT_AMBULATORY_CARE_PROVIDER_SITE_OTHER): Admitting: Family Medicine

## 2022-06-05 VITALS — BP 110/76 | HR 71 | Temp 98.2°F | Ht 67.5 in | Wt 147.1 lb

## 2022-06-05 DIAGNOSIS — J4530 Mild persistent asthma, uncomplicated: Secondary | ICD-10-CM | POA: Diagnosis not present

## 2022-06-05 DIAGNOSIS — M79604 Pain in right leg: Secondary | ICD-10-CM

## 2022-06-05 MED ORDER — ALBUTEROL SULFATE HFA 108 (90 BASE) MCG/ACT IN AERS: 2.0000 | INHALATION_SPRAY | Freq: Four times a day (QID) | RESPIRATORY_TRACT | 2 refills | Status: DC | PRN

## 2022-06-05 MED ORDER — FLUTICASONE PROPIONATE HFA 110 MCG/ACT IN AERO: INHALATION_SPRAY | RESPIRATORY_TRACT | 1 refills | Status: DC

## 2022-06-05 NOTE — Patient Instructions (Addendum)
Heat (pad or rice pillow in microwave) over affected area, 10-15 minutes twice daily.   Ice/cold pack over area for 10-15 min twice daily.  OK to take Tylenol 1000 mg (2 extra strength tabs) or 975 mg (3 regular strength tabs) every 6 hours as needed.  Send me a message if the warmth in the leg happens again.   Asthma Action Plan There are 3 zones to consider: 1. Green Zone- This is the zone we hope to keep you in. Avoiding allergens and compliance with inhalers will keep you in this safe zone. No need to take anything extra while in this zone. 2. Yellow Zone- This zone is where we can save time, money and visits. You are not doing well enough to be considered in the green zone, but not bad enough to be in the red zone. This is where we need to remove any allergen or factor that is contributing to your breathing issues. You have a separate inhaler (inhaled steroid) to take twice daily in addition to your other inhalers. This should give you the push you need to get back to the green zone. Contact our office if you have any questions. 3. Red Zone- This is the zone where you should consider calling 911 vs going to the ER. Despite compliance with inhalers/meds (or maybe because we haven't been using them), our breathing isn't where we want it to be. Albuterol isn't helping as much either and you need medical care. This is the zone we try to avoid as much as possible!  Let us know if you need anything.  Stretching and range of motion exercises These exercises warm up your muscles and joints and improve the movement and flexibility of your lower leg. These exercises also help to relieve pain and stiffness.  Exercise A: Gastrocnemius stretch Sit with your left / right leg extended. Loop a belt or towel around the ball of your left / right foot. The ball of your foot is on the walking surface, right under your toes. Hold both ends of the belt or towel. Keep your left / right ankle and foot relaxed  and keep your knee straight while you use the belt or towel to pull your foot and ankle toward you. Stop at the first point of resistance. Hold this position for 30 seconds. Repeat 2 times. Complete this exercise 3 times per week.  Exercise B: Ankle alphabet Sit with your left / right leg supported at the lower leg. Do not rest your foot on anything. Make sure your foot has room to move freely. Think of your left / right foot as a paintbrush, and move your foot to trace each letter of the alphabet in the air. Keep your hip and knee still while you trace. Trace every letter from A to Z. Repeat 2 times. Complete this exercise 3 times per week.  Strengthening exercises These exercises build strength and endurance in your lower leg. Endurance is the ability to use your muscles for a long time, even after they get tired.  Exercise C: Plantar flexors with band Sit with your left / right leg extended. Loop a rubber exercise band or tube around the ball of your left / right foot. The ball of your foot is on the walking surface, right under your toes. While holding both ends of the band or tube, slowly point your toes downward, pushing them away from you. Hold this position for 3 seconds. Slowly return your foot to the starting position  and repeat for a total of 10 repetitions. Repeat 2 times. Complete this exercise 3 times per week.  Exercise D: Plantar flexors, standing Stand with your feet shoulder-width apart. Place your hands on a wall or table to steady yourself as needed, but try not to use it very much for support. Rise up on your toes. If this exercise is too easy, try these options: Shift your weight toward your left / right leg until you feel challenged. If told by your health care provider, stand on your left / right foot only. Hold this position for 3 seconds. Repeat for a total of 10 repetitions. Repeat 2 times. Complete this exercise 3 times per week.  Exercise E: Plantar  flexors, eccentric Stand on the balls of your feet on the edge of a step. The ball of your foot is on the walking surface, right under your toes. Place your hands on a wall or railing for balance as needed, but try not to lean on it for support. Rise up on your toes, using both legs to help. Slowly shift all of your weight to your left / right foot and lift your other foot off the step. Slowly lower your left / right heel so it drops below the level of the step. You will feel a slight stretch in your left / right calf. Put your other foot back onto the step. Repeat 2 times. Complete this exercise 3 times per week. This information is not intended to replace advice given to you by your health care provider. Make sure you discuss any questions you have with your health care provider.

## 2022-06-05 NOTE — Progress Notes (Signed)
Musculoskeletal Exam  Patient: Amy Reed DOB: 04/30/1986  DOS: 06/05/2022  SUBJECTIVE:  Chief Complaint:   Chief Complaint  Patient presents with   Follow-up    Flu last week Circulation in legs would like to be checked.   Cough    Congestion     Amy Reed is a 36 y.o.  female for evaluation and treatment of leg pain.   No inj or change in activity.  Location: Right Achilles pain with intermittent sharp pains for the past 18 years, right calf pain for the past year, had a warmth in left calf throughout the day while standing 5 days ago, no issues since Character:  aching  Progression of issue:  is unchanged Associated symptoms: No swelling, redness, bruising, varicosities, chest pain, or shortness of breath Treatment: to date has been none.   Neurovascular symptoms: no  Cough Duration: 1 week  Associated symptoms: sinus congestion, rhinorrhea, wheezing, shortness of breath, and coughing Denies: sinus pain, itchy watery eyes, ear pain, ear drainage, and fevers Treatment to date: SABA Sick contacts: No  Past Medical History:  Diagnosis Date   Allergy    Asthma    Family history of colon cancer    GERD (gastroesophageal reflux disease)     Objective: VITAL SIGNS: BP 110/76 (BP Location: Left Arm, Patient Position: Sitting, Cuff Size: Normal)   Pulse 71   Temp 98.2 F (36.8 C) (Oral)   Ht 5' 7.5" (1.715 m)   Wt 147 lb 2 oz (66.7 kg)   SpO2 99%   BMI 22.70 kg/m  Constitutional: Well formed, well developed. No acute distress. Heart: RRR Thorax & Lungs: CTAB. No accessory muscle use Musculoskeletal: Calves.   Tenderness to palpation: mild ttp over medial gastroc on R Mild ttp over calcaneal tendon.  Deformity: no Ecchymosis: no Tests positive: none Tests negative: Homan's b/l Neurologic: Normal sensory function. No focal deficits noted. Gait nml.  Psychiatric: Normal mood. Age appropriate judgment and insight. Alert & oriented x 3.     Assessment:  Right leg pain  Mild persistent asthma, unspecified whether complicated - Plan: albuterol (VENTOLIN HFA) 108 (90 Base) MCG/ACT inhaler, fluticasone (FLOVENT HFA) 110 MCG/ACT inhaler  Plan: Stretches/exercises, heat, ice, Tylenol.  Unlikely to be peripheral vascular disease given history being on suggestive of claudication.  The warmth of her left calf is interesting but unlikely to be anything outside of positional.  If applicable, we will obtain an ABI. Chronic, uncontrolled.  Continue albuterol as needed.  Add Flovent for as needed use during yellow zone.  Rinse mouth out after use. F/u as originally scheduled. The patient voiced understanding and agreement to the plan.   [place], DO 06/05/22  8:38 AM

## 2022-07-06 IMAGING — DX DG CHEST 2V
2 series · 2 of 2 positions shown · non-contrast
Comparison: None.

CLINICAL DATA: Chest pain

EXAM:
CHEST - 2 VIEW

[chest lat]
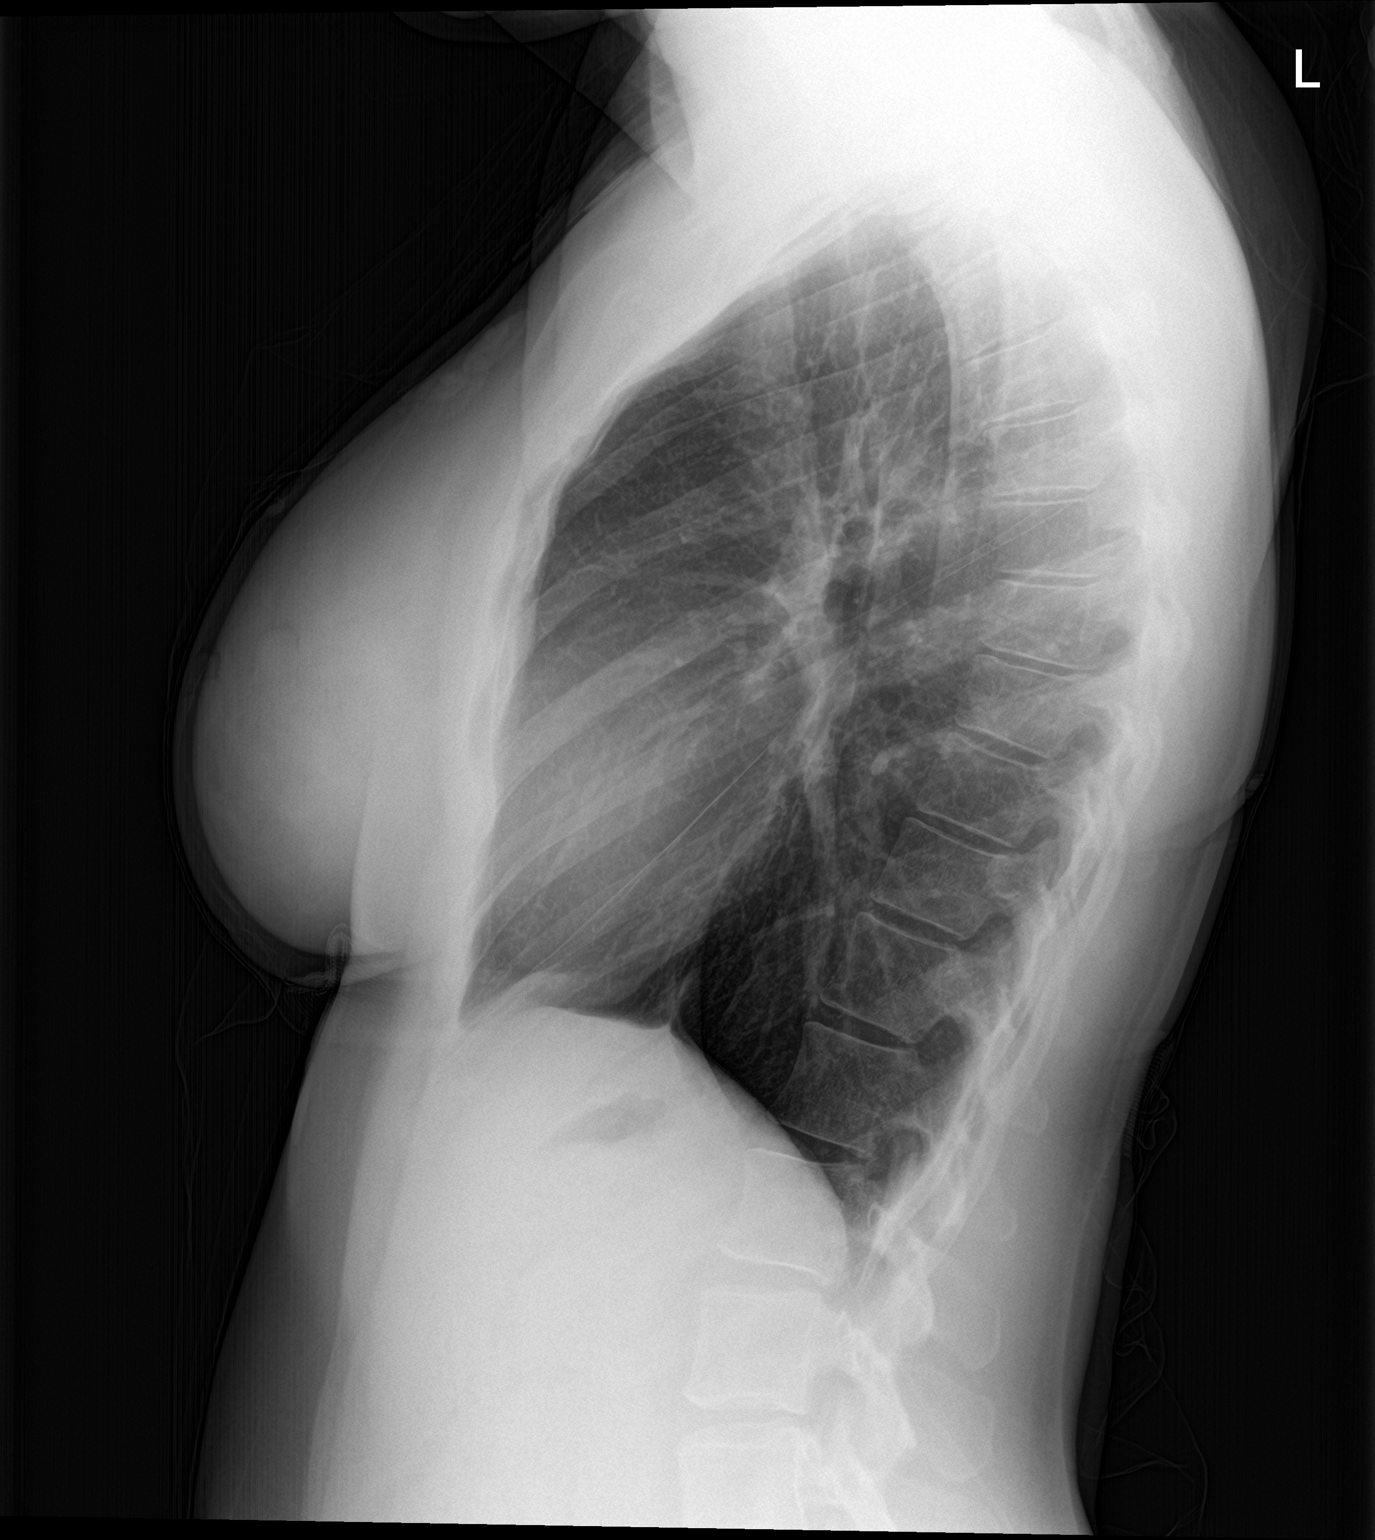

[chest pa]
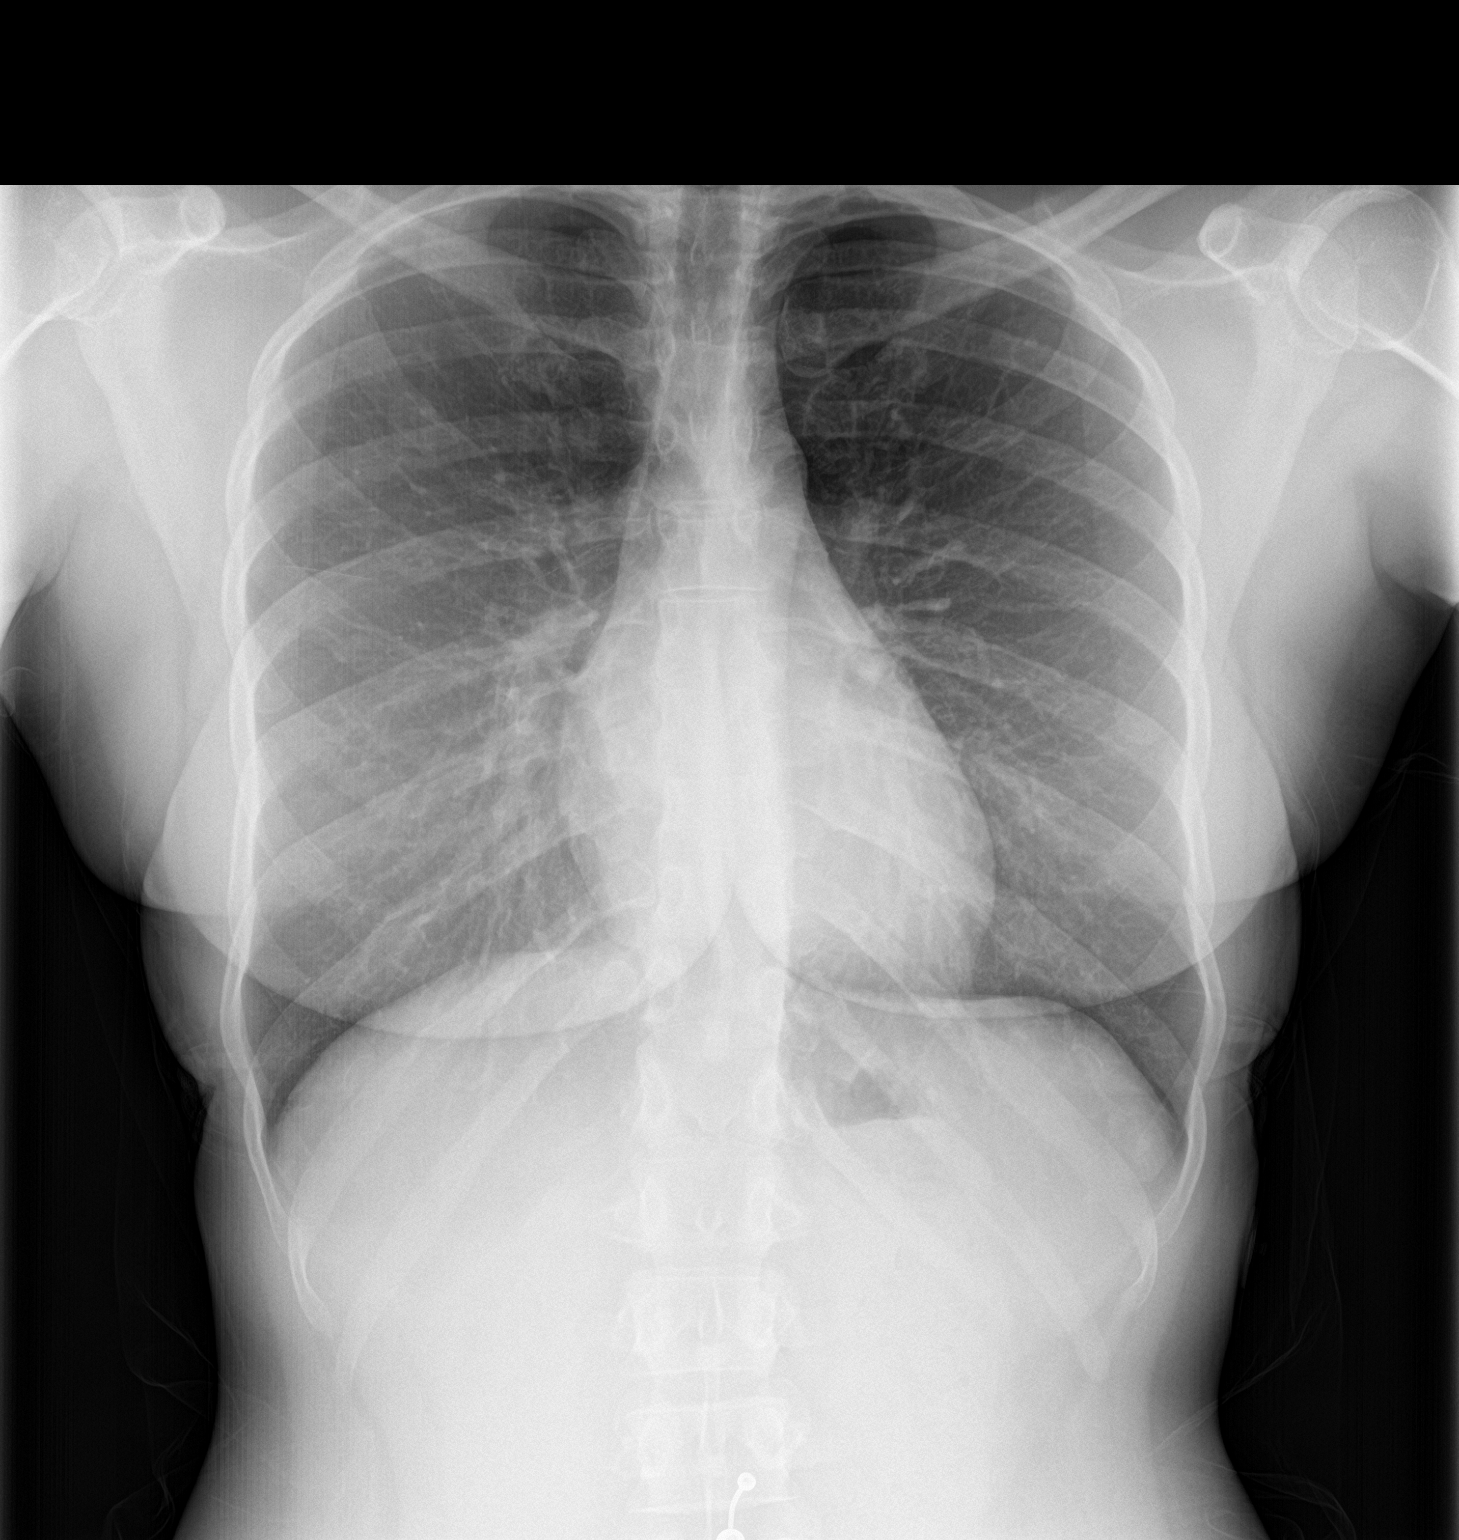

[2 of 2 positions shown; findings below may reference images not displayed]

FINDINGS: The heart size and mediastinal contours are within normal limits.
Both lungs are clear. The visualized skeletal structures are
unremarkable.
IMPRESSION: No active cardiopulmonary disease.

## 2022-10-02 ENCOUNTER — Other Ambulatory Visit (HOSPITAL_COMMUNITY)
Admission: RE | Admit: 2022-10-02 | Discharge: 2022-10-02 | Disposition: A | Discharge status: Home / Self Care | Source: Ambulatory Visit | Attending: Obstetrics & Gynecology | Admitting: Obstetrics & Gynecology

## 2022-10-02 ENCOUNTER — Encounter: Payer: Self-pay | Admitting: Obstetrics & Gynecology

## 2022-10-02 ENCOUNTER — Ambulatory Visit (INDEPENDENT_AMBULATORY_CARE_PROVIDER_SITE_OTHER): Admitting: Obstetrics & Gynecology

## 2022-10-02 VITALS — BP 125/80 | HR 74 | Ht 68.0 in | Wt 144.0 lb

## 2022-10-02 DIAGNOSIS — Z01419 Encounter for gynecological examination (general) (routine) without abnormal findings: Secondary | ICD-10-CM | POA: Insufficient documentation

## 2022-10-02 DIAGNOSIS — Z113 Encounter for screening for infections with a predominantly sexual mode of transmission: Secondary | ICD-10-CM

## 2022-10-02 DIAGNOSIS — N898 Other specified noninflammatory disorders of vagina: Secondary | ICD-10-CM

## 2022-10-02 MED ORDER — TERCONAZOLE 0.4 % VA CREA: 1.0000 | TOPICAL_CREAM | Freq: Every day | VAGINAL | 0 refills | Status: DC

## 2022-10-02 MED ORDER — CLEOCIN 100 MG VA SUPP: 100.0000 mg | Freq: Every day | VAGINAL | 0 refills | Status: DC

## 2022-10-02 NOTE — Progress Notes (Signed)
Patient ID: Amy Reed, female   DOB: 1986/03/23, 37 y.o.   MRN: [id number]  Chief Complaint  Patient presents with   Gynecologic Exam    HPI Amy Reed is a 37 y.o. female.  [id number] Patient's last menstrual period was 09/26/2022 (exact date). She needs no contraceptive. She want STD screen and vaginal treatment if she has recurrent BV or yeast HPI  Past Medical History:  Diagnosis Date   Allergy    Asthma    Family history of colon cancer    GERD (gastroesophageal reflux disease)     Past Surgical History:  Procedure Laterality Date   40 HOUR [place] STUDY N/A 10/10/2017   Procedure: 27 HOUR PH STUDY-OFF of PPI;  Surgeon: Mauri Pole, MD;  Location: [facility];  Service: Endoscopy;  Laterality: N/A;   DILATION AND CURETTAGE OF UTERUS     ESOPHAGEAL MANOMETRY N/A 10/10/2017   Procedure: ESOPHAGEAL MANOMETRY (EM);  Surgeon: Mauri Pole, MD;  Location: [facility];  Service: Endoscopy;  Laterality: N/A;   [place] IMPEDANCE STUDY N/A 10/10/2017   Procedure: PH IMPEDANCE STUDY-OFF of PPI;  Surgeon: Mauri Pole, MD;  Location: [facility];  Service: Endoscopy;  Laterality: N/A;    Family History  Problem Relation Age of Onset   Colon cancer Father 61   Colon polyps Sister    Cervical polyp Sister    Colon polyps Brother    Diabetes Maternal Grandmother    Diabetes Maternal Grandfather    Diabetes Paternal Grandmother    Cervical cancer Paternal Grandmother    Diabetes Paternal Grandfather    Colon cancer Paternal Grandfather    Colon cancer Paternal Aunt    Colon cancer Paternal Aunt    Esophageal cancer Neg Hx    Stomach cancer Neg Hx    Rectal cancer Neg Hx     Social History Social History   Tobacco Use   Smoking status: Never   Smokeless tobacco: Never  Vaping Use   Vaping Use: Never used  Substance Use Topics   Alcohol use: Yes    Comment: 2-3 times a month.    Drug use: No    Allergies  Allergen Reactions   Shellfish Allergy  Anaphylaxis   Latex Itching    Current Outpatient Medications  Medication Sig Dispense Refill   clindamycin (CLEOCIN) 100 MG vaginal suppository Place 1 suppository (100 mg total) vaginally at bedtime. 3 suppository 0   terconazole (TERAZOL 7) 0.4 % vaginal cream Place 1 applicator vaginally at bedtime. 45 g 0   albuterol (VENTOLIN HFA) 108 (90 Base) MCG/ACT inhaler Inhale 2 puffs into the lungs every 6 (six) hours as needed for wheezing or shortness of breath. 18 g 2   EPINEPHrine (EPIPEN 2-PAK) 0.3 mg/0.3 mL IJ SOAJ injection Inject 0.3 mg into the muscle as needed for anaphylaxis. 1 each 2   fluticasone (FLOVENT HFA) 110 MCG/ACT inhaler Rinse mouth out after use. Take 2 puffs twice daily when in yellow zone. 1 each 1   levocetirizine (XYZAL) 5 MG tablet Take 1 tablet (5 mg total) by mouth every evening. 30 tablet 2   montelukast (SINGULAIR) 10 MG tablet Take 1 tablet (10 mg total) by mouth at bedtime. 30 tablet 3   No current facility-administered medications for this visit.    Review of Systems Review of Systems  Constitutional: Negative.   HENT: Negative.    Respiratory: Negative.    Cardiovascular: Negative.   Gastrointestinal: Negative.   Genitourinary:  Positive  for menstrual problem (some cramps) and vaginal discharge. Negative for vaginal bleeding.    Blood pressure 125/80, pulse 74, height 5\' 8"  (1.727 m), weight 144 lb (65.3 kg), last menstrual period 09/26/2022.  Physical Exam Physical Exam Vitals and nursing note reviewed. Exam conducted with a chaperone present.  Constitutional:      Appearance: Normal appearance.  HENT:     Head: Normocephalic and atraumatic.  Cardiovascular:     Rate and Rhythm: Normal rate.  Pulmonary:     Effort: Pulmonary effort is normal.  Chest:  Breasts:    Breasts are symmetrical.     Right: Normal.     Left: Normal. Swelling: bilateral nipple piercings.  Genitourinary:    General: Normal vulva.     Exam position: Lithotomy  position.     Vagina: Normal.     Cervix: Normal.     Uterus: Normal.      Adnexa: Right adnexa normal and left adnexa normal.  Musculoskeletal:     Cervical back: Normal range of motion.  Skin:    General: Skin is warm and dry.  Neurological:     Mental Status: She is alert.     Data Reviewed Pap normal 2021  Assessment Well woman exam with routine gynecological exam - Plan: Cervicovaginal ancillary only( [place]), Cytology - PAP( [place]), HIV antibody (with reflex), Hepatitis C Antibody, Hepatitis B Surface AntiGEN, RPR, terconazole (TERAZOL 7) 0.4 % vaginal cream, clindamycin (CLEOCIN) 100 MG vaginal suppository  Screen for STD (sexually transmitted disease)  Vaginal discharge - Plan: terconazole (TERAZOL 7) 0.4 % vaginal cream, clindamycin (CLEOCIN) 100 MG vaginal suppository   Plan Screening mammogram due to family history.     Emeterio Reeve 10/02/2022, 8:57 AM

## 2022-10-02 NOTE — Progress Notes (Signed)
37 y.o GYN presents for AEX/PAP/STD Screening.  C/o recurrent BV and Yeast and not being able to tolerate the Flagyl.

## 2022-10-03 LAB — CERVICOVAGINAL ANCILLARY ONLY
Bacterial Vaginitis (gardnerella): POSITIVE — AB
Chlamydia: NEGATIVE
Comment: NEGATIVE
Comment: NEGATIVE
Comment: NEGATIVE
Comment: NORMAL
Neisseria Gonorrhea: NEGATIVE
Trichomonas: NEGATIVE

## 2022-10-03 LAB — HEPATITIS B SURFACE ANTIGEN: Hepatitis B Surface Ag: NEGATIVE

## 2022-10-03 LAB — HIV ANTIBODY (ROUTINE TESTING W REFLEX): HIV Screen 4th Generation wRfx: NONREACTIVE

## 2022-10-03 LAB — RPR: RPR Ser Ql: NONREACTIVE

## 2022-10-03 LAB — HEPATITIS C ANTIBODY: Hep C Virus Ab: NONREACTIVE

## 2022-10-06 LAB — CYTOLOGY - PAP
Comment: NEGATIVE
Diagnosis: UNDETERMINED — AB
High risk HPV: NEGATIVE

## 2022-10-12 ENCOUNTER — Other Ambulatory Visit: Payer: Self-pay | Admitting: Obstetrics & Gynecology

## 2022-10-12 DIAGNOSIS — N898 Other specified noninflammatory disorders of vagina: Secondary | ICD-10-CM

## 2022-10-12 DIAGNOSIS — Z01419 Encounter for gynecological examination (general) (routine) without abnormal findings: Secondary | ICD-10-CM

## 2022-10-13 ENCOUNTER — Other Ambulatory Visit: Payer: Self-pay

## 2022-10-13 DIAGNOSIS — B9689 Other specified bacterial agents as the cause of diseases classified elsewhere: Secondary | ICD-10-CM

## 2022-10-13 MED ORDER — METRONIDAZOLE 0.75 % VA GEL: 1.0000 | Freq: Every day | VAGINAL | 1 refills | Status: DC

## 2022-11-10 ENCOUNTER — Ambulatory Visit: Admitting: Family Medicine

## 2022-11-13 ENCOUNTER — Ambulatory Visit: Admitting: Obstetrics & Gynecology

## 2022-11-13 ENCOUNTER — Encounter: Payer: Self-pay | Admitting: Family Medicine

## 2022-11-13 ENCOUNTER — Ambulatory Visit (INDEPENDENT_AMBULATORY_CARE_PROVIDER_SITE_OTHER): Admitting: Family Medicine

## 2022-11-13 VITALS — BP 110/62 | HR 86 | Temp 98.2°F | Ht 67.0 in | Wt 152.1 lb

## 2022-11-13 DIAGNOSIS — R0683 Snoring: Secondary | ICD-10-CM | POA: Diagnosis not present

## 2022-11-13 DIAGNOSIS — M545 Low back pain, unspecified: Secondary | ICD-10-CM | POA: Diagnosis not present

## 2022-11-13 MED ORDER — FLUCONAZOLE 150 MG PO TABS: ORAL_TABLET | ORAL | 0 refills | Status: DC

## 2022-11-13 MED ORDER — TIZANIDINE HCL 4 MG PO TABS: 4.0000 mg | ORAL_TABLET | Freq: Four times a day (QID) | ORAL | 0 refills | Status: DC | PRN

## 2022-11-13 NOTE — Progress Notes (Signed)
Musculoskeletal Exam  Patient: Amy Reed DOB: 06-17-1986  DOS: 11/13/2022  SUBJECTIVE:  Chief Complaint:   Chief Complaint  Patient presents with   Back Pain    Amy Reed is a 37 y.o.  female for evaluation and treatment of back pain.   Onset:  1 week ago.  Probably from carrying boxes of items.  Location: lower mid Character:  shooting  Progression of issue:  is unchanged Associated symptoms: no bruising, redness, swelling Denies bowel/bladder incontinence or weakness Treatment: to date has been rest and Tylenol.   Neurovascular symptoms: no  Past Medical History:  Diagnosis Date   Allergy    Asthma    Family history of colon cancer    GERD (gastroesophageal reflux disease)     Objective:  VITAL SIGNS: BP 110/62 (BP Location: Left Arm, Patient Position: Sitting, Cuff Size: Normal)   Pulse 86   Temp 98.2 F (36.8 C) (Oral)   Ht '5\' 7"'$  (1.702 m)   Wt 152 lb 2 oz (69 kg)   SpO2 99%   BMI 23.83 kg/m  Constitutional: Well formed, well developed. No acute distress. HENT: Normocephalic, atraumatic.  Thorax & Lungs:  No accessory muscle use Musculoskeletal: low back.   Tenderness to palpation: no Deformity: no Ecchymosis: no Straight leg test: negative for Poor hamstring flexibility b/l. Neurologic: Normal sensory function. No focal deficits noted. DTR's equal and symmetric in LE's. No clonus. Gait nml.  Psychiatric: Normal mood. Age appropriate judgment and insight. Alert & oriented x 3.    Assessment:  Acute bilateral low back pain without sciatica - Plan: tiZANidine (ZANAFLEX) 4 MG tablet  Snoring - Plan: Ambulatory referral to Neurology  Plan: 1. Zanaflex prn. Warned about potential drowsiness. Stretches/exercises, heat, ice, Tylenol, NSAIDs. 2. Refer to neuro.  F/u prn. The patient voiced understanding and agreement to the plan.   [place], DO 11/13/22  11:46 AM

## 2022-11-13 NOTE — Patient Instructions (Addendum)
Please contact the GI team at: [phone]  Consider a vaginal probiotic (RepHresh) to help with recurrent infections.   Take muscle relaxer 1-2 hours before planned bedtime. If it makes you drowsy, do not take during the day. You can try half a tab the following night.  OK to take Tylenol 1000 mg (2 extra strength tabs) or 975 mg (3 regular strength tabs) every 6 hours as needed.  Ibuprofen 400-600 mg (2-3 over the counter strength tabs) every 6 hours as needed for pain.  Let us know if you need anything.  EXERCISES  RANGE OF MOTION (ROM) AND STRETCHING EXERCISES - Low Back Pain Most people with lower back pain will find that their symptoms get worse with excessive bending forward (flexion) or arching at the lower back (extension). The exercises that will help resolve your symptoms will focus on the opposite motion.  If you have pain, numbness or tingling which travels down into your buttocks, leg or foot, the goal of the therapy is for these symptoms to move closer to your back and eventually resolve. Sometimes, these leg symptoms will get better, but your lower back pain may worsen. This is often an indication of progress in your rehabilitation. Be very alert to any changes in your symptoms and the activities in which you participated in the 24 hours prior to the change. Sharing this information with your caregiver will allow him or her to most efficiently treat your condition. These exercises may help you when beginning to rehabilitate your injury. Your symptoms may resolve with or without further involvement from your physician, physical therapist or athletic trainer. While completing these exercises, remember:  Restoring tissue flexibility helps normal motion to return to the joints. This allows healthier, less painful movement and activity. An effective stretch should be held for at least 30 seconds. A stretch should never be painful. You should only feel a gentle lengthening or release in  the stretched tissue. FLEXION RANGE OF MOTION AND STRETCHING EXERCISES:  STRETCH - Flexion, Single Knee to Chest  Lie on a firm bed or floor with both legs extended in front of you. Keeping one leg in contact with the floor, bring your opposite knee to your chest. Hold your leg in place by either grabbing behind your thigh or at your knee. Pull until you feel a gentle stretch in your low back. Hold 30 seconds. Slowly release your grasp and repeat the exercise with the opposite side. Repeat 2 times. Complete this exercise 3 times per week.   STRETCH - Flexion, Double Knee to Chest Lie on a firm bed or floor with both legs extended in front of you. Keeping one leg in contact with the floor, bring your opposite knee to your chest. Tense your stomach muscles to support your back and then lift your other knee to your chest. Hold your legs in place by either grabbing behind your thighs or at your knees. Pull both knees toward your chest until you feel a gentle stretch in your low back. Hold 30 seconds. Tense your stomach muscles and slowly return one leg at a time to the floor. Repeat 2 times. Complete this exercise 3 times per week.   STRETCH - Low Trunk Rotation Lie on a firm bed or floor. Keeping your legs in front of you, bend your knees so they are both pointed toward the ceiling and your feet are flat on the floor. Extend your arms out to the side. This will stabilize your upper body by keeping  your shoulders in contact with the floor. Gently and slowly drop both knees together to one side until you feel a gentle stretch in your low back. Hold for 30 seconds. Tense your stomach muscles to support your lower back as you bring your knees back to the starting position. Repeat the exercise to the other side. Repeat 2 times. Complete this exercise at least 3 times per week.   EXTENSION RANGE OF MOTION AND FLEXIBILITY EXERCISES:  STRETCH - Extension, Prone on Elbows  Lie on your stomach on the  floor, a bed will be too soft. Place your palms about shoulder width apart and at the height of your head. Place your elbows under your shoulders. If this is too painful, stack pillows under your chest. Allow your body to relax so that your hips drop lower and make contact more completely with the floor. Hold this position for 30 seconds. Slowly return to lying flat on the floor. Repeat 2 times. Complete this exercise 3 times per week.   RANGE OF MOTION - Extension, Prone Press Ups Lie on your stomach on the floor, a bed will be too soft. Place your palms about shoulder width apart and at the height of your head. Keeping your back as relaxed as possible, slowly straighten your elbows while keeping your hips on the floor. You may adjust the placement of your hands to maximize your comfort. As you gain motion, your hands will come more underneath your shoulders. Hold this position 30 seconds. Slowly return to lying flat on the floor. Repeat 2 times. Complete this exercise 3 times per week.   RANGE OF MOTION- Quadruped, Neutral Spine  Assume a hands and knees position on a firm surface. Keep your hands under your shoulders and your knees under your hips. You may place padding under your knees for comfort. Drop your head and point your tailbone toward the ground below you. This will round out your lower back like an angry cat. Hold this position for 30 seconds. Slowly lift your head and release your tail bone so that your back sags into a large arch, like an old horse. Hold this position for 30 seconds. Repeat this until you feel limber in your low back. Now, find your "sweet spot." This will be the most comfortable position somewhere between the two previous positions. This is your neutral spine. Once you have found this position, tense your stomach muscles to support your low back. Hold this position for 30 seconds. Repeat 2 times. Complete this exercise 3 times per week.   STRENGTHENING  EXERCISES - Low Back Sprain These exercises may help you when beginning to rehabilitate your injury. These exercises should be done near your "sweet spot." This is the neutral, low-back arch, somewhere between fully rounded and fully arched, that is your least painful position. When performed in this safe range of motion, these exercises can be used for people who have either a flexion or extension based injury. These exercises may resolve your symptoms with or without further involvement from your physician, physical therapist or athletic trainer. While completing these exercises, remember:  Muscles can gain both the endurance and the strength needed for everyday activities through controlled exercises. Complete these exercises as instructed by your physician, physical therapist or athletic trainer. Increase the resistance and repetitions only as guided. You may experience muscle soreness or fatigue, but the pain or discomfort you are trying to eliminate should never worsen during these exercises. If this pain does worsen, stop and  make certain you are following the directions exactly. If the pain is still present after adjustments, discontinue the exercise until you can discuss the trouble with your caregiver.  STRENGTHENING - Deep Abdominals, Pelvic Tilt  Lie on a firm bed or floor. Keeping your legs in front of you, bend your knees so they are both pointed toward the ceiling and your feet are flat on the floor. Tense your lower abdominal muscles to press your low back into the floor. This motion will rotate your pelvis so that your tail bone is scooping upwards rather than pointing at your feet or into the floor. With a gentle tension and even breathing, hold this position for 3 seconds. Repeat 2 times. Complete this exercise 3 times per week.   STRENGTHENING - Abdominals, Crunches  Lie on a firm bed or floor. Keeping your legs in front of you, bend your knees so they are both pointed toward the  ceiling and your feet are flat on the floor. Cross your arms over your chest. Slightly tip your chin down without bending your neck. Tense your abdominals and slowly lift your trunk high enough to just clear your shoulder blades. Lifting higher can put excessive stress on the lower back and does not further strengthen your abdominal muscles. Control your return to the starting position. Repeat 2 times. Complete this exercise 3 times per week.   STRENGTHENING - Quadruped, Opposite UE/LE Lift  Assume a hands and knees position on a firm surface. Keep your hands under your shoulders and your knees under your hips. You may place padding under your knees for comfort. Find your neutral spine and gently tense your abdominal muscles so that you can maintain this position. Your shoulders and hips should form a rectangle that is parallel with the floor and is not twisted. Keeping your trunk steady, lift your right hand no higher than your shoulder and then your left leg no higher than your hip. Make sure you are not holding your breath. Hold this position for 30 seconds. Continuing to keep your abdominal muscles tense and your back steady, slowly return to your starting position. Repeat with the opposite arm and leg. Repeat 2 times. Complete this exercise 3 times per week.   STRENGTHENING - Abdominals and Quadriceps, Straight Leg Raise  Lie on a firm bed or floor with both legs extended in front of you. Keeping one leg in contact with the floor, bend the other knee so that your foot can rest flat on the floor. Find your neutral spine, and tense your abdominal muscles to maintain your spinal position throughout the exercise. Slowly lift your straight leg off the floor about 6 inches for a count of 3, making sure to not hold your breath. Still keeping your neutral spine, slowly lower your leg all the way to the floor. Repeat this exercise with each leg 2 times. Complete this exercise 3 times per  week.  POSTURE AND BODY MECHANICS CONSIDERATIONS - Low Back Sprain Keeping correct posture when sitting, standing or completing your activities will reduce the stress put on different body tissues, allowing injured tissues a chance to heal and limiting painful experiences. The following are general guidelines for improved posture.  While reading these guidelines, remember: The exercises prescribed by your provider will help you have the flexibility and strength to maintain correct postures. The correct posture provides the best environment for your joints to work. All of your joints have less wear and tear when properly supported by a spine  with good posture. This means you will experience a healthier, less painful body. Correct posture must be practiced with all of your activities, especially prolonged sitting and standing. Correct posture is as important when doing repetitive low-stress activities (typing) as it is when doing a single heavy-load activity (lifting).  RESTING POSITIONS Consider which positions are most painful for you when choosing a resting position. If you have pain with flexion-based activities (sitting, bending, stooping, squatting), choose a position that allows you to rest in a less flexed posture. You would want to avoid curling into a fetal position on your side. If your pain worsens with extension-based activities (prolonged standing, working overhead), avoid resting in an extended position such as sleeping on your stomach. Most people will find more comfort when they rest with their spine in a more neutral position, neither too rounded nor too arched. Lying on a non-sagging bed on your side with a pillow between your knees, or on your back with a pillow under your knees will often provide some relief. Keep in mind, being in any one position for a prolonged period of time, no matter how correct your posture, can still lead to stiffness.  PROPER SITTING POSTURE In order to  minimize stress and discomfort on your spine, you must sit with correct posture. Sitting with good posture should be effortless for a healthy body. Returning to good posture is a gradual process. Many people can work toward this most comfortably by using various supports until they have the flexibility and strength to maintain this posture on their own. When sitting with proper posture, your ears will fall over your shoulders and your shoulders will fall over your hips. You should use the back of the chair to support your upper back. Your lower back will be in a neutral position, just slightly arched. You may place a small pillow or folded towel at the base of your lower back for  support.  When working at a desk, create an environment that supports good, upright posture. Without extra support, muscles tire, which leads to excessive strain on joints and other tissues. Keep these recommendations in mind:  CHAIR: A chair should be able to slide under your desk when your back makes contact with the back of the chair. This allows you to work closely. The chair's height should allow your eyes to be level with the upper part of your monitor and your hands to be slightly lower than your elbows.  BODY POSITION Your feet should make contact with the floor. If this is not possible, use a foot rest. Keep your ears over your shoulders. This will reduce stress on your neck and low back.  INCORRECT SITTING POSTURES  If you are feeling tired and unable to assume a healthy sitting posture, do not slouch or slump. This puts excessive strain on your back tissues, causing more damage and pain. Healthier options include: Using more support, like a lumbar pillow. Switching tasks to something that requires you to be upright or walking. Talking a brief walk. Lying down to rest in a neutral-spine position.  PROLONGED STANDING WHILE SLIGHTLY LEANING FORWARD  When completing a task that requires you to lean forward while  standing in one place for a long time, place either foot up on a stationary 2-4 inch high object to help maintain the best posture. When both feet are on the ground, the lower back tends to lose its slight inward curve. If this curve flattens (or becomes too large), then the  back and your other joints will experience too much stress, tire more quickly, and can cause pain.  CORRECT STANDING POSTURES Proper standing posture should be assumed with all daily activities, even if they only take a few moments, like when brushing your teeth. As in sitting, your ears should fall over your shoulders and your shoulders should fall over your hips. You should keep a slight tension in your abdominal muscles to brace your spine. Your tailbone should point down to the ground, not behind your body, resulting in an over-extended swayback posture.   INCORRECT STANDING POSTURES  Common incorrect standing postures include a forward head, locked knees and/or an excessive swayback. WALKING Walk with an upright posture. Your ears, shoulders and hips should all line-up.  PROLONGED ACTIVITY IN A FLEXED POSITION When completing a task that requires you to bend forward at your waist or lean over a low surface, try to find a way to stabilize 3 out of 4 of your limbs. You can place a hand or elbow on your thigh or rest a knee on the surface you are reaching across. This will provide you more stability, so that your muscles do not tire as quickly. By keeping your knees relaxed, or slightly bent, you will also reduce stress across your lower back. CORRECT LIFTING TECHNIQUES  DO : Assume a wide stance. This will provide you more stability and the opportunity to get as close as possible to the object which you are lifting. Tense your abdominals to brace your spine. Bend at the knees and hips. Keeping your back locked in a neutral-spine position, lift using your leg muscles. Lift with your legs, keeping your back straight. Test the  weight of unknown objects before attempting to lift them. Try to keep your elbows locked down at your sides in order get the best strength from your shoulders when carrying an object.   Always ask for help when lifting heavy or awkward objects. INCORRECT LIFTING TECHNIQUES DO NOT:  Lock your knees when lifting, even if it is a small object. Bend and twist. Pivot at your feet or move your feet when needing to change directions. Assume that you can safely pick up even a paperclip without proper posture.

## 2022-11-24 ENCOUNTER — Other Ambulatory Visit: Payer: Self-pay | Admitting: Family Medicine

## 2022-11-24 DIAGNOSIS — M545 Low back pain, unspecified: Secondary | ICD-10-CM

## 2022-12-02 ENCOUNTER — Other Ambulatory Visit: Payer: Self-pay | Admitting: Family Medicine

## 2022-12-02 DIAGNOSIS — M545 Low back pain, unspecified: Secondary | ICD-10-CM

## 2023-03-27 ENCOUNTER — Other Ambulatory Visit (HOSPITAL_COMMUNITY)
Admission: RE | Admit: 2023-03-27 | Discharge: 2023-03-27 | Disposition: A | Discharge status: Home / Self Care | Source: Ambulatory Visit | Attending: Obstetrics and Gynecology | Admitting: Obstetrics and Gynecology

## 2023-03-27 ENCOUNTER — Ambulatory Visit (INDEPENDENT_AMBULATORY_CARE_PROVIDER_SITE_OTHER): Admitting: *Deleted

## 2023-03-27 VITALS — BP 123/78 | HR 85

## 2023-03-27 DIAGNOSIS — B9689 Other specified bacterial agents as the cause of diseases classified elsewhere: Secondary | ICD-10-CM

## 2023-03-27 DIAGNOSIS — N898 Other specified noninflammatory disorders of vagina: Secondary | ICD-10-CM

## 2023-03-27 DIAGNOSIS — B3731 Acute candidiasis of vulva and vagina: Secondary | ICD-10-CM

## 2023-03-27 MED ORDER — FLUCONAZOLE 150 MG PO TABS: ORAL_TABLET | ORAL | 0 refills | Status: DC

## 2023-03-27 MED ORDER — METRONIDAZOLE 0.75 % VA GEL: 1.0000 | Freq: Every day | VAGINAL | 1 refills | Status: DC

## 2023-03-27 NOTE — Progress Notes (Signed)
SUBJECTIVE:  37 y.o. female complains of clear vaginal discharge for 1 week(s). Denies abnormal vaginal bleeding or significant pelvic pain or fever. No UTI symptoms. Denies history of known exposure to STD.  Patient's last menstrual period was 03/14/2023 (approximate).  OBJECTIVE:  She appears well, afebrile. Urine dipstick: not done.  ASSESSMENT:  Vaginal Discharge  Vaginal Odor   PLAN:  GC, chlamydia, trichomonas, BVAG, CVAG probe sent to lab. Treatment: To be determined once lab results are received ROV prn if symptoms persist or worsen.  Pt with recurrent BV following menses. Once BV is treated, vaginal yeast infections occur. RX sent for BV and yeast. Pt has follow up appt with Dr. Landry Corporal to discuss recurrent BV following menses.

## 2023-03-29 LAB — CERVICOVAGINAL ANCILLARY ONLY
Bacterial Vaginitis (gardnerella): POSITIVE — AB
Candida Glabrata: NEGATIVE
Chlamydia: NEGATIVE
Comment: NEGATIVE
Comment: NEGATIVE
Comment: NEGATIVE
Comment: NEGATIVE
Comment: NORMAL
Neisseria Gonorrhea: NEGATIVE
Trichomonas: NEGATIVE

## 2023-03-29 MED ORDER — METRONIDAZOLE 500 MG PO TABS: 500.0000 mg | ORAL_TABLET | Freq: Two times a day (BID) | ORAL | 0 refills | Status: DC

## 2023-03-29 MED ORDER — FLUCONAZOLE 150 MG PO TABS: 150.0000 mg | ORAL_TABLET | Freq: Once | ORAL | 0 refills | Status: AC

## 2023-03-29 NOTE — Addendum Note (Signed)
Addended by: Catalina Antigua on: 03/29/2023 01:30 PM   Modules accepted: Orders

## 2023-04-12 ENCOUNTER — Ambulatory Visit: Admitting: Family Medicine

## 2023-04-12 ENCOUNTER — Other Ambulatory Visit (HOSPITAL_COMMUNITY): Admission: RE | Admit: 2023-04-12 | Source: Ambulatory Visit

## 2023-04-12 ENCOUNTER — Ambulatory Visit (INDEPENDENT_AMBULATORY_CARE_PROVIDER_SITE_OTHER): Admitting: Family Medicine

## 2023-04-12 VITALS — BP 141/89 | Wt 143.6 lb

## 2023-04-12 DIAGNOSIS — N898 Other specified noninflammatory disorders of vagina: Secondary | ICD-10-CM | POA: Insufficient documentation

## 2023-04-12 NOTE — Progress Notes (Signed)
Pt is in the office for GYN visit Reports recurrent bv around every menstrual, pt states that she was recently tested and treated on 03/27/23. Pt is on cycle now, LMP 04/10/23. Pt states that she has tried everything that she knows to do, including boric acid, unscented soaps, etc.

## 2023-04-12 NOTE — Progress Notes (Signed)
GYNECOLOGY OFFICE VISIT NOTE  History:   Amy Reed is a 37 y.o. [phone] here today for vaginal irritation. She reports recurrent BV and yeast infections in the last 3 years. Most recently diagnosed about 2 weeks ago, and treated with both PO and vaginal metronidazole and PO fluconazole. Symptoms improved for some time, and now are back again. Usually triggered by her menstrual period. She has female sexual partners. No practices she can think of that may be contributing. Drinks juice and gatoraid daily. Not much soda. She denies any abnormal vaginal discharge, bleeding, pelvic pain or other concerns.    Past Medical History:  Diagnosis Date   Allergy    Asthma    Family history of colon cancer    GERD (gastroesophageal reflux disease)     Past Surgical History:  Procedure Laterality Date   68 HOUR PH STUDY N/A 10/10/2017   Procedure: 24 HOUR PH STUDY-OFF of PPI;  Surgeon: Napoleon Form, MD;  Location: [facility];  Service: Endoscopy;  Laterality: N/A;   DILATION AND CURETTAGE OF UTERUS     ESOPHAGEAL MANOMETRY N/A 10/10/2017   Procedure: ESOPHAGEAL MANOMETRY (EM);  Surgeon: Napoleon Form, MD;  Location: [facility];  Service: Endoscopy;  Laterality: N/A;   PH IMPEDANCE STUDY N/A 10/10/2017   Procedure: PH IMPEDANCE STUDY-OFF of PPI;  Surgeon: Napoleon Form, MD;  Location: [facility];  Service: Endoscopy;  Laterality: N/A;    The following portions of the patient's history were reviewed and updated as appropriate: allergies, current medications, past family history, past medical history, past social history, past surgical history and problem list.   Health Maintenance:  ASCuS and negative HRHPV on 09/2021.   Review of Systems:  Pertinent items noted in HPI and remainder of comprehensive ROS otherwise negative.  Physical Exam:  BP (!) 141/89   Wt 143 lb 9.6 oz (65.1 kg)   LMP 04/10/2023 (Approximate)   BMI 22.49 kg/m  CONSTITUTIONAL: Well-developed,  well-nourished female in no acute distress.  HEENT:  Normocephalic, atraumatic. External right and left ear normal. No scleral icterus.  NECK: Normal range of motion, supple, no masses noted on observation SKIN: No rash noted. Not diaphoretic. No erythema. No pallor. MUSCULOSKELETAL: Normal range of motion. No edema noted. NEUROLOGIC: Alert and oriented to person, place, and time. Normal muscle tone coordination. No cranial nerve deficit noted. PSYCHIATRIC: Normal mood and affect. Normal behavior. Normal judgment and thought content. CARDIOVASCULAR: Normal heart rate noted RESPIRATORY: Effort and breath sounds normal, no problems with respiration noted ABDOMEN: No masses noted. No other overt distention noted.   PELVIC: Normal appearing external genitalia; normal urethral meatus; normal appearing vaginal mucosa. Some bleeding noted, No abnormal discharge noted. Performed in the presence of a chaperone  Labs and Imaging No results found for this or any previous visit (from the past 168 hour(s)). No results found.    Assessment and Plan:  Vaginal irritation - Plan: Mycoplasma / ureaplasma culture, Cervicovaginal ancillary only( [place]), Candida 6 Species Profile, NAA  Concern for recurrent BV and yeast infection of the last few years.  New swab collected today.  She has tried both clindamycin and metronidazole-p.o. and vaginal for treatments, however would provide only temporary relief.  Has tried boric acid in the past, with minimal benefit.  Vaginal swab samples collected today to assess for new vaginitis, none albicans Candida as well as Ureaplasma urealyticum infection. -Will treat based on results.  -Recommended cutting back on sugar in her diet and  looking out for any sexual practices that may be triggering infection.  Routine preventative health maintenance measures emphasized. Please refer to After Visit Summary for other counseling recommendations.   Follow up PRN.  I  spent 20 minutes dedicated to the care of this patient including pre-visit review of records, face to face time with the patient discussing her conditions and treatments and post visit orders.  Sheppard Evens MD MPH OB Fellow, Faculty Practice [facility], Center for [facility] 04/12/2023

## 2023-04-13 ENCOUNTER — Other Ambulatory Visit: Payer: Self-pay | Admitting: Family Medicine

## 2023-04-13 DIAGNOSIS — B9689 Other specified bacterial agents as the cause of diseases classified elsewhere: Secondary | ICD-10-CM

## 2023-04-13 DIAGNOSIS — B3731 Acute candidiasis of vulva and vagina: Secondary | ICD-10-CM

## 2023-04-13 MED ORDER — TINIDAZOLE 500 MG PO TABS
1000.0000 mg | ORAL_TABLET | Freq: Every day | ORAL | 0 refills | Status: DC
Start: 2023-04-13 — End: 2023-07-09

## 2023-04-13 MED ORDER — FLUCONAZOLE 150 MG PO TABS: 150.0000 mg | ORAL_TABLET | Freq: Once | ORAL | 0 refills | Status: DC

## 2023-05-09 ENCOUNTER — Ambulatory Visit: Admitting: Family Medicine

## 2023-05-15 ENCOUNTER — Ambulatory Visit (INDEPENDENT_AMBULATORY_CARE_PROVIDER_SITE_OTHER): Admitting: Family Medicine

## 2023-05-15 ENCOUNTER — Encounter: Payer: Self-pay | Admitting: Family Medicine

## 2023-05-15 VITALS — BP 125/83 | HR 78 | Temp 98.7°F | Ht 67.0 in | Wt 149.5 lb

## 2023-05-15 DIAGNOSIS — R5383 Other fatigue: Secondary | ICD-10-CM | POA: Diagnosis not present

## 2023-05-15 DIAGNOSIS — R0681 Apnea, not elsewhere classified: Secondary | ICD-10-CM | POA: Diagnosis not present

## 2023-05-15 DIAGNOSIS — R030 Elevated blood-pressure reading, without diagnosis of hypertension: Secondary | ICD-10-CM

## 2023-05-15 DIAGNOSIS — Z800 Family history of malignant neoplasm of digestive organs: Secondary | ICD-10-CM

## 2023-05-15 DIAGNOSIS — Z1231 Encounter for screening mammogram for malignant neoplasm of breast: Secondary | ICD-10-CM

## 2023-05-15 LAB — IBC + FERRITIN
Ferritin: 32 ng/mL (ref 10.0–291.0)
Iron: 107 ug/dL (ref 42–145)
Saturation Ratios: 23.9 % (ref 20.0–50.0)
TIBC: 448 ug/dL (ref 250.0–450.0)
Transferrin: 320 mg/dL (ref 212.0–360.0)

## 2023-05-15 LAB — CBC
HCT: 42.1 % (ref 36.0–46.0)
Hemoglobin: 13.5 g/dL (ref 12.0–15.0)
MCHC: 32 g/dL (ref 30.0–36.0)
MCV: 102.1 fl — ABNORMAL HIGH (ref 78.0–100.0)
Platelets: 230 10*3/uL (ref 150.0–400.0)
RBC: 4.12 Mil/uL (ref 3.87–5.11)
RDW: 12.9 % (ref 11.5–15.5)
WBC: 5 10*3/uL (ref 4.0–10.5)

## 2023-05-15 LAB — COMPREHENSIVE METABOLIC PANEL
ALT: 8 U/L (ref 0–35)
AST: 11 U/L (ref 0–37)
Albumin: 4 g/dL (ref 3.5–5.2)
Alkaline Phosphatase: 51 U/L (ref 39–117)
BUN: 17 mg/dL (ref 6–23)
CO2: 28 meq/L (ref 19–32)
Calcium: 9.4 mg/dL (ref 8.4–10.5)
Chloride: 104 meq/L (ref 96–112)
Creatinine, Ser: 0.82 mg/dL (ref 0.40–1.20)
GFR: 91.15 mL/min (ref >60.00)
Glucose, Bld: 82 mg/dL (ref 70–99)
Potassium: 4.1 meq/L (ref 3.5–5.1)
Sodium: 139 meq/L (ref 135–145)
Total Bilirubin: 0.4 mg/dL (ref 0.2–1.2)
Total Protein: 7 g/dL (ref 6.0–8.3)

## 2023-05-15 LAB — VITAMIN D 25 HYDROXY (VIT D DEFICIENCY, FRACTURES): VITD: 18.82 ng/mL — ABNORMAL LOW (ref 30.00–100.00)

## 2023-05-15 LAB — TSH: TSH: 1.45 u[IU]/mL (ref 0.35–5.50)

## 2023-05-15 NOTE — Progress Notes (Signed)
Chief Complaint  Patient presents with   Hypertension    Fatigue Headaches     Subjective Amy Reed is a 37 y.o. female who presents for elevated blood pressure. She does monitor home blood pressures. Blood pressures ranging from 120-140's/80's on average. She is not on any medication. She is sometimes adhering to a healthy diet overall. Current exercise: Some walking No CP or SOB.   She has a 2-year history of witnessed apneic episodes, fatigue, and feeling that she has never slept at all upon awakening.  She gets around 8 to 9 hours of sleep per night.  She is tired throughout the day.  She was referred to neurology for sleep study but never heard anything.  The patient's father is experiencing his sixth recurrence of colon cancer.  Both he and his siblings have been diagnosed with Lynch syndrome.  She was told she should get tested.  She has never seen a geneticist.  She is requesting labs today and was wondering if anything could be added on today.  Additionally, the patient was told she needs to have a mammogram.   Past Medical History:  Diagnosis Date   Allergy    Asthma    Family history of colon cancer    GERD (gastroesophageal reflux disease)     Exam BP 125/83 (BP Location: Right Arm, Patient Position: Sitting, Cuff Size: Normal)   Pulse 78   Temp 98.7 F (37.1 C) (Oral)   Ht 5\' 7"  (1.702 m)   Wt 149 lb 8 oz (67.8 kg)   SpO2 99%   BMI 23.42 kg/m  General:  well developed, well nourished, in no apparent distress Heart: RRR, no bruits, no LE edema Lungs: clear to auscultation, no accessory muscle use Mouth: MMM, no pharyngeal exudate or erythema Psych: well oriented with normal range of affect and appropriate judgment/insight  Elevated blood pressure reading  Fatigue, unspecified type - Plan: CBC, Comprehensive metabolic panel, VITAMIN D 25 Hydroxy (Vit-D Deficiency, Fractures), TSH, IBC + Ferritin, Ambulatory referral to Neurology  Family history of  Lynch syndrome - Plan: Lynch Syndrome Panel, MM DIGITAL SCREENING BILATERAL  Witnessed apneic spells - Plan: Ambulatory referral to Neurology  Encounter for screening mammogram for malignant neoplasm of breast - Plan: MM DIGITAL SCREENING BILATERAL  Blood pressure is okay today.  Monitor levels at home.  Counseled on diet and exercise.  I would like her to exercise more.  We agreed to hold off on medication for now.  If he does have untreated sleep apnea, her blood pressure could reduce from this alone. Seems like she is getting enough sleep, mood is stable.  Will check above labs but encouraged her to follow-up on her neurology referral to the sleep team. Check a mammogram, check the above panel, discussed that the blood work might not encompass or fully eliminate the possibility of having Lynch syndrome.  She was strongly encouraged to get back in with her gastroenterologist.  The number was provided in her paperwork.  We also discussed seeing a geneticist where we will refer her pending the results. Refer to the sleep team. Mammogram as above. F/u pending the above or as originally scheduled. The patient voiced understanding and agreement to the plan.  I spent 32 minutes with the patient discussing the above plans in addition to reviewing her chart on the same day of the visit.  Jilda Roche [place], DO 05/15/23  11:23 AM

## 2023-05-15 NOTE — Patient Instructions (Addendum)
Please contact the GI team at: [phone]  Keep the diet clean and stay active.  Aim to do some physical exertion for 150 minutes per week. This is typically divided into 5 days per week, 30 minutes per day. The activity should be enough to get your heart rate up. Anything is better than nothing if you have time constraints.  Please consider adding some weight resistance exercise to your routine. Consider yoga as well.   Give [place] 2-3 business days to get the results of your labs back.   If you do not hear anything about your referral in the next 1-2 weeks, call our office and ask for an update.  Let us know if you need anything.

## 2023-05-16 ENCOUNTER — Other Ambulatory Visit: Payer: Self-pay | Admitting: Family Medicine

## 2023-05-16 ENCOUNTER — Ambulatory Visit (INDEPENDENT_AMBULATORY_CARE_PROVIDER_SITE_OTHER)

## 2023-05-16 DIAGNOSIS — R5383 Other fatigue: Secondary | ICD-10-CM

## 2023-05-16 DIAGNOSIS — E559 Vitamin D deficiency, unspecified: Secondary | ICD-10-CM

## 2023-05-16 LAB — VITAMIN B12: Vitamin B-12: 328 pg/mL (ref 211–911)

## 2023-05-16 MED ORDER — VITAMIN D (ERGOCALCIFEROL) 1.25 MG (50000 UNIT) PO CAPS: 50000.0000 [IU] | ORAL_CAPSULE | ORAL | 0 refills | Status: DC

## 2023-05-17 ENCOUNTER — Telehealth: Payer: Self-pay | Admitting: Family Medicine

## 2023-05-17 NOTE — Telephone Encounter (Signed)
Victorino Dike from [employer] called & requested to receive a fax document containing the appt notes from the pt's recent genetic test. Their fax # is [phone].

## 2023-05-18 NOTE — Telephone Encounter (Signed)
They will need to call medical records at [phone].

## 2023-05-29 NOTE — Progress Notes (Deleted)
HPI :  Last seen 10/2021:  37 y.o. female with a past medical history significant for asthma. See PMH below for any additional history.    Patient known to Dr .Adela Lank. She was seen in 2019 for evaluation of dysphagia and [facility] of colon cancer.    Kennia is referred by PCP for diarrhea. She is accompanied by her wife and is here for evaluation of diarrhea which started about one ago. No blood in stool as far as she knows. She is having about 4-5 loose BMs a day. Having some nocturnal BMs. No significant abdominal pain. She has had some nausea associated with the diarrhea. No vomiting. No antibiotics in last several months. No medication or diet changes. Prior to a month ago she had solid BMs once a day` No weight loss. She has gained 30 pounds over the last years.   05/05/21 ED visit - syncope. EKG with NSR. Labs normal, neg pregnancy test. Normal TSH. UA was okay. Marland Kitchen    PREVIOUS GI EVALUATIONS:    Jan 2019 colonoscopy for [facility] of colon cancer  --normal.    Jan 2019 EGD for dysphagia.  --normal.    Feb 2019 pH testing and Esophageal Manometry --elevated LES resting pressure. Unclear if this contributed to symptoms  Repeat colonoscopy in 5 years    Past Medical History:  Diagnosis Date   Allergy    Asthma    Family history of colon cancer    GERD (gastroesophageal reflux disease)      Past Surgical History:  Procedure Laterality Date   22 HOUR PH STUDY N/A 10/10/2017   Procedure: 24 HOUR PH STUDY-OFF of PPI;  Surgeon: Napoleon Form, MD;  Location: [facility];  Service: Endoscopy;  Laterality: N/A;   DILATION AND CURETTAGE OF UTERUS     ESOPHAGEAL MANOMETRY N/A 10/10/2017   Procedure: ESOPHAGEAL MANOMETRY (EM);  Surgeon: Napoleon Form, MD;  [facility]: [facility];  Service: Endoscopy;  Laterality: N/A;   PH IMPEDANCE STUDY N/A 10/10/2017   Procedure: PH IMPEDANCE STUDY-OFF of PPI;  Surgeon: Napoleon Form, MD;  Location: [facility];  Service: Endoscopy;   Laterality: N/A;   Family History  Problem Relation Age of Onset   Colon cancer Father 23   Colon polyps Sister    Cervical polyp Sister    Colon polyps Brother    Diabetes Maternal Grandmother    Diabetes Maternal Grandfather    Diabetes Paternal Grandmother    Cervical cancer Paternal Grandmother    Diabetes Paternal Grandfather    Colon cancer Paternal Grandfather    Colon cancer Paternal Aunt    Colon cancer Paternal Aunt    Esophageal cancer Neg Hx    Stomach cancer Neg Hx    Rectal cancer Neg Hx    Social History   Tobacco Use   Smoking status: Never   Smokeless tobacco: Never  Vaping Use   Vaping status: Never Used  Substance Use Topics   Alcohol use: Yes    Comment: 2-3 times a month.    Drug use: No   Current Outpatient Medications  Medication Sig Dispense Refill   albuterol (VENTOLIN HFA) 108 (90 Base) MCG/ACT inhaler Inhale 2 puffs into the lungs every 6 (six) hours as needed for wheezing or shortness of breath. 18 g 2   EPINEPHrine (EPIPEN 2-PAK) 0.3 mg/0.3 mL IJ SOAJ injection Inject 0.3 mg into the muscle as needed for anaphylaxis. 1 each 2   fluticasone (FLOVENT HFA) 110 MCG/ACT inhaler Rinse  mouth out after use. Take 2 puffs twice daily when in yellow zone. 1 each 1   levocetirizine (XYZAL) 5 MG tablet Take 1 tablet (5 mg total) by mouth every evening. 30 tablet 2   montelukast (SINGULAIR) 10 MG tablet Take 1 tablet (10 mg total) by mouth at bedtime. 30 tablet 3   tinidazole (TINDAMAX) 500 MG tablet Take 2 tablets (1,000 mg total) by mouth daily. 10 tablet 0   Vitamin D, Ergocalciferol, (DRISDOL) 1.25 MG (50000 UNIT) CAPS capsule Take 1 capsule (50,000 Units total) by mouth every 7 (seven) days. 12 capsule 0   No current facility-administered medications for this visit.   Allergies  Allergen Reactions   Shellfish Allergy Anaphylaxis   Latex Itching     Review of Systems: All systems reviewed and negative except where noted in HPI.    No results  found.  Physical Exam: There were no vitals taken for this visit. Constitutional: Pleasant,well-developed, ***female in no acute distress. HEENT: Normocephalic and atraumatic. Conjunctivae are normal. No scleral icterus. Neck supple.  Cardiovascular: Normal rate, regular rhythm.  Pulmonary/chest: Effort normal and breath sounds normal. No wheezing, rales or rhonchi. Abdominal: Soft, nondistended, nontender. Bowel sounds active throughout. There are no masses palpable. No hepatomegaly. Extremities: no edema Lymphadenopathy: No cervical adenopathy noted. Neurological: Alert and oriented to person place and time. Skin: Skin is warm and dry. No rashes noted. Psychiatric: Normal mood and affect. Behavior is normal.   ASSESSMENT: 37 y.o. female here for assessment of the following  No diagnosis found.  PLAN:   Sharlene Dory*

## 2023-05-30 ENCOUNTER — Ambulatory Visit: Admitting: Gastroenterology

## 2023-05-30 ENCOUNTER — Telehealth: Payer: Self-pay | Admitting: Gastroenterology

## 2023-05-30 NOTE — Telephone Encounter (Signed)
Good Morning Dr. Adela Lank,   Patient called stating she would not be able to make her appointment with you this morning at 8:30 with no reason given.   Patient was rescheduled with Gunnar Fusi on 12/18 at 10:00

## 2023-05-30 NOTE — Telephone Encounter (Signed)
Okay thanks for letting me know

## 2023-06-13 LAB — LYNCH SYNDROME PANEL
Clinical Interpretation: POSITIVE
Result: POSITIVE

## 2023-06-16 ENCOUNTER — Ambulatory Visit (HOSPITAL_BASED_OUTPATIENT_CLINIC_OR_DEPARTMENT_OTHER)
Admission: RE | Admit: 2023-06-16 | Discharge: 2023-06-16 | Disposition: A | Discharge status: Home / Self Care | Source: Ambulatory Visit | Attending: Family Medicine | Admitting: Family Medicine

## 2023-06-16 DIAGNOSIS — Z1231 Encounter for screening mammogram for malignant neoplasm of breast: Secondary | ICD-10-CM | POA: Insufficient documentation

## 2023-06-19 ENCOUNTER — Ambulatory Visit: Admitting: Family Medicine

## 2023-06-20 ENCOUNTER — Other Ambulatory Visit: Payer: Self-pay | Admitting: Family Medicine

## 2023-06-20 DIAGNOSIS — R928 Other abnormal and inconclusive findings on diagnostic imaging of breast: Secondary | ICD-10-CM

## 2023-07-03 ENCOUNTER — Ambulatory Visit
Admission: RE | Admit: 2023-07-03 | Discharge: 2023-07-03 | Disposition: A | Discharge status: Home / Self Care | Source: Ambulatory Visit | Attending: Family Medicine

## 2023-07-03 ENCOUNTER — Ambulatory Visit
Admission: RE | Admit: 2023-07-03 | Discharge: 2023-07-03 | Disposition: A | Discharge status: Home / Self Care | Source: Ambulatory Visit | Attending: Family Medicine | Admitting: Family Medicine

## 2023-07-03 DIAGNOSIS — R928 Other abnormal and inconclusive findings on diagnostic imaging of breast: Secondary | ICD-10-CM

## 2023-07-05 ENCOUNTER — Other Ambulatory Visit

## 2023-07-05 ENCOUNTER — Encounter

## 2023-07-06 ENCOUNTER — Encounter

## 2023-07-06 ENCOUNTER — Encounter: Payer: Self-pay | Admitting: Obstetrics & Gynecology

## 2023-07-09 ENCOUNTER — Other Ambulatory Visit: Payer: Self-pay | Admitting: *Deleted

## 2023-07-09 ENCOUNTER — Other Ambulatory Visit: Payer: Self-pay

## 2023-07-09 DIAGNOSIS — B3731 Acute candidiasis of vulva and vagina: Secondary | ICD-10-CM

## 2023-07-09 DIAGNOSIS — B9689 Other specified bacterial agents as the cause of diseases classified elsewhere: Secondary | ICD-10-CM

## 2023-07-09 MED ORDER — FLUCONAZOLE 150 MG PO TABS: 150.0000 mg | ORAL_TABLET | Freq: Once | ORAL | 0 refills | Status: AC

## 2023-07-09 MED ORDER — METRONIDAZOLE 500 MG PO TABS: 500.0000 mg | ORAL_TABLET | Freq: Two times a day (BID) | ORAL | 0 refills | Status: DC

## 2023-07-09 MED ORDER — TINIDAZOLE 500 MG PO TABS: 1000.0000 mg | ORAL_TABLET | Freq: Every day | ORAL | 0 refills | Status: DC

## 2023-07-09 NOTE — Progress Notes (Signed)
Pt reports recurrent symptoms of BV and yeast. Dr. Jolayne Panther contacted regarding previous provider note and recommendations for TX. RX Tindamax and Diflucan per her recommendation.

## 2023-07-10 ENCOUNTER — Other Ambulatory Visit: Payer: Self-pay

## 2023-07-10 DIAGNOSIS — B9689 Other specified bacterial agents as the cause of diseases classified elsewhere: Secondary | ICD-10-CM

## 2023-07-10 MED ORDER — METRONIDAZOLE 0.75 % VA GEL: 1.0000 | Freq: Every day | VAGINAL | 1 refills | Status: DC

## 2023-07-16 ENCOUNTER — Encounter: Payer: Self-pay | Admitting: Neurology

## 2023-07-16 ENCOUNTER — Telehealth: Payer: Self-pay | Admitting: Neurology

## 2023-07-16 ENCOUNTER — Institutional Professional Consult (permissible substitution): Admitting: Neurology

## 2023-07-16 NOTE — Telephone Encounter (Signed)
NS for sleep consult.

## 2023-08-08 ENCOUNTER — Other Ambulatory Visit: Payer: Self-pay | Admitting: Family Medicine

## 2023-08-15 ENCOUNTER — Other Ambulatory Visit

## 2023-08-22 ENCOUNTER — Other Ambulatory Visit (INDEPENDENT_AMBULATORY_CARE_PROVIDER_SITE_OTHER)

## 2023-08-22 ENCOUNTER — Encounter: Payer: Self-pay | Admitting: Nurse Practitioner

## 2023-08-22 ENCOUNTER — Ambulatory Visit (INDEPENDENT_AMBULATORY_CARE_PROVIDER_SITE_OTHER): Admitting: Nurse Practitioner

## 2023-08-22 VITALS — BP 124/70 | HR 73 | Ht 68.0 in | Wt 151.0 lb

## 2023-08-22 DIAGNOSIS — Z1509 Genetic susceptibility to other malignant neoplasm: Secondary | ICD-10-CM | POA: Diagnosis not present

## 2023-08-22 LAB — URINALYSIS, ROUTINE W REFLEX MICROSCOPIC
Bilirubin Urine: NEGATIVE
Ketones, ur: NEGATIVE
Nitrite: NEGATIVE
Specific Gravity, Urine: 1.025 (ref 1.000–1.030)
Urine Glucose: NEGATIVE
Urobilinogen, UA: 1 (ref 0.0–1.0)
pH: 7 (ref 5.0–8.0)

## 2023-08-22 MED ORDER — NA SULFATE-K SULFATE-MG SULF 17.5-3.13-1.6 GM/177ML PO SOLN: ORAL | 0 refills | Status: DC

## 2023-08-22 NOTE — Patient Instructions (Signed)
Your provider has requested that you go to the basement level for lab work before leaving today. Press "B" on the elevator. The lab is located at the first door on the left as you exit the elevator.  You have been scheduled for an endoscopy and colonoscopy. Please follow the written instructions given to you at your visit today.  Please pick up your prep supplies at the pharmacy within the next 1-3 days.  If you use inhalers (even only as needed), please bring them with you on the day of your procedure.  DO NOT TAKE 7 DAYS PRIOR TO TEST- Trulicity (dulaglutide) Ozempic, Wegovy (semaglutide) Mounjaro (tirzepatide) Bydureon Bcise (exanatide extended release)  DO NOT TAKE 1 DAY PRIOR TO YOUR TEST Rybelsus (semaglutide) Adlyxin (lixisenatide) Victoza (liraglutide) Byetta (exanatide) ___________________________________________________________________________  We have sent the following medications to your pharmacy for you to pick up at your convenience: Suprep   _______________________________________________________  If your blood pressure at your visit was 140/90 or greater, please contact your primary care physician to follow up on this.  _______________________________________________________  If you are age 38 or older, your body mass index should be between 23-30. Your Body mass index is 22.96 kg/m. If this is out of the aforementioned range listed, please consider follow up with your Primary Care Provider.  If you are age 38 or younger, your body mass index should be between 19-25. Your Body mass index is 22.96 kg/m. If this is out of the aformentioned range listed, please consider follow up with your Primary Care Provider.   ________________________________________________________  The [facility] providers would like to encourage you to use [facility] to communicate with providers for non-urgent requests or questions.  Due to long hold times on the telephone, sending your  provider a message by [facility] may be a faster and more efficient way to get a response.  Please allow 48 business hours for a response.  Please remember that this is for non-urgent requests.  _______________________________________________________   Due to recent changes in healthcare laws, you may see the results of your imaging and laboratory studies on MyChart before your provider has had a chance to review them.  We understand that in some cases there may be results that are confusing or concerning to you. Not all laboratory results come back in the same time frame and the provider may be waiting for multiple results in order to interpret others.  Please give [place] 48 hours in order for your provider to thoroughly review all the results before contacting the office for clarification of your results.    Thank you for entrusting me with your care and choosing [facility].  Willette Cluster NP

## 2023-08-22 NOTE — Progress Notes (Signed)
Agree with assessment and plan as outlined.  In general for Lynch syndrome I recommend the following: -EGD and colonoscopy -Referral to gynecology for screening for endometrial/ovarian cancer -Referral to dermatology for screening skin exam -Urinalysis -Screening for pancreatic cancer is not recommended unless she has a strong first-degree family history of pancreas cancer -Consideration for starting a baby aspirin daily which can help reduce the risk for colon cancer in patients with Lynch syndrome    Gunnar Fusi if she has not already been referred, would place referral to dermatology and talk with her about starting a baby aspirin.  Thanks

## 2023-08-22 NOTE — Progress Notes (Signed)
ASSESSMENT    Brief Narrative:  37 y.o.  female known to Dr. 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  She had a normal screening colonoscopy  ( for her [facility]) in 2019. With recent diagnosis of Lynch Syndrome she need repeat colonoscopy and an EGD as well.   See PMH for any additional medical & surgical history   PLAN   --Schedule for EGD and colonoscopy. The risks and benefits of EGD with possible biopsies and colonoscopy with possible biopsies and removal of polyps were discussed with the patient who agrees to proceed.  --I don't know at what point, if ever, we need to screen her for pancreatic / biliary cancer but will discuss with Dr. Adela Lank.  --UA to evaluate for hematuria given diagnosis of Lynch Syndrome. --She was advised to contact GYN and let them know she has Lynch Syndrome. She last saw them in August (for a vaginal infection) prior to Lynch Syndrome diagnosis.    HPI   Chief complaint : Recently diagnosed Lynch Syndrome  Brief GI History:  Patient has a strong FMH of colon cancer and underwent a screening colonoscopy here in 2019. Colonoscopy was negative. Also had an EGD for dysphagia which was unremarkable. She was last seen here in 2023 for acute diarrhea.   Interval History:  Leeani has occasional rectal bleeding with BMs but mainly just when eating spicy foods. No other Gi complaints. She comes in today to get scheduled for colonoscopy.  Since her last visit she has been diagnosed with Lynch Syndrome.   Lynch syndrome panel Component Ref Range & Units (hover) 3 mo ago  Result POSITIVE  Comment: PATHOGENIC VARIANT IDENTIFIED  GENE SEE NOTE  Comment: . MLH1  VARIANT SEE NOTE  Comment: . [id number] ([id number]*)  CLASSIFICATION SEE NOTE  Comment: . PATHOGENIC  Gene List SEE NOTE  Comment: In total, 5 genes are analyzed as  part of this panel: EPCAM, MLH1, MSH2, MSH6, and PMS2.  Clinical Interpretation POSITIVE  Comment: A pathogenic variant was identified in one copy of the MLH1 gene. A pathogenic variant in the MLH1 gene is consistent with a diagnosis of Lynch syndrome, previously called hereditary non-polyposis colorectal cancer (HNPCC). Individuals with Lynch syndrome due to a pathogenic variant in MLH1 have an elevated risk of colorectal cancer (48%-57% by age 52) (PMID: 16010932 (2019)). Women have an elevated risk of endometrial cancer (up to 37% age 84) and ovarian cancer (11% by age 83) (PMID: 35573220 (2019)). Individuals with a pathogenic variant in MLH1 also have a combined risk of stomach, small bowel, bile duct, gallbladder, and pancreatic cancers (11%-22% by age 49), urinary tract cancer, and sebaceous neoplasms. Brain cancer may occur rarely, though the degree of risk is not well-defined (PMID: 25427062 (2019)). Reproductive risk: Offspring of parents who both carry a pathogenic MLH1 variant are at risk of constitutional mismatch repair deficiency (CMMRD) syndrome, a condition characterized by cafe au lait spots and an elevated risk of cancer (OMIM [phone]). There is an additional risk to the patient's offspring if their partner also has a clinically significant variant in the same gene. The patient's reproductive partner may wish to discuss genetic testing with a healthcare provider.        Latest Ref Rng & Units 05/15/2023   11:25 AM 07/26/2021    3:33 PM 05/05/2021    7:35 PM  Hepatic  Function  Total Protein 6.0 - 8.3 g/dL 7.0  7.5  8.3   Albumin 3.5 - 5.2 g/dL 4.0  4.5  4.8   AST 0 - 37 U/L 11  15  19    ALT 0 - 35 U/L 8  9  14    Alk Phosphatase 39 - 117 U/L 51  58  66   Total Bilirubin 0.2 - 1.2 mg/dL 0.4  0.6  0.2        Latest Ref Rng & Units 05/15/2023   11:25 AM 07/26/2021    3:33 PM 05/05/2021    7:35 PM  CBC  WBC 4.0 - 10.5 K/uL 5.0  5.8  7.6   Hemoglobin 12.0 - 15.0  g/dL 40.9  81.1  91.4   Hematocrit 36.0 - 46.0 % 42.1  40.0  43.3   Platelets 150.0 - 400.0 K/uL 230.0  220.0  263     Past Medical History:  Diagnosis Date   Allergy    Asthma    Family history of colon cancer    GERD (gastroesophageal reflux disease)    Lynch syndrome     Past Surgical History:  Procedure Laterality Date   54 HOUR PH STUDY N/A 10/10/2017   Procedure: 24 HOUR PH STUDY-OFF of PPI;  Surgeon: Napoleon Form, MD;  [facility]: [facility];  Service: Endoscopy;  Laterality: N/A;   DILATION AND CURETTAGE OF UTERUS     ESOPHAGEAL MANOMETRY N/A 10/10/2017   Procedure: ESOPHAGEAL MANOMETRY (EM);  Surgeon: Napoleon Form, MD;  [facility];  Service: Endoscopy;  Laterality: N/A;   PH IMPEDANCE STUDY N/A 10/10/2017   Procedure: PH IMPEDANCE STUDY-OFF of PPI;  Surgeon: Napoleon Form, MD;  [facility]: [facility];  Service: Endoscopy;  Laterality: N/A;    Family History  Problem Relation Age of Onset   Colon cancer Father 13   Colon polyps Sister    Cervical polyp Sister    Colon cancer Paternal Aunt    Breast cancer Paternal Aunt    Colon cancer Paternal Aunt    Diabetes Maternal Grandmother    Diabetes Maternal Grandfather    Breast cancer Paternal Grandmother    Diabetes Paternal Grandmother    Cervical cancer Paternal Grandmother    Diabetes Paternal Grandfather    Colon cancer Paternal Grandfather    Colon polyps Brother    Esophageal cancer Neg Hx    Stomach cancer Neg Hx    Rectal cancer Neg Hx     Current Medications, Allergies, Family History and Social History were reviewed in [employer] electronic medical record.     Current Outpatient Medications  Medication Sig Dispense Refill   albuterol (VENTOLIN HFA) 108 (90 Base) MCG/ACT inhaler Inhale 2 puffs into the lungs every 6 (six) hours as needed for wheezing or shortness of breath. 18 g 2   EPINEPHrine (EPIPEN 2-PAK) 0.3 mg/0.3 mL IJ SOAJ injection Inject 0.3 mg into the muscle  as needed for anaphylaxis. 1 each 2   fluticasone (FLOVENT HFA) 110 MCG/ACT inhaler Rinse mouth out after use. Take 2 puffs twice daily when in yellow zone. 1 each 1   levocetirizine (XYZAL) 5 MG tablet Take 1 tablet (5 mg total) by mouth every evening. 30 tablet 2   montelukast (SINGULAIR) 10 MG tablet Take 1 tablet (10 mg total) by mouth at bedtime. 30 tablet 3   tinidazole (TINDAMAX) 500 MG tablet Take 2 tablets (1,000 mg total) by mouth daily. 10 tablet 0   Vitamin D,  Ergocalciferol, (DRISDOL) 1.25 MG (50000 UNIT) CAPS capsule TAKE 1 CAPSULE BY MOUTH EVERY 7 DAYS 12 capsule 0   No current facility-administered medications for this visit.    Review of Systems: No chest pain. No shortness of breath. No urinary complaints.    Physical Exam  Filed Weights   08/22/23 0955  Weight: 151 lb (68.5 kg)   Wt Readings from Last 3 Encounters:  08/22/23 151 lb (68.5 kg)  05/15/23 149 lb 8 oz (67.8 kg)  04/12/23 143 lb 9.6 oz (65.1 kg)    BP 124/70   Pulse 73   Ht 5\' 8"  (1.727 m)   Wt 151 lb (68.5 kg)   SpO2 98%   BMI 22.96 kg/m  Constitutional:  Pleasant, generally well appearing female in no acute distress. Psychiatric: Normal mood and affect. Behavior is normal. EENT: Pupils normal.  Conjunctivae are normal. No scleral icterus. Neck supple.  Cardiovascular: Normal rate, regular rhythm.  Pulmonary/chest: Effort normal and breath sounds normal. No wheezing, rales or rhonchi. Abdominal: Soft, nondistended, nontender. Bowel sounds active throughout. There are no masses palpable. No hepatomegaly. Neurological: Alert and oriented to person place and time.    Willette Cluster, NP  08/22/2023, 10:27 AM  Cc:  Sharlene Dory*

## 2023-08-30 ENCOUNTER — Other Ambulatory Visit: Payer: Self-pay | Admitting: *Deleted

## 2023-08-30 DIAGNOSIS — N390 Urinary tract infection, site not specified: Secondary | ICD-10-CM

## 2023-09-04 ENCOUNTER — Ambulatory Visit: Admitting: Family Medicine

## 2023-09-07 ENCOUNTER — Ambulatory Visit (INDEPENDENT_AMBULATORY_CARE_PROVIDER_SITE_OTHER): Admitting: Family Medicine

## 2023-09-07 VITALS — BP 120/78 | HR 84 | Temp 98.0°F | Resp 16 | Ht 68.0 in | Wt 154.8 lb

## 2023-09-07 DIAGNOSIS — E559 Vitamin D deficiency, unspecified: Secondary | ICD-10-CM

## 2023-09-07 DIAGNOSIS — N898 Other specified noninflammatory disorders of vagina: Secondary | ICD-10-CM

## 2023-09-07 DIAGNOSIS — Z1509 Genetic susceptibility to other malignant neoplasm: Secondary | ICD-10-CM | POA: Diagnosis not present

## 2023-09-07 DIAGNOSIS — R0789 Other chest pain: Secondary | ICD-10-CM | POA: Diagnosis not present

## 2023-09-07 DIAGNOSIS — J4530 Mild persistent asthma, uncomplicated: Secondary | ICD-10-CM | POA: Diagnosis not present

## 2023-09-07 LAB — URINALYSIS, MICROSCOPIC ONLY

## 2023-09-07 LAB — VITAMIN D 25 HYDROXY (VIT D DEFICIENCY, FRACTURES): VITD: 37.22 ng/mL (ref 30.00–100.00)

## 2023-09-07 MED ORDER — ALBUTEROL SULFATE HFA 108 (90 BASE) MCG/ACT IN AERS: 2.0000 | INHALATION_SPRAY | Freq: Four times a day (QID) | RESPIRATORY_TRACT | 2 refills | Status: DC | PRN

## 2023-09-07 NOTE — Patient Instructions (Addendum)
 Give us  2-3 business days to get the results of your labs back.   Consider a vaginal probiotic like RepHresh.   I expect things to improve over the next several weeks. Let me know if you do not.  If you do not hear anything about your referral in the next 1-2 weeks, call our office and ask for an update.  Let us  know if you need anything.  Pectoralis Major Rehab Ask your health care provider which exercises are safe for you. Do exercises exactly as told by your health care provider and adjust them as directed. It is normal to feel mild stretching, pulling, tightness, or discomfort as you do these exercises, but you should stop right away if you feel sudden pain or your pain gets worse. Do not begin these exercises until told by your health care provider. Stretching and range of motion exercises These exercises warm up your muscles and joints and improve the movement and flexibility of your shoulder. These exercises can also help to relieve pain, numbness, and tingling. Exercise A: Pendulum  Stand near a wall or a surface that you can hold onto for balance. Bend at the waist and let your left / right arm hang straight down. Use your other arm to keep your balance. Relax your arm and shoulder muscles, and move your hips and your trunk so your left / right arm swings freely. Your arm should swing because of the motion of your body, not because you are using your arm or shoulder muscles. Keep moving so your arm swings in the following directions, as told by your health care provider: Side to side. Forward and backward. In clockwise and counterclockwise circles. Slowly return to the starting position. Repeat 2 times. Complete this exercise 3 times per week. Exercise B: Abduction, standing Stand and hold a broomstick, a cane, or a similar object. Place your hands a little more than shoulder-width apart on the object. Your left / right hand should be palm-up, and your other hand should be  palm-down. While keeping your elbow straight and your shoulder muscles relaxed, push the stick across your body toward your left / right side. Raise your left / right arm to the side of your body and then over your head until you feel a stretch in your shoulder. Stop when you reach the angle that is recommended by your health care provider. Avoid shrugging your shoulder while you raise your arm. Keep your shoulder blade tucked down toward the middle of your spine. Hold for 10 seconds. Slowly return to the starting position. Repeat 2 times. Complete this exercise 3 times per week. Exercise C: Wand flexion, supine  Lie on your back. You may bend your knees for comfort. Hold a broomstick, a cane, or a similar object so that your hands are about shoulder-width apart on the object. Your palms should face toward your feet. Raise your left / right arm in front of your face, then behind your head (toward the floor). Use your other hand to help you do this. Stop when you feel a gentle stretch in your shoulder, or when you reach the angle that is recommended by your health care provider. Hold for 3 seconds. Use the broomstick and your other arm to help you return your left / right arm to the starting position. Repeat 2 times. Complete this exercise 3 times per week. Exercise D: Wand shoulder external rotation Stand and hold a broomstick, a cane, or a similar object so your hands are  about shoulder-width apart on the object. Start with your arms hanging down, then bend both elbows to an L shape (90 degrees). Keep your left / right elbow at your side. Use your other hand to push the stick so your left / right forearm moves away from your body, out to your side. Keep your left / right elbow bent to 90 degrees and keep it against your side. Stop when you feel a gentle stretch in your shoulder, or when you reach the angle recommended by your health care provider. Hold for 10 seconds. Use the stick to help  you return your left / right arm to the starting position. Repeat 2 times. Complete this exercise 3 times per week. Strengthening exercises These exercises build strength and endurance in your shoulder. Endurance is the ability to use your muscles for a long time, even after your muscles get tired. Exercise E: Scapular protraction, standing Stand so you are facing a wall. Place your feet about one arm-length away from the wall. Place your hands on the wall and straighten your elbows. Keep your hands on the wall as you push your upper back away from the wall. You should feel your shoulder blades sliding forward. Keep your elbows and your head still. If you are not sure that you are doing this exercise correctly, ask your health care provider for more instructions. Hold for 3 seconds. Slowly return to the starting position. Let your muscles relax completely before you repeat this exercise. Repeat 2 times. Complete this exercise 3 times per week. Exercise F: Shoulder blade squeezes  (scapular retraction) Sit with good posture in a stable chair. Do not let your back touch the back of the chair. Your arms should be at your sides with your elbows bent. You may rest your forearms on a pillow if that is more comfortable. Squeeze your shoulder blades together. Bring them down and back. Keep your shoulders level. Do not lift your shoulders up toward your ears. Hold for 3 seconds. Return to the starting position. Repeat 2 times. Complete this exercise 3 times per week. This information is not intended to replace advice given to you by your health care provider. Make sure you discuss any questions you have with your health care provider. Document Released: 08/21/2005 Document Revised: 06/01/2016 Document Reviewed: 05/09/2015 Elsevier Interactive Patient Education  [employer].

## 2023-09-07 NOTE — Progress Notes (Signed)
 Chief Complaint  Patient presents with   Follow-up    Follow up    Amy Reed is a 38 y.o. female here for evaluation of chest pain.  Duration of issue: 10 days Quality: sharp Palliation: none Provocation: coughing She was sick prior to this happening-she was evaluated at [facility] and troponin was undetectable. Radiation: None Duration of chest pain: Constant Associated symptoms: No wheezing or shortness of breath currently, no other upper respiratory symptoms any longer Cardiac history: None Family heart history: None Smoker? No  Patient gets recurrent bouts of vaginal odor.  She has been working with her gynecology team to help with this.  She will get treatment and then subsequently developed a yeast infection.  This cyclical sequence of events keeps repeating himself.  No urinary complaints.  She did have a recent microscopy showing contamination.  She is interested in having this repeated here.  Recently diagnosed with Lynch syndrome.  She is following with the gastroenterology team who recommended she see the dermatology team.  No skin lesions or moles she is concerned about at this time.  She does not have a dermatologist currently.  She reports wanting to be tested for everything and her blood work.  Past Medical History:  Diagnosis Date   Allergy     Asthma    Family history of colon cancer    GERD (gastroesophageal reflux disease)    Lynch syndrome    Family History  Problem Relation Age of Onset   Colon cancer Father 15   Colon polyps Sister    Cervical polyp Sister    Colon cancer Paternal Aunt    Breast cancer Paternal Aunt    Colon cancer Paternal Aunt    Diabetes Maternal Grandmother    Diabetes Maternal Grandfather    Breast cancer Paternal Grandmother    Diabetes Paternal Grandmother    Cervical cancer Paternal Grandmother    Diabetes Paternal Grandfather    Colon cancer Paternal Grandfather    Colon polyps Brother    Esophageal cancer Neg Hx     Stomach cancer Neg Hx    Rectal cancer Neg Hx     BP 120/78   Pulse 84   Temp 98 F (36.7 C) (Oral)   Resp 16   Ht 5' 8 (1.727 m)   Wt 154 lb 12.8 oz (70.2 kg)   SpO2 96%   BMI 23.54 kg/m  Gen: awake, alert, appears stated age HEENT: PERRLA, MMM Heart: RRR, no bruits, no LE edema Lungs: CTAB, no accessory muscle use MSK: chest pain is reproducible to palpation over cephalad portion of sternum Psych: Age appropriate judgment and insight, nml mood and affect  Vaginal odor - Plan: Urine Microscopic Only, Urine Culture  Lynch syndrome - Plan: Ambulatory referral to Dermatology  Atypical chest pain  Mild persistent asthma, unspecified whether complicated - Plan: albuterol  (VENTOLIN  HFA) 108 (90 Base) MCG/ACT inhaler  Vitamin D  deficiency - Plan: VITAMIN D  25 Hydroxy (Vit-D Deficiency, Fractures)  Consider vaginal probiotic.  Check urine culture and microscopy.  Reviewed previous results showing contamination.  Stay hydrated. Refer to dermatology.  We reviewed her labs on 12/25 at [facility] when she was seen for an upper respiratory infection.  Will hold off on repeating those tests. Given recent reassuring workup and prior upper respite infection with coughing, this is likely musculoskeletal in etiology.  Pectoral stretches/exercises provided.  If no improvement over the next several weeks, she will let me know. Refill albuterol  as this could be contributing  to #3. Check vitamin D . F/u as originally scheduled. The patient voiced understanding and agreement to the plan.  I spent 36 minutes with the patient discussing the above plans in addition to reviewing her chart on the same day of the visit.  Mabel [place], DO 09/07/23 8:59 AM

## 2023-09-08 LAB — URINE CULTURE
MICRO NUMBER:: 15914786
Result:: NO GROWTH
SPECIMEN QUALITY:: ADEQUATE

## 2023-09-09 ENCOUNTER — Encounter: Payer: Self-pay | Admitting: Family Medicine

## 2023-10-10 ENCOUNTER — Encounter: Payer: Self-pay | Admitting: Gastroenterology

## 2023-10-10 ENCOUNTER — Ambulatory Visit (AMBULATORY_SURGERY_CENTER): Admitting: Gastroenterology

## 2023-10-10 VITALS — BP 119/67 | HR 72 | Temp 98.1°F | Resp 15 | Ht 68.0 in | Wt 151.0 lb

## 2023-10-10 DIAGNOSIS — Z1211 Encounter for screening for malignant neoplasm of colon: Secondary | ICD-10-CM | POA: Diagnosis present

## 2023-10-10 DIAGNOSIS — K2289 Other specified disease of esophagus: Secondary | ICD-10-CM

## 2023-10-10 DIAGNOSIS — Z800 Family history of malignant neoplasm of digestive organs: Secondary | ICD-10-CM | POA: Diagnosis not present

## 2023-10-10 DIAGNOSIS — Z1509 Genetic susceptibility to other malignant neoplasm: Secondary | ICD-10-CM | POA: Diagnosis not present

## 2023-10-10 DIAGNOSIS — K3189 Other diseases of stomach and duodenum: Secondary | ICD-10-CM | POA: Diagnosis not present

## 2023-10-10 DIAGNOSIS — K449 Diaphragmatic hernia without obstruction or gangrene: Secondary | ICD-10-CM | POA: Diagnosis not present

## 2023-10-10 DIAGNOSIS — K317 Polyp of stomach and duodenum: Secondary | ICD-10-CM | POA: Diagnosis not present

## 2023-10-10 MED ORDER — SODIUM CHLORIDE 0.9 % IV SOLN: 500.0000 mL | INTRAVENOUS | Status: DC

## 2023-10-10 NOTE — Op Note (Signed)
 [facility] Patient Name: Amy Reed Procedure Date: 10/10/2023 2:25 PM MRN: [id number] Endoscopist: Elspeth P. Leigh , MD, [id number] Age: 38 Referring MD:  Date of Birth: March 16, 1986 Gender: Female Account #: [id number] Procedure:                Colonoscopy Indications:              Lynch Syndrome - father had colon cancer dx age 47s Medicines:                Monitored Anesthesia Care Procedure:                Pre-Anesthesia Assessment:                           - Prior to the procedure, a History and Physical                            was performed, and patient medications and                            allergies were reviewed. The patient's tolerance of                            previous anesthesia was also reviewed. The risks                            and benefits of the procedure and the sedation                            options and risks were discussed with the patient.                            All questions were answered, and informed consent                            was obtained. Prior Anticoagulants: The patient has                            taken no anticoagulant or antiplatelet agents. ASA                            Grade Assessment: II - A patient with mild systemic                            disease. After reviewing the risks and benefits,                            the patient was deemed in satisfactory condition to                            undergo the procedure.                           After obtaining informed consent, the colonoscope  was passed under direct vision. Throughout the                            procedure, the patient's blood pressure, pulse, and                            oxygen saturations were monitored continuously. The                            [id number]-HQ190L Colonoscope [id number] was introduced                            through the anus and advanced to the the cecum,                             identified by appendiceal orifice and ileocecal                            valve. The colonoscopy was performed without                            difficulty. The patient tolerated the procedure                            well. The quality of the bowel preparation was                            good. The ileocecal valve, appendiceal orifice, and                            rectum were photographed. Scope In: 2:50:42 PM Scope Out: 3:02:32 PM Scope Withdrawal Time: 0 hours 9 minutes 20 seconds  Total Procedure Duration: 0 hours 11 minutes 50 seconds  Findings:                 The perianal and digital rectal examinations were                            normal.                           The exam was otherwise without abnormality on                            direct and retroflexion views. Complications:            No immediate complications. Estimated blood loss:                            None. Estimated Blood Loss:     Estimated blood loss: none. Impression:               - The examination was otherwise normal on direct                            and retroflexion views.                           -  No polyps. Recommendation:           - Patient has a contact number available for                            emergencies. The signs and symptoms of potential                            delayed complications were discussed with the                            patient. Return to normal activities tomorrow.                            Written discharge instructions were provided to the                            patient.                           - Resume previous diet.                           - Continue present medications.                           - Repeat colonoscopy in 1 to 2 years for screening                            purposes for history of Lynch syndrome                           - Follow up with Gynecology and Dermatology if not                            already scheduled, as  previously recommended at                            clinic visit Roch Quach P. Leigh, MD 10/10/2023 3:08:49 PM This report has been signed electronically.

## 2023-10-10 NOTE — Progress Notes (Signed)
 Called to room to assist during endoscopic procedure.  Patient ID and intended procedure confirmed with present staff. Received instructions for my participation in the procedure from the performing physician.

## 2023-10-10 NOTE — Op Note (Signed)
 [facility] Patient Name: Amy Reed Procedure Date: 10/10/2023 2:28 PM MRN: [id number] Endoscopist: Elspeth P. Leigh , MD, [id number] Age: 38 Referring MD:  Date of Birth: Jul 24, 1986 Gender: Female Account #: [id number] Procedure:                Upper GI endoscopy Indications:              Lynch Syndrome Medicines:                Monitored Anesthesia Care Procedure:                Pre-Anesthesia Assessment:                           - Prior to the procedure, a History and Physical                            was performed, and patient medications and                            allergies were reviewed. The patient's tolerance of                            previous anesthesia was also reviewed. The risks                            and benefits of the procedure and the sedation                            options and risks were discussed with the patient.                            All questions were answered, and informed consent                            was obtained. Prior Anticoagulants: The patient has                            taken no anticoagulant or antiplatelet agents. ASA                            Grade Assessment: II - A patient with mild systemic                            disease. After reviewing the risks and benefits,                            the patient was deemed in satisfactory condition to                            undergo the procedure.                           After obtaining informed consent, the endoscope was  passed under direct vision. Throughout the                            procedure, the patient's blood pressure, pulse, and                            oxygen saturations were monitored continuously. The                            Olympus Scope [id number] was introduced through the                            mouth, and advanced to the second part of duodenum.                            The upper GI endoscopy was  accomplished without                            difficulty. The patient tolerated the procedure                            well. Scope In: Scope Out: Findings:                 Esophagogastric landmarks were identified: the                            Z-line was found at 36 cm, the gastroesophageal                            junction was found at 36 cm and the upper extent of                            the gastric folds was found at 37 cm from the                            incisors. Z line regular with a diminitive island                            of salmon colored mucosa proxima to the GEJ                           A 1 cm hiatal hernia was present.                           The exam of the esophagus was otherwise normal.                           The entire examined stomach was normal. Biopsies                            were taken with a cold forceps for Helicobacter  pylori testing.                           A single small benign appearing nodule / polyp was                            found in the second portion of the duodenum (not                            the ampulla which was visualized in a different                            area and normal), not overtly adenomatous. Biopsies                            were taken with a cold forceps for histology.                           The exam of the duodenum was otherwise normal. Complications:            No immediate complications. Estimated blood loss:                            Minimal. Estimated Blood Loss:     Estimated blood loss was minimal. Impression:               - Esophagogastric landmarks identified.                           - 1 cm hiatal hernia.                           - Normal esophagus.                           - Normal stomach. Biopsied.                           - Benign nodule / polypoid lesions found in the                            duodenum. Biopsied.                           -  Normal ampulla                           - Normal duodenum otherwise. Recommendation:           - Patient has a contact number available for                            emergencies. The signs and symptoms of potential                            delayed complications were discussed with the  patient. Return to normal activities tomorrow.                            Written discharge instructions were provided to the                            patient.                           - Resume previous diet.                           - Continue present medications.                           - Await pathology results. Elspeth P. Leigh, MD 10/10/2023 2:49:27 PM This report has been signed electronically.

## 2023-10-10 NOTE — Progress Notes (Signed)
 Pt A/O x 3, gd SR's, pleased with anesthesia, report to RN

## 2023-10-10 NOTE — Progress Notes (Signed)
 Pt's states no medical or surgical changes since previsit or office visit.

## 2023-10-10 NOTE — Progress Notes (Signed)
 [facility] History and Physical   Primary Care Physician:  Frann Mabel Mt, DO   Reason for Procedure:   Lynch syndrome  Plan:    EGD and colonoscopy     HPI: Amy Reed is a 38 y.o. female  here for EGD and colonoscopy for history of Lynch syndrome - father had CRC dx age 55s.   . Patient denies any bowel symptoms at this time.Otherwise feels well without any cardiopulmonary symptoms.   I have discussed risks / benefits of anesthesia and endoscopic procedure with [name] and they wish to proceed with the exams as outlined today.    Past Medical History:  Diagnosis Date   Allergy     Asthma    Family history of colon cancer    GERD (gastroesophageal reflux disease)    Lynch syndrome     Past Surgical History:  Procedure Laterality Date   68 HOUR PH STUDY N/A 10/10/2017   Procedure: 24 HOUR PH STUDY-OFF of PPI;  Surgeon: Shila Gustav GAILS, MD;  Location: [facility];  Service: Endoscopy;  Laterality: N/A;   DILATION AND CURETTAGE OF UTERUS     ESOPHAGEAL MANOMETRY N/A 10/10/2017   Procedure: ESOPHAGEAL MANOMETRY (EM);  Surgeon: Shila Gustav GAILS, MD;  Location: [facility];  Service: Endoscopy;  Laterality: N/A;   PH IMPEDANCE STUDY N/A 10/10/2017   Procedure: PH IMPEDANCE STUDY-OFF of PPI;  Surgeon: Shila Gustav GAILS, MD;  Location: [facility];  Service: Endoscopy;  Laterality: N/A;    Prior to Admission medications   Medication Sig Start Date End Date Taking? Authorizing Provider  albuterol  (VENTOLIN  HFA) 108 (90 Base) MCG/ACT inhaler Inhale 2 puffs into the lungs every 6 (six) hours as needed for wheezing or shortness of breath. 09/07/23   Frann Mabel Mt, DO  EPINEPHrine  (EPIPEN  2-PAK) 0.3 mg/0.3 mL IJ SOAJ injection Inject 0.3 mg into the muscle as needed for anaphylaxis. 03/18/21   Frann Mabel Mt, DO  fluticasone  (FLOVENT  HFA) 110 MCG/ACT inhaler Rinse mouth out after use. Take 2 puffs twice daily when in yellow  zone. 06/05/22   Frann Mabel Mt, DO  levocetirizine (XYZAL ) 5 MG tablet Take 1 tablet (5 mg total) by mouth every evening. 03/21/22   Frann Mabel Mt, DO  montelukast  (SINGULAIR ) 10 MG tablet Take 1 tablet (10 mg total) by mouth at bedtime. 03/21/22   Frann Mabel Mt, DO  tinidazole  (TINDAMAX ) 500 MG tablet Take 2 tablets (1,000 mg total) by mouth daily. 07/09/23   Constant, Peggy, MD  Vitamin D , Ergocalciferol , (DRISDOL ) 1.25 MG (50000 UNIT) CAPS capsule TAKE 1 CAPSULE BY MOUTH EVERY 7 DAYS 08/08/23   Wendling, Mabel Mt, DO    Current Outpatient Medications  Medication Sig Dispense Refill   albuterol  (VENTOLIN  HFA) 108 (90 Base) MCG/ACT inhaler Inhale 2 puffs into the lungs every 6 (six) hours as needed for wheezing or shortness of breath. 18 g 2   EPINEPHrine  (EPIPEN  2-PAK) 0.3 mg/0.3 mL IJ SOAJ injection Inject 0.3 mg into the muscle as needed for anaphylaxis. 1 each 2   fluticasone  (FLOVENT  HFA) 110 MCG/ACT inhaler Rinse mouth out after use. Take 2 puffs twice daily when in yellow zone. 1 each 1   levocetirizine (XYZAL ) 5 MG tablet Take 1 tablet (5 mg total) by mouth every evening. 30 tablet 2   montelukast  (SINGULAIR ) 10 MG tablet Take 1 tablet (10 mg total) by mouth at bedtime. 30 tablet 3   tinidazole  (TINDAMAX ) 500 MG tablet Take 2 tablets (1,000 mg  total) by mouth daily. 10 tablet 0   Vitamin D , Ergocalciferol , (DRISDOL ) 1.25 MG (50000 UNIT) CAPS capsule TAKE 1 CAPSULE BY MOUTH EVERY 7 DAYS 12 capsule 0   Current Facility-Administered Medications  Medication Dose Route Frequency Provider Last Rate Last Admin   0.9 %  sodium chloride  infusion  500 mL Intravenous Continuous Denzell Colasanti, Elspeth SQUIBB, MD        Allergies as of 10/10/2023 - Review Complete 10/10/2023  Allergen Reaction Noted   Shellfish allergy  Anaphylaxis 07/29/2017   Latex Itching 01/23/2017    Family History  Problem Relation Age of Onset   Colon cancer Father 43   Colon polyps Sister     Cervical polyp Sister    Colon cancer Paternal Aunt    Breast cancer Paternal Aunt    Colon cancer Paternal Aunt    Diabetes Maternal Grandmother    Diabetes Maternal Grandfather    Breast cancer Paternal Grandmother    Diabetes Paternal Grandmother    Cervical cancer Paternal Grandmother    Diabetes Paternal Grandfather    Colon cancer Paternal Grandfather    Colon polyps Brother    Esophageal cancer Neg Hx    Stomach cancer Neg Hx    Rectal cancer Neg Hx     Social History   Socioeconomic History   Marital status: Married    Spouse name: Not on file   Number of children: Not on file   Years of education: Not on file   Highest education level: Some college, no degree  Occupational History   Not on file  Tobacco Use   Smoking status: Some Days    Types: Cigarettes   Smokeless tobacco: Never  Vaping Use   Vaping status: Never Used  Substance and Sexual Activity   Alcohol use: Yes    Comment: 2-3 times a month.    Drug use: No   Sexual activity: Yes    Partners: Female    Birth control/protection: None  Other Topics Concern   Not on file  Social History Narrative   Not on file   Social Drivers of Health   Financial Resource Strain: Medium Risk (09/07/2023)   Overall Financial Resource Strain (CARDIA)    Difficulty of Paying Living Expenses: Somewhat hard  Food Insecurity: No Food Insecurity (09/07/2023)   Hunger Vital Sign    Worried About Running Out of Food in the Last Year: Never true    Ran Out of Food in the Last Year: Never true  Transportation Needs: No Transportation Needs (09/07/2023)   PRAPARE - Administrator, Civil Service (Medical): No    Lack of Transportation (Non-Medical): No  Physical Activity: Unknown (09/07/2023)   Exercise Vital Sign    Days of Exercise per Week: 0 days    Minutes of Exercise per Session: Not on file  Stress: No Stress Concern Present (09/07/2023)   [employer] of Occupational Health - Occupational Stress  Questionnaire    Feeling of Stress : Only a little  Social Connections: Unknown (09/07/2023)   Social Connection and Isolation Panel [NHANES]    Frequency of Communication with Friends and Family: More than three times a week    Frequency of Social Gatherings with Friends and Family: Once a week    Attends Religious Services: Patient declined    Database Administrator or Organizations: Yes    Attends Banker Meetings: Never    Marital Status: Married  Catering Manager Violence: Not on file  Review of Systems: All other review of systems negative except as mentioned in the HPI.  Physical Exam: Vital signs BP 135/88   Pulse 84   Temp 98.1 F (36.7 C) (Temporal)   Resp 16   Ht 5' 8 (1.727 m)   Wt 151 lb (68.5 kg)   SpO2 100%   BMI 22.96 kg/m   General:   Alert,  Well-developed, pleasant and cooperative in NAD Lungs:  Clear throughout to auscultation.   Heart:  Regular rate and rhythm Abdomen:  Soft, nontender and nondistended.   Neuro/Psych:  Alert and cooperative. Normal mood and affect. A and O x 3  Marcey Naval, MD [facility]

## 2023-10-10 NOTE — Patient Instructions (Signed)
 Please read handouts provided. Continue present medications. Await pathology results. Resume previous diet. Repeat colonoscopy in 1-2 years for screening. Follow-up with Gynecology and Dermatology.   YOU HAD AN ENDOSCOPIC PROCEDURE TODAY AT THE [facility]:   Refer to the procedure report that was given to you for any specific questions about what was found during the examination.  If the procedure report does not answer your questions, please call your gastroenterologist to clarify.  If you requested that your care partner not be given the details of your procedure findings, then the procedure report has been included in a sealed envelope for you to review at your convenience later.  YOU SHOULD EXPECT: Some feelings of bloating in the abdomen. Passage of more gas than usual.  Walking can help get rid of the air that was put into your GI tract during the procedure and reduce the bloating. If you had a lower endoscopy (such as a colonoscopy or flexible sigmoidoscopy) you may notice spotting of blood in your stool or on the toilet paper. If you underwent a bowel prep for your procedure, you may not have a normal bowel movement for a few days.  Please Note:  You might notice some irritation and congestion in your nose or some drainage.  This is from the oxygen used during your procedure.  There is no need for concern and it should clear up in a day or so.  SYMPTOMS TO REPORT IMMEDIATELY:  Following lower endoscopy (colonoscopy or flexible sigmoidoscopy):  Excessive amounts of blood in the stool  Significant tenderness or worsening of abdominal pains  Swelling of the abdomen that is new, acute  Fever of 100F or higher  Following upper endoscopy (EGD)  Vomiting of blood or coffee ground material  New chest pain or pain under the shoulder blades  Painful or persistently difficult swallowing  New shortness of breath  Fever of 100F or higher  Black, tarry-looking stools  For  urgent or emergent issues, a gastroenterologist can be reached at any hour by calling [phone]. Do not use MyChart messaging for urgent concerns.    DIET:  We do recommend a small meal at first, but then you may proceed to your regular diet.  Drink plenty of fluids but you should avoid alcoholic beverages for 24 hours.  ACTIVITY:  You should plan to take it easy for the rest of today and you should NOT DRIVE or use heavy machinery until tomorrow (because of the sedation medicines used during the test).    FOLLOW UP: Our staff will call the number listed on your records the next business day following your procedure.  We will call around 7:15- 8:00 am to check on you and address any questions or concerns that you may have regarding the information given to you following your procedure. If we do not reach you, we will leave a message.     If any biopsies were taken you will be contacted by phone or by letter within the next 1-3 weeks.  Please call us  at [phone] if you have not heard about the biopsies in 3 weeks.    SIGNATURES/CONFIDENTIALITY: You and/or your care partner have signed paperwork which will be entered into your electronic medical record.  These signatures attest to the fact that that the information above on your After Visit Summary has been reviewed and is understood.  Full responsibility of the confidentiality of this discharge information lies with you and/or your care-partner.

## 2023-10-11 ENCOUNTER — Telehealth: Payer: Self-pay

## 2023-10-11 DIAGNOSIS — Z1509 Genetic susceptibility to other malignant neoplasm: Secondary | ICD-10-CM

## 2023-10-11 NOTE — Telephone Encounter (Signed)
  Follow up Call-     10/10/2023    1:38 PM  Call back number  Post procedure Call Back phone  # [phone]  Permission to leave phone message Yes     Patient questions:  Do you have a fever, pain , or abdominal swelling? No. Pain Score  0 *  Have you tolerated food without any problems? Yes.    Have you been able to return to your normal activities? Yes.    Do you have any questions about your discharge instructions: Diet   No. Medications  No. Follow up visit  No.  Do you have questions or concerns about your Care? No.  Actions: * If pain score is 4 or above: No action needed, pain <4.

## 2023-10-11 NOTE — Telephone Encounter (Signed)
-----   Message from Ace Holder sent at 10/10/2023  4:56 PM EST ----- Regarding: dermatology referral [facility] can you please refer this patient to dermatology for history of Lynch syndrome. I told her also to see her Theatre manager. Thanks

## 2023-10-11 NOTE — Telephone Encounter (Signed)
 Ambulatory referral to Dermatology in Epic. MyChart message sent to patient with referral information.

## 2023-10-15 ENCOUNTER — Encounter: Payer: Self-pay | Admitting: Gastroenterology

## 2023-10-15 LAB — SURGICAL PATHOLOGY

## 2023-11-07 ENCOUNTER — Encounter: Payer: Self-pay | Admitting: Family Medicine

## 2023-11-07 ENCOUNTER — Ambulatory Visit (HOSPITAL_BASED_OUTPATIENT_CLINIC_OR_DEPARTMENT_OTHER)
Admission: RE | Admit: 2023-11-07 | Discharge: 2023-11-07 | Disposition: A | Discharge status: Home / Self Care | Source: Ambulatory Visit | Attending: Family Medicine | Admitting: Family Medicine

## 2023-11-07 ENCOUNTER — Ambulatory Visit (INDEPENDENT_AMBULATORY_CARE_PROVIDER_SITE_OTHER): Admitting: Family Medicine

## 2023-11-07 VITALS — BP 118/70 | HR 70 | Ht 68.0 in | Wt 154.8 lb

## 2023-11-07 DIAGNOSIS — M25561 Pain in right knee: Secondary | ICD-10-CM | POA: Diagnosis not present

## 2023-11-07 DIAGNOSIS — N898 Other specified noninflammatory disorders of vagina: Secondary | ICD-10-CM | POA: Diagnosis not present

## 2023-11-07 MED ORDER — FLUCONAZOLE 150 MG PO TABS: ORAL_TABLET | ORAL | 0 refills | Status: DC

## 2023-11-07 MED ORDER — METRONIDAZOLE 500 MG PO TABS: 500.0000 mg | ORAL_TABLET | Freq: Two times a day (BID) | ORAL | 0 refills | Status: AC

## 2023-11-07 NOTE — Progress Notes (Signed)
 Musculoskeletal Exam  Patient: Amy Reed DOB: August 29, 1986  DOS: 11/07/2023  SUBJECTIVE:  Chief Complaint:   Chief Complaint  Patient presents with   Acute Visit    Patient presents today for a follow-up on a fall.    Amy Reed is a 38 y.o.  female for evaluation and treatment of R knee pain.   Onset:  2.5weeks ago. Slipped hile pushing something and fell on bot knees, R worse Location: front of R knee Character:  aching and sharp  Progression of issue:  has slightly improved Associated symptoms: initially swelling and bruising, nothing now. OK range of motion; no redness Treatment: to date has been ice and OTC NSAIDS.   Neurovascular symptoms: no  Past Medical History:  Diagnosis Date   Allergy    Asthma    Family history of colon cancer    GERD (gastroesophageal reflux disease)    Lynch syndrome     Objective: VITAL SIGNS: BP 118/70   Pulse 70   Ht 5\' 8"  (1.727 m)   Wt 154 lb 12.8 oz (70.2 kg)   SpO2 99%   BMI 23.54 kg/m  Constitutional: Well formed, well developed. No acute distress. Thorax & Lungs: No accessory muscle use Musculoskeletal: knee.   Normal active range of motion: yes.   Normal passive range of motion: yes Tenderness to palpation: Yes over the R inferior patellar pole, patellar tendon, lateral femoral condyle and lateral tibial plateau Edema noted over the patellar tendon Mild TTP over the patellar tendon on the left. Deformity: no Ecchymosis: no Tests positive: None Tests negative: McMurray's, Stines, Lachman's, varus/valgus stress, patellar apprehension/grind Neurologic: Normal sensory function. Psychiatric: Normal mood. Age appropriate judgment and insight. Alert & oriented x 3.    Assessment:  Acute pain of right knee - Plan: DG Knee Complete 4 Views Right  Vaginal odor - Plan: fluconazole (DIFLUCAN) 150 MG tablet, metroNIDAZOLE (FLAGYL) 500 MG tablet  Plan: Stretches/exercises, heat, ice, Tylenol. Ck XR of R knee.  She  was instructed not to start the exercises until she hears from [place]. She gets yeast infections and BV after her cycles.  She is requesting a refill of the above medications which we will send.  Vaginal probiotic recommended. F/u as originally scheduled. The patient voiced understanding and agreement to the plan.   Jilda Roche [place], DO 11/07/23  8:23 AM

## 2023-11-07 NOTE — Patient Instructions (Signed)
Ice/cold pack over area for 10-15 min twice daily.  OK to take Tylenol 1000 mg (2 extra strength tabs) or 975 mg (3 regular strength tabs) every 6 hours as needed.  Let us know if you need anything.  Knee Exercises It is normal to feel mild stretching, pulling, tightness, or discomfort as you do these exercises, but you should stop right away if you feel sudden pain or your pain gets worse.  STRETCHING AND RANGE OF MOTION EXERCISES  These exercises warm up your muscles and joints and improve the movement and flexibility of your knee. These exercises also help to relieve pain, numbness, and tingling. Exercise A: Knee Extension, Prone  Lie on your abdomen on a bed. Place your left / right knee just beyond the edge of the surface so your knee is not on the bed. You can put a towel under your left / right thigh just above your knee for comfort. Relax your leg muscles and allow gravity to straighten your knee. You should feel a stretch behind your left / right knee. Hold this position for 30 seconds. Scoot up so your knee is supported between repetitions. Repeat 2 times. Complete this stretch 3 times per week. Exercise B: Knee Flexion, Active     Lie on your back with both knees straight. If this causes back discomfort, bend your left / right knee so your foot is flat on the floor. Slowly slide your left / right heel back toward your buttocks until you feel a gentle stretch in the front of your knee or thigh. Hold this position for 30 seconds. Slowly slide your left / right heel back to the starting position. Repeat 2 times. Complete this exercise 3 times per week. Exercise C: Quadriceps, Prone     Lie on your abdomen on a firm surface, such as a bed or padded floor. Bend your left / right knee and hold your ankle. If you cannot reach your ankle or pant leg, loop a belt around your foot and grab the belt instead. Gently pull your heel toward your buttocks. Your knee should not slide out to  the side. You should feel a stretch in the front of your thigh and knee. Hold this position for 30 seconds. Repeat 2 times. Complete this stretch 3 times per week. Exercise D: Hamstring, Supine  Lie on your back. Loop a belt or towel over the ball of your left / right foot. The ball of your foot is on the walking surface, right under your toes. Straighten your left / right knee and slowly pull on the belt to raise your leg until you feel a gentle stretch behind your knee. Do not let your left / right knee bend while you do this. Keep your other leg flat on the floor. Hold this position for 30 seconds. Repeat 2 times. Complete this stretch 3 times per week. STRENGTHENING EXERCISES  These exercises build strength and endurance in your knee. Endurance is the ability to use your muscles for a long time, even after they get tired. Exercise E: Quadriceps, Isometric     Lie on your back with your left / right leg extended and your other knee bent. Put a rolled towel or small pillow under your knee if told by your health care provider. Slowly tense the muscles in the front of your left / right thigh. You should see your kneecap slide up toward your hip or see increased dimpling just above the knee. This motion will push   the back of the knee toward the floor. For 3 seconds, keep the muscle as tight as you can without increasing your pain. Relax the muscles slowly and completely. Repeat for 10 total reps Repeat 2 ti mes. Complete this exercise 3 times per week. Exercise F: Straight Leg Raises - Quadriceps  Lie on your back with your left / right leg extended and your other knee bent. Tense the muscles in the front of your left / right thigh. You should see your kneecap slide up or see increased dimpling just above the knee. Your thigh may even shake a bit. Keep these muscles tight as you raise your leg 4-6 inches (10-15 cm) off the floor. Do not let your knee bend. Hold this position for 3  seconds. Keep these muscles tense as you lower your leg. Relax your muscles slowly and completely after each repetition. 10 total reps. Repeat 2 times. Complete this exercise 3 times per week.  Exercise G: Hamstring Curls     If told by your health care provider, do this exercise while wearing ankle weights. Begin with 5 lb weights (optional). Then increase the weight by 1 lb (0.5 kg) increments. Do not wear ankle weights that are more than 20 lbs to start with. Lie on your abdomen with your legs straight. Bend your left / right knee as far as you can without feeling pain. Keep your hips flat against the floor. Hold this position for 3 seconds. Slowly lower your leg to the starting position. Repeat for 10 reps.  Repeat 2 times. Complete this exercise 3 times per week. Exercise H: Squats (Quadriceps)  Stand in front of a table, with your feet and knees pointing straight ahead. You may rest your hands on the table for balance but not for support. Slowly bend your knees and lower your hips like you are going to sit in a chair. Keep your weight over your heels, not over your toes. Keep your lower legs upright so they are parallel with the table legs. Do not let your hips go lower than your knees. Do not bend lower than told by your health care provider. If your knee pain increases, do not bend as low. Hold the squat position for 1 second. Slowly push with your legs to return to standing. Do not use your hands to pull yourself to standing. Repeat 2 times. Complete this exercise 3 times per week. Exercise I: Wall Slides (Quadriceps)     Lean your back against a smooth wall or door while you walk your feet out 18-24 inches (46-61 cm) from it. Place your feet hip-width apart. Slowly slide down the wall or door until your knees Repeat 2 times. Complete this exercise every other day. Exercise K: Straight Leg Raises - Hip Abductors  Lie on your side with your left / right leg in the top  position. Lie so your head, shoulder, knee, and hip line up. You may bend your bottom knee to help you keep your balance. Roll your hips slightly forward so your hips are stacked directly over each other and your left / right knee is facing forward. Leading with your heel, lift your top leg 4-6 inches (10-15 cm). You should feel the muscles in your outer hip lifting. Do not let your foot drift forward. Do not let your knee roll toward the ceiling. Hold this position for 3 seconds. Slowly return your leg to the starting position. Let your muscles relax completely after each repetition. 10 total reps.   Repeat 2 times. Complete this exercise 3 times per week. Exercise J: Straight Leg Raises - Hip Extensors  Lie on your abdomen on a firm surface. You can put a pillow under your hips if that is more comfortable. Tense the muscles in your buttocks and lift your left / right leg about 4-6 inches (10-15 cm). Keep your knee straight as you lift your leg. Hold this position for 3 seconds. Slowly lower your leg to the starting position. Let your leg relax completely after each repetition. Repeat 2 times. Complete this exercise 3 times per week. Document Released: 07/05/2005 Document Revised: 05/15/2016 Document Reviewed: 06/27/2015 Elsevier Interactive Patient Education  2017 [employer]vier Inc.  

## 2023-11-18 ENCOUNTER — Encounter: Payer: Self-pay | Admitting: Family Medicine

## 2023-12-13 ENCOUNTER — Other Ambulatory Visit: Payer: Self-pay | Admitting: Family Medicine

## 2023-12-13 NOTE — Telephone Encounter (Signed)
 Copied from CRM [id number]. Topic: Clinical - Medication Refill >> Dec 13, 2023  3:06 PM Fredrich Romans wrote: Most Recent Primary Care Visit:  Provider: Sharlene Dory  Department: [facility]  Visit Type: OFFICE VISIT  Date: 11/07/2023  Medication: Vitamin D, Ergocalciferol, (DRISDOL) 1.25 MG (50000 UNIT) CAPS capsule  Has the patient contacted their pharmacy? Yes (Agent: If no, request that the patient contact the pharmacy for the refill. If patient does not wish to contact the pharmacy document the reason why and proceed with request.) (Agent: If yes, when and what did the pharmacy advise?)  Is this the correct pharmacy for this prescription? Yes If no, delete pharmacy and type the correct one.  This is the patient's preferred pharmacy:  [id number] [place], [place] - [address] [facility] [address] [place] [address] Phone: [phone] Fax: [phone]   Has the prescription been filled recently? No  Is the patient out of the medication? Yes  Has the patient been seen for an appointment in the last year OR does the patient have an upcoming appointment? Yes  Can we respond through MyChart? Yes  Agent: Please be advised that Rx refills may take up to 3 business days. We ask that you follow-up with your pharmacy.

## 2023-12-14 ENCOUNTER — Other Ambulatory Visit: Payer: Self-pay

## 2023-12-14 DIAGNOSIS — E559 Vitamin D deficiency, unspecified: Secondary | ICD-10-CM

## 2023-12-14 NOTE — Telephone Encounter (Signed)
 Patient was advised and scheduled appointment for 12/18/23.

## 2023-12-18 ENCOUNTER — Other Ambulatory Visit (INDEPENDENT_AMBULATORY_CARE_PROVIDER_SITE_OTHER)

## 2023-12-18 ENCOUNTER — Encounter: Payer: Self-pay | Admitting: Family Medicine

## 2023-12-18 DIAGNOSIS — E559 Vitamin D deficiency, unspecified: Secondary | ICD-10-CM | POA: Diagnosis not present

## 2023-12-18 LAB — VITAMIN D 25 HYDROXY (VIT D DEFICIENCY, FRACTURES): VITD: 21.46 ng/mL — ABNORMAL LOW (ref 30.00–100.00)

## 2023-12-20 ENCOUNTER — Other Ambulatory Visit: Payer: Self-pay | Admitting: Family Medicine

## 2023-12-20 MED ORDER — VITAMIN D (ERGOCALCIFEROL) 1.25 MG (50000 UNIT) PO CAPS: 50000.0000 [IU] | ORAL_CAPSULE | ORAL | 0 refills | Status: DC

## 2024-01-02 ENCOUNTER — Ambulatory Visit
Admission: RE | Admit: 2024-01-02 | Discharge: 2024-01-02 | Disposition: A | Discharge status: Home / Self Care | Source: Ambulatory Visit | Attending: Physician Assistant | Admitting: Physician Assistant

## 2024-01-02 ENCOUNTER — Ambulatory Visit: Payer: Self-pay

## 2024-01-02 VITALS — BP 119/71 | HR 82 | Temp 98.2°F | Resp 17

## 2024-01-02 DIAGNOSIS — J4531 Mild persistent asthma with (acute) exacerbation: Secondary | ICD-10-CM

## 2024-01-02 DIAGNOSIS — Z889 Allergy status to unspecified drugs, medicaments and biological substances status: Secondary | ICD-10-CM

## 2024-01-02 DIAGNOSIS — J4530 Mild persistent asthma, uncomplicated: Secondary | ICD-10-CM

## 2024-01-02 MED ORDER — ALBUTEROL SULFATE HFA 108 (90 BASE) MCG/ACT IN AERS: 2.0000 | INHALATION_SPRAY | Freq: Four times a day (QID) | RESPIRATORY_TRACT | 0 refills | Status: DC | PRN

## 2024-01-02 MED ORDER — EPINEPHRINE 0.3 MG/0.3ML IJ SOAJ
0.3000 mg | INTRAMUSCULAR | 0 refills | Status: AC | PRN
Start: 2024-01-02 — End: ?

## 2024-01-02 MED ORDER — AZITHROMYCIN 250 MG PO TABS: 250.0000 mg | ORAL_TABLET | Freq: Every day | ORAL | 0 refills | Status: DC

## 2024-01-02 MED ORDER — ALBUTEROL SULFATE (2.5 MG/3ML) 0.083% IN NEBU: 2.5000 mg | INHALATION_SOLUTION | Freq: Four times a day (QID) | RESPIRATORY_TRACT | 12 refills | Status: AC | PRN

## 2024-01-02 MED ORDER — PREDNISONE 20 MG PO TABS: 40.0000 mg | ORAL_TABLET | Freq: Every day | ORAL | 0 refills | Status: AC

## 2024-01-02 NOTE — ED Triage Notes (Signed)
 Pt c/o chest congestion, SOB for the last few weeks. She has asthma and has been using inhaler 5-6times a day  She was asking for neb machine to take home.

## 2024-01-02 NOTE — ED Provider Notes (Signed)
 Amy Reed    CSN: [id number] Arrival date & time: 01/02/24  1214      History   Chief Complaint Chief Complaint  Patient presents with   Wheezing    Entered by patient    HPI Amy Reed is a 38 y.o. female.   Patient presents today for possible asthma exacerbation.  She reports that she has had chest congestion and shortness of breath for the last few weeks that seems to be worsening with time.  She does not report any fever.  She notes she has had to use her rescue inhaler more frequently, up to 5-6 times per day.  She request nebulizer machine to take home if possible.  She also requests refill of her EpiPen  and albuterol  inhaler.  She does have appointment scheduled with her allergist in the next few weeks.  The history is provided by the patient.  Wheezing Associated symptoms: cough and sore throat   Associated symptoms: no ear pain, no fever and no shortness of breath     Past Medical History:  Diagnosis Date   Allergy     Asthma    Family history of colon cancer    GERD (gastroesophageal reflux disease)    Lynch syndrome     Patient Active Problem List   Diagnosis Date Noted   Mild persistent asthma 03/21/2022   Constipation 10/19/2021   Family hx of colon cancer requiring screening colonoscopy 09/11/2017   Globus sensation 09/11/2017   Gastroesophageal reflux disease without esophagitis 08/07/2017    Past Surgical History:  Procedure Laterality Date   24 HOUR PH STUDY N/A 10/10/2017   Procedure: 24 HOUR PH STUDY-OFF of PPI;  Surgeon: Amy Dandy, MD;  Location: [facility];  Service: Endoscopy;  Laterality: N/A;   DILATION AND CURETTAGE OF UTERUS     ESOPHAGEAL MANOMETRY N/A 10/10/2017   Procedure: ESOPHAGEAL MANOMETRY (EM);  Surgeon: Amy Dandy, MD;  Location: [facility];  Service: Endoscopy;  Laterality: N/A;   PH IMPEDANCE STUDY N/A 10/10/2017   Procedure: PH IMPEDANCE STUDY-OFF of PPI;  Surgeon: Amy Dandy, MD;   Location: [facility];  Service: Endoscopy;  Laterality: N/A;    OB History     Gravida  2   Para  1   Term      Preterm  1   AB  1   Living  1      SAB  1   IAB      Ectopic      Multiple      Live Births  1            Home Medications    Prior to Admission medications   Medication Sig Start Date End Date Taking? Authorizing Provider  albuterol  (PROVENTIL ) (2.5 MG/3ML) 0.083% nebulizer solution Take 3 mLs (2.5 mg total) by nebulization every 6 (six) hours as needed for wheezing or shortness of breath. 01/02/24  Yes Vernestine Gondola, PA-C  azithromycin (ZITHROMAX) 250 MG tablet Take 1 tablet (250 mg total) by mouth daily. Take first 2 tablets together, then 1 every day until finished. 01/02/24  Yes Vernestine Gondola, PA-C  predniSONE  (DELTASONE ) 20 MG tablet Take 2 tablets (40 mg total) by mouth daily with breakfast for 5 days. 01/02/24 01/07/24 Yes Vernestine Gondola, PA-C  albuterol  (VENTOLIN  HFA) 108 (90 Base) MCG/ACT inhaler Inhale 2 puffs into the lungs every 6 (six) hours as needed for wheezing or shortness of breath. 01/02/24   Aisha Ali,  Vara Gentle, PA-C  EPINEPHrine  (EPIPEN  2-PAK) 0.3 mg/0.3 mL IJ SOAJ injection Inject 0.3 mg into the muscle as needed for anaphylaxis. 01/02/24   Vernestine Gondola, PA-C  fluconazole  (DIFLUCAN ) 150 MG tablet Take 1 tab every 72 hours 11/07/23   Wendling, Shellie Dials, DO  fluticasone  (FLOVENT  HFA) 110 MCG/ACT inhaler Rinse mouth out after use. Take 2 puffs twice daily when in yellow zone. 06/05/22   Jobe Mulder, DO  levocetirizine (XYZAL ) 5 MG tablet Take 1 tablet (5 mg total) by mouth every evening. 03/21/22   Jobe Mulder, DO  montelukast  (SINGULAIR ) 10 MG tablet Take 1 tablet (10 mg total) by mouth at bedtime. 03/21/22   Jobe Mulder, DO  tinidazole  (TINDAMAX ) 500 MG tablet Take 2 tablets (1,000 mg total) by mouth daily. 07/09/23   Constant, Peggy, MD  Vitamin D , Ergocalciferol , (DRISDOL ) 1.25 MG (50000 UNIT)  CAPS capsule Take 1 capsule (50,000 Units total) by mouth every 7 (seven) days. 12/20/23   Jobe Mulder, DO    Family History Family History  Problem Relation Age of Onset   Colon cancer Father 74   Colon polyps Sister    Cervical polyp Sister    Colon cancer Paternal Aunt    Breast cancer Paternal Aunt    Colon cancer Paternal Aunt    Diabetes Maternal Grandmother    Diabetes Maternal Grandfather    Breast cancer Paternal Grandmother    Diabetes Paternal Grandmother    Cervical cancer Paternal Grandmother    Diabetes Paternal Grandfather    Colon cancer Paternal Grandfather    Colon polyps Brother    Esophageal cancer Neg Hx    Stomach cancer Neg Hx    Rectal cancer Neg Hx     Social History Social History   Tobacco Use   Smoking status: Some Days    Types: Cigarettes   Smokeless tobacco: Never  Vaping Use   Vaping status: Never Used  Substance Use Topics   Alcohol use: Yes    Comment: 2-3 times a month.    Drug use: No     Allergies   Shellfish allergy  and Latex   Review of Systems Review of Systems  Constitutional:  Negative for chills and fever.  HENT:  Positive for congestion and sore throat. Negative for ear pain.   Eyes:  Negative for discharge and redness.  Respiratory:  Positive for cough and wheezing. Negative for shortness of breath.   Gastrointestinal:  Negative for abdominal pain, diarrhea, nausea and vomiting.     Physical Exam Triage Vital Signs ED Triage Vitals  Encounter Vitals Group     BP 01/02/24 1221 119/71     Systolic BP Percentile --      Diastolic BP Percentile --      Pulse Rate 01/02/24 1221 82     Resp 01/02/24 1221 17     Temp 01/02/24 1221 98.2 F (36.8 C)     Temp Source 01/02/24 1221 Oral     SpO2 01/02/24 1221 99 %     Weight --      Height --      Head Circumference --      Peak Flow --      Pain Score 01/02/24 1222 0     Pain Loc --      Pain Education --      Exclude from Growth Chart --    No  data found.  Updated Vital Signs BP 119/71 (BP Location: Right  Arm)   Pulse 82   Temp 98.2 F (36.8 C) (Oral)   Resp 17   LMP 12/25/2023 (Approximate)   SpO2 99%   Visual Acuity Right Eye Distance:   Left Eye Distance:   Bilateral Distance:    Right Eye Near:   Left Eye Near:    Bilateral Near:     Physical Exam Vitals and nursing note reviewed.  Constitutional:      General: She is not in acute distress.    Appearance: Normal appearance. She is not ill-appearing.  HENT:     Head: Normocephalic and atraumatic.     Nose: No congestion or rhinorrhea.     Mouth/Throat:     Mouth: Mucous membranes are moist.     Pharynx: No oropharyngeal exudate or posterior oropharyngeal erythema.     Comments: PND noted Eyes:     Conjunctiva/sclera: Conjunctivae normal.  Cardiovascular:     Rate and Rhythm: Normal rate and regular rhythm.     Heart sounds: Normal heart sounds. No murmur heard. Pulmonary:     Effort: Pulmonary effort is normal. No respiratory distress.     Breath sounds: Normal breath sounds. No wheezing, rhonchi or rales.  Skin:    General: Skin is warm and dry.  Neurological:     Mental Status: She is alert.  Psychiatric:        Mood and Affect: Mood normal.        Thought Content: Thought content normal.      Reed Treatments / Results  Labs (all labs ordered are listed, but only abnormal results are displayed) Labs Reviewed - No data to display  EKG   Radiology No results found.  Procedures Procedures (including critical care time)  Medications Ordered in Reed Medications - No data to display  Initial Impression / Assessment and Plan / Reed Course  I have reviewed the triage vital signs and the nursing notes.  Pertinent labs & imaging results that were available during my care of the patient were reviewed by me and considered in my medical decision making (see chart for details).    Will treat to cover asthma exacerbation with steroid burst and  Z-Pak.  Nebulizer machine given in office and albuterol  refilled both for nebulizer as well as her rescue inhaler.  EpiPen  refilled as requested.  Encouraged patient to keep appointment with allergist as scheduled.  Encouraged sooner follow-up with any further concerns.  Final Clinical Impressions(s) / Reed Diagnoses   Final diagnoses:  History of allergic reaction  Mild persistent asthma with acute exacerbation   Discharge Instructions   None    ED Prescriptions     Medication Sig Dispense Auth. Provider   predniSONE  (DELTASONE ) 20 MG tablet Take 2 tablets (40 mg total) by mouth daily with breakfast for 5 days. 10 tablet Jami Mcclintock F, PA-C   azithromycin (ZITHROMAX) 250 MG tablet Take 1 tablet (250 mg total) by mouth daily. Take first 2 tablets together, then 1 every day until finished. 6 tablet Jami Mcclintock F, PA-C   albuterol  (PROVENTIL ) (2.5 MG/3ML) 0.083% nebulizer solution Take 3 mLs (2.5 mg total) by nebulization every 6 (six) hours as needed for wheezing or shortness of breath. 75 mL Jami Mcclintock F, PA-C   EPINEPHrine  (EPIPEN  2-PAK) 0.3 mg/0.3 mL IJ SOAJ injection Inject 0.3 mg into the muscle as needed for anaphylaxis. 1 each Vernestine Gondola, PA-C   albuterol  (VENTOLIN  HFA) 108 (90 Base) MCG/ACT inhaler Inhale 2 puffs into the lungs every  6 (six) hours as needed for wheezing or shortness of breath. 18 g Vernestine Gondola, PA-C      PDMP not reviewed this encounter.   Vernestine Gondola, PA-C 01/02/24 1239

## 2024-02-21 ENCOUNTER — Ambulatory Visit: Admitting: Dermatology

## 2024-04-18 ENCOUNTER — Ambulatory Visit (INDEPENDENT_AMBULATORY_CARE_PROVIDER_SITE_OTHER): Admitting: Family Medicine

## 2024-04-18 VITALS — BP 132/80 | HR 79 | Temp 98.0°F | Resp 18 | Ht 68.0 in | Wt 159.0 lb

## 2024-04-18 DIAGNOSIS — E559 Vitamin D deficiency, unspecified: Secondary | ICD-10-CM

## 2024-04-18 DIAGNOSIS — Z13220 Encounter for screening for lipoid disorders: Secondary | ICD-10-CM

## 2024-04-18 DIAGNOSIS — Z0001 Encounter for general adult medical examination with abnormal findings: Secondary | ICD-10-CM | POA: Diagnosis not present

## 2024-04-18 DIAGNOSIS — R0681 Apnea, not elsewhere classified: Secondary | ICD-10-CM

## 2024-04-18 DIAGNOSIS — B9689 Other specified bacterial agents as the cause of diseases classified elsewhere: Secondary | ICD-10-CM | POA: Diagnosis not present

## 2024-04-18 DIAGNOSIS — N760 Acute vaginitis: Secondary | ICD-10-CM | POA: Diagnosis not present

## 2024-04-18 DIAGNOSIS — Z0000 Encounter for general adult medical examination without abnormal findings: Secondary | ICD-10-CM

## 2024-04-18 DIAGNOSIS — Z9109 Other allergy status, other than to drugs and biological substances: Secondary | ICD-10-CM | POA: Insufficient documentation

## 2024-04-18 LAB — COMPREHENSIVE METABOLIC PANEL WITH GFR
ALT: 10 U/L (ref 0–35)
AST: 13 U/L (ref 0–37)
Albumin: 4.3 g/dL (ref 3.5–5.2)
Alkaline Phosphatase: 51 U/L (ref 39–117)
BUN: 13 mg/dL (ref 6–23)
CO2: 27 meq/L (ref 19–32)
Calcium: 9.6 mg/dL (ref 8.4–10.5)
Chloride: 103 meq/L (ref 96–112)
Creatinine, Ser: 0.87 mg/dL (ref 0.40–1.20)
GFR: 84.35 mL/min (ref >60.00)
Glucose, Bld: 79 mg/dL (ref 70–99)
Potassium: 4.2 meq/L (ref 3.5–5.1)
Sodium: 138 meq/L (ref 135–145)
Total Bilirubin: 0.5 mg/dL (ref 0.2–1.2)
Total Protein: 7.1 g/dL (ref 6.0–8.3)

## 2024-04-18 LAB — LIPID PANEL
Cholesterol: 145 mg/dL (ref 0–200)
HDL: 55 mg/dL (ref >39.00)
LDL Cholesterol: 74 mg/dL (ref 0–99)
NonHDL: 89.9
Total CHOL/HDL Ratio: 3
Triglycerides: 80 mg/dL (ref 0.0–149.0)
VLDL: 16 mg/dL (ref 0.0–40.0)

## 2024-04-18 LAB — CBC
HCT: 41.3 % (ref 36.0–46.0)
Hemoglobin: 13.6 g/dL (ref 12.0–15.0)
MCHC: 33 g/dL (ref 30.0–36.0)
MCV: 99 fl (ref 78.0–100.0)
Platelets: 204 K/uL (ref 150.0–400.0)
RBC: 4.17 Mil/uL (ref 3.87–5.11)
RDW: 12.5 % (ref 11.5–15.5)
WBC: 4 K/uL (ref 4.0–10.5)

## 2024-04-18 LAB — VITAMIN D 25 HYDROXY (VIT D DEFICIENCY, FRACTURES): VITD: 30.82 ng/mL (ref 30.00–100.00)

## 2024-04-18 MED ORDER — FLUTICASONE PROPIONATE 50 MCG/ACT NA SUSP
2.0000 | Freq: Every day | NASAL | 6 refills | Status: DC
Start: 2024-04-18 — End: 2024-06-17

## 2024-04-18 MED ORDER — LEVOCETIRIZINE DIHYDROCHLORIDE 5 MG PO TABS: 5.0000 mg | ORAL_TABLET | Freq: Every evening | ORAL | 2 refills | Status: DC

## 2024-04-18 MED ORDER — METRONIDAZOLE 500 MG PO TABS: 500.0000 mg | ORAL_TABLET | Freq: Two times a day (BID) | ORAL | 0 refills | Status: AC

## 2024-04-18 MED ORDER — FLUCONAZOLE 150 MG PO TABS: ORAL_TABLET | ORAL | 0 refills | Status: DC

## 2024-04-18 MED ORDER — VITAMIN D (ERGOCALCIFEROL) 1.25 MG (50000 UNIT) PO CAPS: 50000.0000 [IU] | ORAL_CAPSULE | ORAL | 0 refills | Status: DC

## 2024-04-18 NOTE — Patient Instructions (Addendum)
 Please think about the pneumonia vaccine.   Keep the diet clean and stay active.  Give us  2-3 business days to get the results of your labs back.   Please get me a copy of your advanced directive form at your convenience.   Consider 100-200 mcg daily of Vit K2 OTC.  If you do not hear anything about your referral in the next 1-2 weeks, call our office and ask for an update.  I recommend getting the flu shot in mid October. This suggestion would change if the CDC comes out with a different recommendation.   Let us  know if you need anything.

## 2024-04-18 NOTE — Progress Notes (Signed)
 Chief Complaint  Patient presents with   Annual Exam     Well Woman Amy Reed is here for a complete physical.   Her last physical was >1 year ago.  Current diet: in general, a healthy diet. Current exercise: none. Fatigue out of ordinary? Yes- hx of low Vit D Seatbelt? Yes Advanced directive? No  Health Maintenance Pap/HPV- Yes Tetanus- Yes HIV screening- Yes Hep C screening- Yes  Patient has a history of recurrent BV.  She tried a vaginal probiotic without relief.  She has an appointment with a GYN in a couple weeks.  She has BV again with discharge and irritation.  No new sexual partners.  Denies any fevers or urinary complaints.  Past Medical History:  Diagnosis Date   Allergy     Asthma    Family history of colon cancer    GERD (gastroesophageal reflux disease)    Lynch syndrome      Past Surgical History:  Procedure Laterality Date   33 HOUR PH STUDY N/A 10/10/2017   Procedure: 24 HOUR PH STUDY-OFF of PPI;  Surgeon: Shila Gustav GAILS, MD;  Location: [facility];  Service: Endoscopy;  Laterality: N/A;   DILATION AND CURETTAGE OF UTERUS     ESOPHAGEAL MANOMETRY N/A 10/10/2017   Procedure: ESOPHAGEAL MANOMETRY (EM);  Surgeon: Shila Gustav GAILS, MD;  Location: [facility];  Service: Endoscopy;  Laterality: N/A;   PH IMPEDANCE STUDY N/A 10/10/2017   Procedure: PH IMPEDANCE STUDY-OFF of PPI;  Surgeon: Shila Gustav GAILS, MD;  Location: [facility];  Service: Endoscopy;  Laterality: N/A;    Medications  Current Outpatient Medications on File Prior to Visit  Medication Sig Dispense Refill   albuterol  (PROVENTIL ) (2.5 MG/3ML) 0.083% nebulizer solution Take 3 mLs (2.5 mg total) by nebulization every 6 (six) hours as needed for wheezing or shortness of breath. 75 mL 12   albuterol  (VENTOLIN  HFA) 108 (90 Base) MCG/ACT inhaler Inhale 2 puffs into the lungs every 6 (six) hours as needed for wheezing or shortness of breath. 18 g 0   EPINEPHrine  (EPIPEN  2-PAK) 0.3  mg/0.3 mL IJ SOAJ injection Inject 0.3 mg into the muscle as needed for anaphylaxis. 1 each 0   fluticasone  (FLOVENT  HFA) 110 MCG/ACT inhaler Rinse mouth out after use. Take 2 puffs twice daily when in yellow zone. 1 each 1   levocetirizine (XYZAL ) 5 MG tablet Take 1 tablet (5 mg total) by mouth every evening. 30 tablet 2   montelukast  (SINGULAIR ) 10 MG tablet Take 1 tablet (10 mg total) by mouth at bedtime. 30 tablet 3   tinidazole  (TINDAMAX ) 500 MG tablet Take 2 tablets (1,000 mg total) by mouth daily. 10 tablet 0    Allergies Allergies  Allergen Reactions   Shellfish Allergy  Anaphylaxis   Latex Itching    Review of Systems: Constitutional:  no unexpected weight changes Eye:  no recent significant change in vision Ear/Nose/Mouth/Throat:  Ears:  no tinnitus or vertigo and no recent change in hearing Nose/Mouth/Throat:  no complaints of nasal congestion, no sore throat Cardiovascular: no chest pain Respiratory:  no cough and no shortness of breath Gastrointestinal:  no abdominal pain, no change in bowel habits GU:  Female: +DC Musculoskeletal/Extremities:  no new pain of the joints Integumentary (Skin/Breast):  no abnormal skin lesions reported Neurologic:  no headaches Endocrine:  +fatigue Hematologic/Lymphatic:  No areas of easy bleeding  Exam BP 132/80   Pulse 79   Temp 98 F (36.7 C)   Resp 18   Ht  5' 8 (1.727 m)   Wt 159 lb (72.1 kg)   LMP 04/05/2024 (Approximate)   SpO2 98%   BMI 24.18 kg/m  General:  well developed, well nourished, in no apparent distress Skin:  no significant moles, warts, or growths Head:  no masses, lesions, or tenderness Eyes:  pupils equal and round, sclera anicteric without injection Ears:  canals without lesions, TMs shiny without retraction, no obvious effusion, no erythema Nose:  nares patent, mucosa normal, and no drainage  Throat/Pharynx:  lips and gingiva without lesion; tongue and uvula midline; non-inflamed pharynx; no exudates or  postnasal drainage Neck: neck supple without adenopathy, thyromegaly, or masses Lungs:  clear to auscultation, breath sounds equal bilaterally, no respiratory distress Cardio:  regular rate and rhythm, no bruits, no LE edema Abdomen:  abdomen soft, nontender; bowel sounds normal; no masses or organomegaly Genital: Defer to GYN Musculoskeletal:  symmetrical muscle groups noted without atrophy or deformity Extremities:  no clubbing, cyanosis, or edema, no deformities, no skin discoloration Neuro:  gait normal; deep tendon reflexes normal and symmetric Psych: well oriented with normal range of affect and appropriate judgment/insight  Assessment and Plan  Well adult exam - Plan: CBC, Comprehensive metabolic panel with GFR, Lipid panel  Vitamin D  deficiency - Plan: VITAMIN D  25 Hydroxy (Vit-D Deficiency, Fractures)  Witnessed apneic spells - Plan: Ambulatory referral to Neurology  Environmental allergies - Plan: Ambulatory referral to Allergy   BV (bacterial vaginosis) - Plan: fluconazole  (DIFLUCAN ) 150 MG tablet, metroNIDAZOLE  (FLAGYL ) 500 MG tablet   Well 38 y.o. female. Counseled on diet and exercise. Advanced directive form provided today.  PCV20 politely declined. She would like to think about it.  BV: Recurrent issue.  Flagyl  500 mg twice daily for 7 days.  She usually gets a yeast infection afterwards so we will send in Diflucan  also.  She has an appointment with the gynecology team coming up. Witnessed apneic episodes: We will refer to neurology again for OSA evaluation. Vitamin D : Check today, probably will need to refill her supplement. Allergies: Refer back to the allergy  team.  Change loratadine to levocetirizine.  Restart Flonase . Follow up in 1 year. The patient voiced understanding and agreement to the plan.  Amy Reed [place], DO 04/18/24 11:49 AM

## 2024-04-19 ENCOUNTER — Ambulatory Visit: Payer: Self-pay | Admitting: Family Medicine

## 2024-04-22 ENCOUNTER — Encounter

## 2024-05-01 ENCOUNTER — Ambulatory Visit: Admitting: Obstetrics and Gynecology

## 2024-05-07 ENCOUNTER — Other Ambulatory Visit (HOSPITAL_COMMUNITY)
Admission: RE | Admit: 2024-05-07 | Discharge: 2024-05-07 | Disposition: A | Discharge status: Home / Self Care | Source: Ambulatory Visit | Attending: Advanced Practice Midwife | Admitting: Advanced Practice Midwife

## 2024-05-07 ENCOUNTER — Encounter: Payer: Self-pay | Admitting: Obstetrics & Gynecology

## 2024-05-07 ENCOUNTER — Ambulatory Visit (INDEPENDENT_AMBULATORY_CARE_PROVIDER_SITE_OTHER): Admitting: Obstetrics & Gynecology

## 2024-05-07 VITALS — BP 132/80 | HR 74 | Ht 68.0 in | Wt 162.1 lb

## 2024-05-07 DIAGNOSIS — B9689 Other specified bacterial agents as the cause of diseases classified elsewhere: Secondary | ICD-10-CM

## 2024-05-07 DIAGNOSIS — Z01419 Encounter for gynecological examination (general) (routine) without abnormal findings: Secondary | ICD-10-CM

## 2024-05-07 DIAGNOSIS — N760 Acute vaginitis: Secondary | ICD-10-CM

## 2024-05-07 DIAGNOSIS — Z148 Genetic carrier of other disease: Secondary | ICD-10-CM | POA: Diagnosis not present

## 2024-05-07 NOTE — Progress Notes (Signed)
 Pt presents for annual. Pt would like to talk about getting a hysterectomy.

## 2024-05-07 NOTE — Progress Notes (Signed)
 Patient ID: Amy Reed, female   DOB: 07-30-1986, 38 y.o.   MRN: [id number]  Chief Complaint  Patient presents with   Annual Exam    HPI Amy Reed is a 38 y.o. female.  [id number] Patient's last menstrual period was 04/28/2024 (exact date). She has regular menses that are heavy. 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 As she has menstrual problems and she wants no more pregnancy she is interested in prophylactic hysterectomy HPI  Past Medical History:  Diagnosis Date   Allergy     Asthma    Family history of colon cancer    GERD (gastroesophageal reflux disease)    Lynch syndrome     Past Surgical History:  Procedure Laterality Date   53 HOUR PH STUDY N/A 10/10/2017   Procedure: 24 HOUR PH STUDY-OFF of PPI;  Surgeon: Shila Gustav GAILS, MD;  Location: [facility];  Service: Endoscopy;  Laterality: N/A;   DILATION AND CURETTAGE OF UTERUS     ESOPHAGEAL MANOMETRY N/A 10/10/2017   Procedure: ESOPHAGEAL MANOMETRY (EM);  Surgeon: Shila Gustav GAILS, MD;  Location: [facility];  Service: Endoscopy;  Laterality: N/A;   PH IMPEDANCE STUDY N/A 10/10/2017   Procedure: PH IMPEDANCE STUDY-OFF of PPI;  Surgeon: Shila Gustav GAILS, MD;  Location: [facility];  Service: Endoscopy;  Laterality: N/A;    Family History  Problem Relation Age of Onset   Colon cancer Father 64   Colon polyps Sister    Cervical polyp Sister    Colon cancer Paternal Aunt    Breast cancer Paternal Aunt    Colon cancer Paternal Aunt    Diabetes Maternal Grandmother    Diabetes Maternal Grandfather    Breast cancer Paternal Grandmother    Diabetes Paternal Grandmother    Cervical cancer Paternal Grandmother    Diabetes Paternal Grandfather    Colon cancer Paternal Grandfather    Colon polyps Brother    Esophageal cancer Neg Hx    Stomach cancer Neg Hx    Rectal cancer Neg Hx     Social History Social History   Tobacco Use   Smoking status: Some Days     Types: Cigarettes   Smokeless tobacco: Never  Vaping Use   Vaping status: Never Used  Substance Use Topics   Alcohol use: Yes    Comment: 2-3 times a week.   Drug use: No    Allergies  Allergen Reactions   Shellfish Allergy  Anaphylaxis   Latex Itching    Current Outpatient Medications  Medication Sig Dispense Refill   albuterol  (PROVENTIL ) (2.5 MG/3ML) 0.083% nebulizer solution Take 3 mLs (2.5 mg total) by nebulization every 6 (six) hours as needed for wheezing or shortness of breath. (Patient not taking: Reported on 05/07/2024) 75 mL 12   albuterol  (VENTOLIN  HFA) 108 (90 Base) MCG/ACT inhaler Inhale 2 puffs into the lungs every 6 (six) hours as needed for wheezing or shortness of breath. (Patient not taking: Reported on 05/07/2024) 18 g 0   EPINEPHrine  (EPIPEN  2-PAK) 0.3 mg/0.3 mL IJ SOAJ injection Inject 0.3 mg into the muscle as needed for anaphylaxis. (Patient not taking: Reported on 05/07/2024) 1 each 0   fluconazole  (DIFLUCAN ) 150 MG tablet Take 1 tab, repeat in 72 hours if no improvement. (Patient not taking: Reported on 05/07/2024) 2 tablet 0   fluticasone  (FLONASE ) 50 MCG/ACT nasal spray Place 2 sprays into both nostrils daily. (Patient not taking: Reported on 05/07/2024) 16 g 6  fluticasone  (FLOVENT  HFA) 110 MCG/ACT inhaler Rinse mouth out after use. Take 2 puffs twice daily when in yellow zone. (Patient not taking: Reported on 05/07/2024) 1 each 1   levocetirizine (XYZAL ) 5 MG tablet Take 1 tablet (5 mg total) by mouth every evening. (Patient not taking: Reported on 05/07/2024) 30 tablet 2   Vitamin D , Ergocalciferol , (DRISDOL ) 1.25 MG (50000 UNIT) CAPS capsule Take 1 capsule (50,000 Units total) by mouth every 7 (seven) days. (Patient not taking: Reported on 05/07/2024) 12 capsule 0   No current facility-administered medications for this visit.    Review of Systems Review of Systems  Constitutional: Negative.   Respiratory: Negative.    Cardiovascular: Negative.    Gastrointestinal: Negative.   Genitourinary:  Positive for menstrual problem, pelvic pain and vaginal discharge.  Musculoskeletal: Negative.     Blood pressure 132/80, pulse 74, height 5' 8 (1.727 m), weight 162 lb 1.6 oz (73.5 kg), last menstrual period 04/28/2024.  Physical Exam Physical Exam Vitals and nursing note reviewed. Exam conducted with a chaperone present.  Constitutional:      Appearance: Normal appearance.  HENT:     Head: Normocephalic and atraumatic.  Cardiovascular:     Rate and Rhythm: Normal rate.  Pulmonary:     Effort: Pulmonary effort is normal.  Chest:  Breasts:    Right: Normal.     Left: Normal.  Abdominal:     General: Abdomen is flat.     Palpations: Abdomen is soft.  Genitourinary:    General: Normal vulva.     Exam position: Lithotomy position.     Vagina: Normal.     Cervix: Normal.     Uterus: Normal.      Adnexa: Right adnexa normal and left adnexa normal.  Musculoskeletal:     Cervical back: Normal range of motion.  Skin:    General: Skin is warm and dry.  Neurological:     General: No focal deficit present.     Mental Status: She is alert.  Psychiatric:        Mood and Affect: Mood normal.        Behavior: Behavior normal.     Data Reviewed Lynch panel positive  Assessment Well woman exam with routine gynecological exam - Plan: Cervicovaginal ancillary only( 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

## 2024-05-08 ENCOUNTER — Other Ambulatory Visit: Payer: Self-pay | Admitting: Family Medicine

## 2024-05-08 DIAGNOSIS — B9689 Other specified bacterial agents as the cause of diseases classified elsewhere: Secondary | ICD-10-CM

## 2024-05-08 LAB — CERVICOVAGINAL ANCILLARY ONLY
Bacterial Vaginitis (gardnerella): POSITIVE — AB
Candida Glabrata: NEGATIVE
Candida Vaginitis: POSITIVE — AB
Chlamydia: NEGATIVE
Comment: NEGATIVE
Comment: NEGATIVE
Comment: NEGATIVE
Comment: NEGATIVE
Comment: NEGATIVE
Comment: NORMAL
Neisseria Gonorrhea: NEGATIVE
Trichomonas: NEGATIVE

## 2024-05-08 LAB — HEPATITIS B SURFACE ANTIGEN: Hepatitis B Surface Ag: NEGATIVE

## 2024-05-08 LAB — HEPATITIS C ANTIBODY: Hep C Virus Ab: NONREACTIVE

## 2024-05-08 LAB — RPR: RPR Ser Ql: NONREACTIVE

## 2024-05-08 LAB — HIV ANTIBODY (ROUTINE TESTING W REFLEX): HIV Screen 4th Generation wRfx: NONREACTIVE

## 2024-05-09 ENCOUNTER — Other Ambulatory Visit: Payer: Self-pay | Admitting: Obstetrics & Gynecology

## 2024-05-09 MED ORDER — METRONIDAZOLE 500 MG PO TABS: 500.0000 mg | ORAL_TABLET | Freq: Two times a day (BID) | ORAL | 0 refills | Status: DC

## 2024-05-09 MED ORDER — FLUCONAZOLE 150 MG PO TABS: 150.0000 mg | ORAL_TABLET | Freq: Once | ORAL | 0 refills | Status: AC

## 2024-05-20 ENCOUNTER — Encounter: Payer: Self-pay | Admitting: Neurology

## 2024-05-20 ENCOUNTER — Ambulatory Visit (INDEPENDENT_AMBULATORY_CARE_PROVIDER_SITE_OTHER): Admitting: Neurology

## 2024-05-20 VITALS — BP 118/80 | HR 81 | Ht 67.5 in | Wt 161.4 lb

## 2024-05-20 DIAGNOSIS — R5383 Other fatigue: Secondary | ICD-10-CM

## 2024-05-20 DIAGNOSIS — R519 Headache, unspecified: Secondary | ICD-10-CM

## 2024-05-20 DIAGNOSIS — G4719 Other hypersomnia: Secondary | ICD-10-CM | POA: Diagnosis not present

## 2024-05-20 DIAGNOSIS — Z9189 Other specified personal risk factors, not elsewhere classified: Secondary | ICD-10-CM | POA: Diagnosis not present

## 2024-05-20 DIAGNOSIS — R0681 Apnea, not elsewhere classified: Secondary | ICD-10-CM | POA: Diagnosis not present

## 2024-05-20 DIAGNOSIS — R0689 Other abnormalities of breathing: Secondary | ICD-10-CM

## 2024-05-20 NOTE — Patient Instructions (Signed)

## 2024-05-20 NOTE — Progress Notes (Signed)
 Subjective:    Patient ID: Amy Reed is a 38 y.o. female.  HPI    True Mar, MD, PhD [facility] [address], [address] [address] [place], [place] [address]  Dear Dr. Frann,   I saw your patient, Amy Reed with said, upon your kind request in my sleep clinic today for initial consultation of her sleep disorder, in particular, concern for underlying obstructive sleep apnea.  The patient is unaccompanied today.  As you know, Amy Reed is a 38 year old female with an underlying medical history of allergies, asthma, reflux disease, Lynch syndrome, and family history of colon cancer, who reports an intermittent sense of gasping when she is asleep, witnessed apneas per wife's report and daytime tiredness.  Her Epworth sleepiness score is 6 out of 24, fatigue severity score is 43 out of 63.  I reviewed your office note from 04/18/2024.  She lives with her family including wife and 4 children, ages 61-16.  They have 3 dogs in the household, the dogs do not sleep in the bedroom with them.  She has a TV in her bedroom but it is typically not on at night.  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  Bedtime is around 10 and rise time around 7.  She has no nightly nocturia but has occasional to frequent morning headaches which are described as dull and achy.  She is not aware of any family history of sleep apnea.  She does not drink caffeine daily.  She drinks alcohol nearly daily, a total of 2-3 drinks at a time, about 5 days a week.  She is a non-smoker of cigarettes and smokes hookah occasionally.    Her Past Medical History Is Significant For: Past Medical History:  Diagnosis Date   Allergy     Asthma    Family history of colon cancer    GERD (gastroesophageal reflux disease)    Lynch syndrome     Her Past Surgical History Is Significant For: Past Surgical History:  Procedure Laterality Date   76 HOUR PH STUDY N/A 10/10/2017   Procedure: 24 HOUR PH STUDY-OFF  of PPI;  Surgeon: Shila Gustav GAILS, MD;  Location: [facility];  Service: Endoscopy;  Laterality: N/A;   DILATION AND CURETTAGE OF UTERUS     ESOPHAGEAL MANOMETRY N/A 10/10/2017   Procedure: ESOPHAGEAL MANOMETRY (EM);  Surgeon: Shila Gustav GAILS, MD;  Location: [facility];  Service: Endoscopy;  Laterality: N/A;   PH IMPEDANCE STUDY N/A 10/10/2017   Procedure: PH IMPEDANCE STUDY-OFF of PPI;  Surgeon: Shila Gustav GAILS, MD;  Location: [facility];  Service: Endoscopy;  Laterality: N/A;    Her Family History Is Significant For: Family History  Problem Relation Age of Onset   Colon cancer Father 40   Colon polyps Sister    Cervical polyp Sister    Colon polyps Brother    Colon cancer Paternal Aunt    Breast cancer Paternal Aunt    Colon cancer Paternal Aunt    Diabetes Maternal Grandmother    Diabetes Maternal Grandfather    Breast cancer Paternal Grandmother    Diabetes Paternal Grandmother    Cervical cancer Paternal Grandmother    Diabetes Paternal Grandfather    Colon cancer Paternal Grandfather    Esophageal cancer Neg Hx    Stomach cancer Neg Hx    Rectal cancer Neg Hx    Sleep apnea Neg Hx     Her Social History Is Significant For: Social History   Socioeconomic  History   Marital status: Married    Spouse name: Not on file   Number of children: Not on file   Years of education: Not on file   Highest education level: Some college, no degree  Occupational History   Not on file  Tobacco Use   Smoking status: Some Days    Types: Cigarettes   Smokeless tobacco: Never  Vaping Use   Vaping status: Never Used  Substance and Sexual Activity   Alcohol use: Yes    Comment: 2-3 times a week.   Drug use: No   Sexual activity: Yes    Partners: Female    Birth control/protection: None  Other Topics Concern   Not on file  Social History Narrative   1 cup of caffeine daily    Social Drivers of Health   Financial Resource Strain: Low Risk  (04/18/2024)   Overall  Financial Resource Strain (CARDIA)    Difficulty of Paying Living Expenses: Not very hard  Food Insecurity: No Food Insecurity (04/18/2024)   Hunger Vital Sign    Worried About Running Out of Food in the Last Year: Never true    Ran Out of Food in the Last Year: Never true  Transportation Needs: No Transportation Needs (04/18/2024)   PRAPARE - Administrator, Civil Service (Medical): No    Lack of Transportation (Non-Medical): No  Physical Activity: Unknown (09/07/2023)   Exercise Vital Sign    Days of Exercise per Week: 0 days    Minutes of Exercise per Session: Not on file  Stress: No Stress Concern Present (09/07/2023)   [employer] of Occupational Health - Occupational Stress Questionnaire    Feeling of Stress : Only a little  Social Connections: Unknown (09/07/2023)   Social Connection and Isolation Panel    Frequency of Communication with Friends and Family: More than three times a week    Frequency of Social Gatherings with Friends and Family: Once a week    Attends Religious Services: Patient declined    Database administrator or Organizations: Yes    Attends Banker Meetings: Never    Marital Status: Married    Her Allergies Are:  Allergies  Allergen Reactions   Shellfish Allergy  Anaphylaxis   Latex Itching  :   Her Current Medications Are:  Outpatient Encounter Medications as of 05/20/2024  Medication Sig   albuterol  (PROVENTIL ) (2.5 MG/3ML) 0.083% nebulizer solution Take 3 mLs (2.5 mg total) by nebulization every 6 (six) hours as needed for wheezing or shortness of breath.   albuterol  (VENTOLIN  HFA) 108 (90 Base) MCG/ACT inhaler Inhale 2 puffs into the lungs every 6 (six) hours as needed for wheezing or shortness of breath.   EPINEPHrine  (EPIPEN  2-PAK) 0.3 mg/0.3 mL IJ SOAJ injection Inject 0.3 mg into the muscle as needed for anaphylaxis.   fluticasone  (FLOVENT  HFA) 110 MCG/ACT inhaler Rinse mouth out after use. Take 2 puffs twice daily when in  yellow zone. (Patient taking differently: Rinse mouth out after use. Take 2 puffs twice daily when in yellow zone. As needed)   levocetirizine (XYZAL ) 5 MG tablet Take 1 tablet (5 mg total) by mouth every evening. (Patient taking differently: Take 5 mg by mouth every evening. An needed)   metroNIDAZOLE  (FLAGYL ) 500 MG tablet Take 1 tablet (500 mg total) by mouth 2 (two) times daily.   Vitamin D , Ergocalciferol , (DRISDOL ) 1.25 MG (50000 UNIT) CAPS capsule Take 1 capsule (50,000 Units total) by mouth every 7 (seven)  days.   fluconazole  (DIFLUCAN ) 150 MG tablet Take 1 tab, repeat in 72 hours if no improvement. (Patient not taking: Reported on 05/20/2024)   fluticasone  (FLONASE ) 50 MCG/ACT nasal spray Place 2 sprays into both nostrils daily. (Patient not taking: Reported on 05/20/2024)   No facility-administered encounter medications on file as of 05/20/2024.  :   Review of Systems:  Out of a complete 14 point review of systems, all are reviewed and negative with the exception of these symptoms as listed below:  Review of Systems  Neurological:        Patient is here for sleep consult - never diagnosed with sleep apnea or had a machine in the past.  Wife states she stops breathing in her sleep no snoring    FSS - 43 / ESS- 6     Objective:  Neurological Exam  Physical Exam Physical Examination:   Vitals:   05/20/24 1123  BP: 118/80  Pulse: 81  SpO2: 98%    General Examination: The patient is a very pleasant 38 y.o. female in no acute distress. She appears well-developed and well-nourished and well groomed.   HEENT: Normocephalic, atraumatic, pupils are equal, round and reactive to light, extraocular tracking is good without limitation to gaze excursion or nystagmus noted. Hearing is grossly intact. Face is symmetric with normal facial animation. Speech is clear with no dysarthria noted. There is no hypophonia. There is no lip, neck/head, jaw or voice tremor. Neck is supple with full  range of passive and active motion. There are no carotid bruits on auscultation. Oropharynx exam reveals: No significant mouth dryness, good dental hygiene, veneers in place, mild airway crowding secondary to small airway entry, tonsillar size 1+, Mallampati class II, neck circumference 13-1/4 inches, minimal overbite noted.  Tongue protrudes centrally and palate elevates symmetrically.   Chest: Clear to auscultation without wheezing, rhonchi or crackles noted.  Heart: S1+S2+0, regular and normal without murmurs, rubs or gallops noted.   Abdomen: Soft, non-tender and non-distended.  Extremities: There is no pitting edema in the distal lower extremities bilaterally.   Skin: Warm and dry without trophic changes noted.   Musculoskeletal: exam reveals no obvious joint deformities.   Neurologically:  Mental status: The patient is awake, alert and oriented in all 4 spheres. Her immediate and remote memory, attention, language skills and fund of knowledge are appropriate. There is no evidence of aphasia, agnosia, apraxia or anomia. Speech is clear with normal prosody and enunciation. Thought process is linear. Mood is normal and affect is normal.  Cranial nerves II - XII are as described above under HEENT exam.  Motor exam: Normal bulk, strength and tone is noted. There is no obvious action or resting tremor.  Fine motor skills and coordination: grossly intact.  Cerebellar testing: No dysmetria or intention tremor. There is no truncal or gait ataxia.  Sensory exam: intact to light touch in the upper and lower extremities.  Gait, station and balance: She stands easily. No veering to one side is noted. No leaning to one side is noted. Posture is age-appropriate and stance is narrow based. Gait shows normal stride length and normal pace. No problems turning are noted.   Assessment and Plan:  In summary, Amy Reed is a very pleasant 38 y.o.-year old female with an underlying medical history of  allergies, asthma, reflux disease, Lynch syndrome, and family history of colon cancer, whose history and physical exam are concerning for sleep disordered breathing, particularly obstructive sleep apnea (OSA). A laboratory  attended sleep study is typically considered gold standard for evaluation of sleep disordered breathing.   I had a long chat with the patient about my findings and the diagnosis of sleep apnea, particularly OSA, its prognosis and treatment options. We talked about medical/conservative treatments, surgical interventions and non-pharmacological approaches for symptom control. I explained, in particular, the risks and ramifications of untreated moderate to severe OSA, especially with respect to developing cardiovascular disease down the road, including congestive heart failure (CHF), difficult to treat hypertension, cardiac arrhythmias (particularly A-fib), neurovascular complications including TIA, stroke and dementia. Even type 2 diabetes has, in part, been linked to untreated OSA. Symptoms of untreated OSA may include (but may not be limited to) daytime sleepiness, nocturia (i.e. frequent nighttime urination), memory problems, mood irritability and suboptimally controlled or worsening mood disorder such as depression and/or anxiety, lack of energy, lack of motivation, physical discomfort, as well as recurrent headaches, especially morning or nocturnal headaches. We talked about the importance of maintaining a healthy lifestyle and striving for healthy weight. In addition, we talked about the importance of striving for and maintaining good sleep hygiene. I recommended a sleep study at this time. I outlined the differences between a laboratory attended sleep study which is considered more comprehensive and accurate over the option of a home sleep test (HST); the latter may lead to underestimation of sleep disordered breathing in some instances and does not help with diagnosing upper airway  resistance syndrome and is not accurate enough to diagnose primary central sleep apnea typically. I outlined possible surgical and non-surgical treatment options of OSA, including the use of a positive airway pressure (PAP) device (i.e. CPAP, AutoPAP/APAP or BiPAP in certain circumstances), a custom-made dental device (aka oral appliance, which would require a referral to a specialist dentist or orthodontist typically, and is generally speaking not considered for patients with full dentures or edentulous state), upper airway surgical options, such as traditional UPPP (which is not considered a first-line treatment) or the Inspire device (hypoglossal nerve stimulator, which would involve a referral for consultation with an ENT surgeon, after careful selection, following inclusion criteria - also not first-line treatment). I explained the PAP treatment option to the patient in detail, as this is generally considered first-line treatment.  The patient indicated that she would be willing to try PAP therapy, if the need arises. I explained the importance of being compliant with PAP treatment, not only for insurance purposes but primarily to improve patient's symptoms symptoms, and for the patient's long term health benefit, including to reduce Her cardiovascular risks longer-term.    We will pick up our discussion about the next steps and treatment options after testing.  We will keep her posted as to the test results by phone call and/or MyChart messaging where possible.  We will plan to follow-up in sleep clinic accordingly as well.  I answered all her questions today and the patient was in agreement.   I encouraged her to call with any interim questions, concerns, problems or updates or email us  through MyChart.  Generally speaking, sleep test authorizations may take up to 2 weeks, sometimes less, sometimes longer, the patient is encouraged to get in touch with us  if they do not hear back from the sleep lab staff  directly within the next 2 weeks.  Thank you very much for allowing me to participate in the care of this nice patient. If I can be of any further assistance to you please do not hesitate to call me at  [phone].  Sincerely,   True Mar, MD, PhD

## 2024-05-22 ENCOUNTER — Telehealth: Payer: Self-pay | Admitting: Neurology

## 2024-05-22 NOTE — Telephone Encounter (Signed)
 [employer] no auth req via website & [facility] [employer] pending

## 2024-05-26 NOTE — Telephone Encounter (Signed)
 Checked status on the portal it is still pending

## 2024-05-29 NOTE — Telephone Encounter (Signed)
Checked status it is still pending.  

## 2024-05-29 NOTE — Telephone Encounter (Signed)
 [employer] no auth req bc [employer] is primary

## 2024-06-03 NOTE — Telephone Encounter (Signed)
 spoke w/pt she states she wcb to schedule

## 2024-06-17 ENCOUNTER — Encounter: Payer: Self-pay | Admitting: Dermatology

## 2024-06-17 ENCOUNTER — Ambulatory Visit: Admitting: Dermatology

## 2024-06-17 VITALS — BP 137/93 | HR 83

## 2024-06-17 DIAGNOSIS — L814 Other melanin hyperpigmentation: Secondary | ICD-10-CM

## 2024-06-17 DIAGNOSIS — Z1283 Encounter for screening for malignant neoplasm of skin: Secondary | ICD-10-CM | POA: Diagnosis not present

## 2024-06-17 DIAGNOSIS — D1801 Hemangioma of skin and subcutaneous tissue: Secondary | ICD-10-CM

## 2024-06-17 DIAGNOSIS — Z148 Genetic carrier of other disease: Secondary | ICD-10-CM

## 2024-06-17 DIAGNOSIS — L821 Other seborrheic keratosis: Secondary | ICD-10-CM

## 2024-06-17 DIAGNOSIS — L578 Other skin changes due to chronic exposure to nonionizing radiation: Secondary | ICD-10-CM | POA: Diagnosis not present

## 2024-06-17 DIAGNOSIS — W908XXA Exposure to other nonionizing radiation, initial encounter: Secondary | ICD-10-CM

## 2024-06-17 DIAGNOSIS — D229 Melanocytic nevi, unspecified: Secondary | ICD-10-CM

## 2024-06-17 NOTE — Progress Notes (Signed)
   New Patient Visit   Subjective  Amy Reed is a 38 y.o. female who presents for the following: Skin Cancer Screening and Full Body Skin Exam  The patient presents for Total-Body Skin Exam (TBSE) for skin cancer screening and mole check. The patient has spots, moles and lesions to be evaluated, some may be new or changing. Personal history of Lynch Syndrome. No previous history of skin cancer.  The following portions of the chart were reviewed this encounter and updated as appropriate: medications, allergies, medical history  Review of Systems:  No other skin or systemic complaints except as noted in HPI or Assessment and Plan.  Objective  Well appearing patient in no apparent distress; mood and affect are within normal limits.  A full examination was performed including scalp, head, eyes, ears, nose, lips, neck, chest, axillae, abdomen, back, buttocks, bilateral upper extremities, bilateral lower extremities, hands, feet, fingers, toes, fingernails, and toenails. All findings within normal limits unless otherwise noted below.   Relevant physical exam findings are noted in the Assessment and Plan.    Assessment & Plan   SKIN CANCER SCREENING PERFORMED TODAY.  ACTINIC DAMAGE - Chronic condition, secondary to cumulative UV/sun exposure - diffuse scaly erythematous macules with underlying dyspigmentation - Recommend daily broad spectrum sunscreen SPF 30+ to sun-exposed areas, reapply every 2 hours as needed.  - Staying in the shade or wearing long sleeves, sun glasses (UVA+UVB protection) and wide brim hats (4-inch brim around the entire circumference of the hat) are also recommended for sun protection.  - Call for new or changing lesions.  LENTIGINES, SEBORRHEIC KERATOSES, HEMANGIOMAS - Benign normal skin lesions - Benign-appearing - Call for any changes  MELANOCYTIC NEVI - Tan-brown and/or pink-flesh-colored symmetric macules and papules - Benign appearing on exam  today - Observation - Call clinic for new or changing moles - Recommend daily use of broad spectrum spf 30+ sunscreen to sun-exposed areas.   ACTINIC SKIN DAMAGE   LENTIGINES   SEBORRHEIC KERATOSIS   CHERRY ANGIOMA   MULTIPLE BENIGN NEVI    Return in about 1 year (around 06/17/2025) for TBSE.  Documentation: I have reviewed the above documentation for accuracy and completeness, and I agree with the above.  RUFUS CHRISTELLA HOLY, MD

## 2024-07-07 ENCOUNTER — Ambulatory Visit: Payer: Self-pay

## 2024-07-09 ENCOUNTER — Telehealth: Payer: Self-pay | Admitting: *Deleted

## 2024-07-09 NOTE — Telephone Encounter (Signed)
 Spoke with the patient regarding the referral to GYN oncology. Patient scheduled as new patient with Dr Viktoria at 10:30 am.  Patient given an arrival time of *10 am. Explained to the patient the the doctor will perform a pelvic exam at this visit. Patient given the policy that only one visitor allowed and that visitor must be over 16 yrs are allowed in the Cancer Center. Patient given the address/phone number for the clinic and that the center offers free valet service. Patient aware that masks optional.

## 2024-07-16 ENCOUNTER — Ambulatory Visit: Admitting: Obstetrics & Gynecology

## 2024-07-17 ENCOUNTER — Encounter: Payer: Self-pay | Admitting: Gynecologic Oncology

## 2024-07-17 NOTE — Progress Notes (Unsigned)
 GYNECOLOGIC ONCOLOGY NEW PATIENT CONSULTATION   Patient Name: Amy Reed  Patient Age: 38 y.o. Date of Service: 07/18/24 Referring Provider: Lynwood Solomons, MD  Primary Care Provider: Frann Mabel Mt, DO Consulting Provider: Comer Dollar, MD   Assessment/Plan:  Premenopausal patient with Lynch syndrome, abnormal uterine bleeding.  Due to her extensive family history as well as germline genetic testing that showed Lynch syndrome.  With an MLH1 mutation, we reviewed that the cumulative risk of diagnosis through age 35 is estimated to be between 35 and 55% and for ovarian cancer between 40 and 20%.  For endometrial cancer, hysterectomy has not been shown to reduce an atrial cancer related mortality but can reduce the incidence.  Risk-reducing surgery can be considered when childbearing is complete or based on other patient risk factors.  Given higher risk of early endometrial cancer as well as ovarian cancer with the mutation that the patient has, risk-reducing hysterectomy with BSO can be considered starting at age 93. Estrogen replacement should be considered.  Patient has completed childbearing.  We discussed possibilities in terms of risk reducing surgery now/in the near future versus waiting until in her 18s.  Given multiple family members with cancer diagnosed in their 41s and patient's age of 27 later this month (and completion of child bearing), she would like to move forward with definitive surgery after the holidays.  We discussed decision about proceeding with BSO at the time of total hysterectomy versus bilateral salpingectomy and waiting until closer to menopause to remove the ovaries.  Discussed recommendation for estrogen replacement therapy if we take her ovaries now.  Ultimately, she think she would like to move forward with BSO but will think about this over the next couple of months.  Endometrial biopsy can be considered every 1 to 2 years starting between age 56 and  53 in the setting of Lynch syndrome.  Given her abnormal uterine bleeding, recommended endometrial biopsy today which was performed.  We will plan on CA125 and pelvic ultrasound for preoperative planning.  Discussed that CA125 is not a diagnostic test and that there are no screening test for ovarian cancer.  Patient would like to plan for definitive surgery in January.  Tentative date for surgery selected today.  We will have phone preoperative visit closer to the date of surgery.  Discussed recommendation to decrease alcohol consumption between now and her surgery.  A copy of this note was sent to the patient's referring provider.   60 minutes of total time was spent for this patient encounter, including preparation, face-to-face counseling with the patient and coordination of care, and documentation of the encounter.  Comer Dollar, MD  Division of Gynecologic Oncology  Department of Obstetrics and Gynecology  [facility]  ___________________________________________  Chief Complaint: No chief complaint on file.   History of Present Illness:  Amy Reed is a 38 y.o. y.o. female who is seen in consultation at the request of Dr. Solomons for an evaluation of risk reduction in the setting of Lynch syndrome.  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  Reports multiple family members with cancer diagnoses in their 86s.  Patient reports overall doing well.  In the last year, she has had some change to her bleeding.  Describes 2 or 3 days at the start of her menses that are heavier and much more painful.  Uses over-the-counter medication for pain.  Has occasional  intermenstrual spotting.  Sometimes has pelvic pain outside of the first few days of her menstrual cycle.  She endorses a good appetite.  Has gained approximately 30 pounds in the last month.  Bowel function is stable, alternates  occasionally between constipation and diarrhea.  Has some urinary urgency and if is not able to get to the bathroom quickly enough has some urge urinary incontinence.  In terms of her asthma, she was taking daily medication but stopped this as she was not feeling good.  Continues to use her rescue albuterol  inhaler approximately once a week.  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  Drinks approximately 3 times a week and will have 5 or 6 drinks (either wine or shots).  PAST MEDICAL HISTORY:  Past Medical History:  Diagnosis Date   Allergy     Asthma    Family history of colon cancer    GERD (gastroesophageal reflux disease)    Lynch syndrome      PAST SURGICAL HISTORY:  Past Surgical History:  Procedure Laterality Date   52 HOUR PH STUDY N/A 10/10/2017   Procedure: 24 HOUR PH STUDY-OFF of PPI;  Surgeon: Shila Gustav GAILS, MD;  Location: [facility];  Service: Endoscopy;  Laterality: N/A;   DILATION AND CURETTAGE OF UTERUS     ESOPHAGEAL MANOMETRY N/A 10/10/2017   Procedure: ESOPHAGEAL MANOMETRY (EM);  Surgeon: Shila Gustav GAILS, MD;  Location: [facility];  Service: Endoscopy;  Laterality: N/A;   PH IMPEDANCE STUDY N/A 10/10/2017   Procedure: PH IMPEDANCE STUDY-OFF of PPI;  Surgeon: Shila Gustav GAILS, MD;  Location: [facility];  Service: Endoscopy;  Laterality: N/A;    OB/GYN HISTORY:  OB History  Gravida Para Term Preterm AB Living  3 1  1 2 1   SAB IAB Ectopic Multiple Live Births  2    1    # Outcome Date GA Lbr Len/2nd Weight Sex Type Anes PTL Lv  3 Preterm 12/10/07 [redacted]w[redacted]d   M Vag-Spont EPI  LIV  2 SAB 2006          1 SAB             No LMP recorded.  Age at menarche: 22  Hx of STDs: denies Last pap: 09/2022 - ASCUS, HR HPV negative History of abnormal pap smears: yes  SCREENING STUDIES:  Last mammogram: 2024  Last colonoscopy: 2025  MEDICATIONS: Outpatient Encounter Medications as of 07/18/2024  Medication Sig   albuterol  (PROVENTIL ) (2.5 MG/3ML)  0.083% nebulizer solution Take 3 mLs (2.5 mg total) by nebulization every 6 (six) hours as needed for wheezing or shortness of breath.   EPINEPHrine  (EPIPEN  2-PAK) 0.3 mg/0.3 mL IJ SOAJ injection Inject 0.3 mg into the muscle as needed for anaphylaxis.   levocetirizine (XYZAL ) 5 MG tablet Take 1 tablet (5 mg total) by mouth every evening.   metroNIDAZOLE  (METROGEL ) 0.75 % vaginal gel Place vaginally at bedtime.   [DISCONTINUED] Vitamin D , Ergocalciferol , (DRISDOL ) 1.25 MG (50000 UNIT) CAPS capsule Take 1 capsule (50,000 Units total) by mouth every 7 (seven) days.   [DISCONTINUED] albuterol  (VENTOLIN  HFA) 108 (90 Base) MCG/ACT inhaler Inhale 2 puffs into the lungs every 6 (six) hours as needed for wheezing or shortness of breath.   [DISCONTINUED] fluticasone  (FLOVENT  HFA) 110 MCG/ACT inhaler Rinse mouth out after use. Take 2 puffs twice daily when in yellow zone.   No facility-administered encounter medications on file as of 07/18/2024.    ALLERGIES:  Allergies  Allergen Reactions  Shellfish Allergy  Anaphylaxis   Latex Itching     FAMILY HISTORY:  Family History  Problem Relation Age of Onset   Cervical cancer Mother    Colon cancer Father 47   Prostate cancer Father    Colon polyps Sister    Cervical polyp Sister    Skin cancer Sister    Colon polyps Brother    Colon cancer Paternal Aunt    Breast cancer Paternal Aunt    Colon cancer Paternal Aunt    Diabetes Maternal Grandmother    Diabetes Maternal Grandfather    Breast cancer Paternal Grandmother    Diabetes Paternal Grandmother    Cervical cancer Paternal Grandmother    Diabetes Paternal Grandfather    Colon cancer Paternal Grandfather    Esophageal cancer Neg Hx    Stomach cancer Neg Hx    Rectal cancer Neg Hx    Sleep apnea Neg Hx      SOCIAL HISTORY:  Social Connections: Unknown (09/07/2023)   Social Connection and Isolation Panel    Frequency of Communication with Friends and Family: More than three times a week     Frequency of Social Gatherings with Friends and Family: Once a week    Attends Religious Services: Patient declined    Database Administrator or Organizations: Yes    Attends Banker Meetings: Never    Marital Status: Married    REVIEW OF SYSTEMS:  + fatigue, shortness of breath, vaginal discharge, menstrual problems, headache Denies appetite changes, fevers, chills, fatigue, unexplained weight changes. Denies hearing loss, neck lumps or masses, mouth sores, ringing in ears or voice changes. Denies cough or wheezing.  Denies shortness of breath. Denies chest pain or palpitations. Denies leg swelling. Denies abdominal distention, pain, blood in stools, constipation, diarrhea, nausea, vomiting, or early satiety. Denies pain with intercourse, dysuria, frequency, hematuria or incontinence. Denies hot flashes, pelvic pain, vaginal bleeding or vaginal discharge.   Denies joint pain, back pain or muscle pain/cramps. Denies itching, rash, or wounds. Denies dizziness, headaches, numbness or seizures. Denies swollen lymph nodes or glands, denies easy bruising or bleeding. Denies anxiety, depression, confusion, or decreased concentration.  Physical Exam:  Vital Signs for this encounter:  Blood pressure 136/78, pulse 80, temperature 98.3 F (36.8 C), temperature source Oral, resp. rate 19, height 5' 8 (1.727 m), weight 169 lb 3.2 oz (76.7 kg), SpO2 100%. Body mass index is 25.73 kg/m. General: Alert, oriented, no acute distress.  HEENT: Normocephalic, atraumatic. Sclera anicteric.  Chest: Unlabored breathing on room air. Abdomen: Soft, nondistended, nontender to palpation. No masses or hepatosplenomegaly appreciated. No palpable fluid wave.  Extremities: Grossly normal range of motion. Warm, well perfused. No edema bilaterally.  Skin: No rashes or lesions.  Lymphatics: No inguinal adenopathy.  GU:  Normal external female genitalia. No lesions. No discharge or bleeding.              Bladder/urethra:  No lesions or masses, well supported bladder             Vagina: Well rugated.             Cervix: Normal appearing, no lesions.             Uterus: 8 cm, mobile, no parametrial involvement or nodularity.             Adnexa: No masses appreciated.  Rectal: Deferred.  UPT negative  Endometrial biopsy procedure Preoperative diagnosis: AUB Postoperative diagnosis: Same as above Physician: Viktoria MD Estimated  blood loss: Minimal Specimens: Endometrial biopsy Procedure: After the procedure was discussed with the patient including risks and benefits, she gave verbal consent.  She was then placed in dorsolithotomy position and a speculum was placed in the vagina.  Once the cervix was well visualized it was cleansed with Betadine x3.  A tenaculum was placed on the anterior lip of the cervix.  An endometrial Pipelle was then passed to a depth of just under 7cm.  1 pass was performed with scant tissue obtained.  This was placed in formalin.  Overall the patient tolerated the procedure well.  All instruments were removed from the vagina.  LABORATORY AND RADIOLOGIC DATA:  Outside medical records were reviewed to synthesize the above history, along with the history and physical obtained during the visit.   Lab Results  Component Value Date   WBC 4.0 04/18/2024   HGB 13.6 04/18/2024   HCT 41.3 04/18/2024   PLT 204.0 04/18/2024   GLUCOSE 79 04/18/2024   CHOL 145 04/18/2024   TRIG 80.0 04/18/2024   HDL 55.00 04/18/2024   LDLCALC 74 04/18/2024   ALT 10 04/18/2024   AST 13 04/18/2024   NA 138 04/18/2024   K 4.2 04/18/2024   CL 103 04/18/2024   CREATININE 0.87 04/18/2024   BUN 13 04/18/2024   CO2 27 04/18/2024   TSH 1.45 05/15/2023   HGBA1C 4.7 10/11/2014

## 2024-07-18 ENCOUNTER — Inpatient Hospital Stay

## 2024-07-18 ENCOUNTER — Encounter: Payer: Self-pay | Admitting: Gynecologic Oncology

## 2024-07-18 ENCOUNTER — Inpatient Hospital Stay: Attending: Gynecologic Oncology | Admitting: Gynecologic Oncology

## 2024-07-18 VITALS — BP 136/78 | HR 80 | Temp 98.3°F | Resp 19 | Ht 68.0 in | Wt 169.2 lb

## 2024-07-18 DIAGNOSIS — Z1507 Genetic susceptibility to malignant neoplasm of urinary tract: Secondary | ICD-10-CM

## 2024-07-18 DIAGNOSIS — Z800 Family history of malignant neoplasm of digestive organs: Secondary | ICD-10-CM | POA: Insufficient documentation

## 2024-07-18 DIAGNOSIS — K219 Gastro-esophageal reflux disease without esophagitis: Secondary | ICD-10-CM | POA: Insufficient documentation

## 2024-07-18 DIAGNOSIS — Z15068 Genetic susceptibility to other malignant neoplasm of digestive system: Secondary | ICD-10-CM | POA: Insufficient documentation

## 2024-07-18 DIAGNOSIS — Z803 Family history of malignant neoplasm of breast: Secondary | ICD-10-CM | POA: Insufficient documentation

## 2024-07-18 DIAGNOSIS — Z1504 Genetic susceptibility to malignant neoplasm of endometrium: Secondary | ICD-10-CM | POA: Insufficient documentation

## 2024-07-18 DIAGNOSIS — Z1502 Genetic susceptibility to malignant neoplasm of ovary: Secondary | ICD-10-CM | POA: Insufficient documentation

## 2024-07-18 DIAGNOSIS — Z15060 Genetic susceptibility to colorectal cancer: Secondary | ICD-10-CM | POA: Insufficient documentation

## 2024-07-18 DIAGNOSIS — Z1501 Genetic susceptibility to malignant neoplasm of breast: Secondary | ICD-10-CM | POA: Insufficient documentation

## 2024-07-18 DIAGNOSIS — Z1509 Genetic susceptibility to other malignant neoplasm: Secondary | ICD-10-CM

## 2024-07-18 DIAGNOSIS — N939 Abnormal uterine and vaginal bleeding, unspecified: Secondary | ICD-10-CM | POA: Diagnosis present

## 2024-07-18 DIAGNOSIS — Z8049 Family history of malignant neoplasm of other genital organs: Secondary | ICD-10-CM | POA: Diagnosis not present

## 2024-07-18 DIAGNOSIS — N3941 Urge incontinence: Secondary | ICD-10-CM | POA: Diagnosis not present

## 2024-07-18 DIAGNOSIS — J45909 Unspecified asthma, uncomplicated: Secondary | ICD-10-CM | POA: Insufficient documentation

## 2024-07-18 DIAGNOSIS — Z808 Family history of malignant neoplasm of other organs or systems: Secondary | ICD-10-CM | POA: Diagnosis not present

## 2024-07-18 DIAGNOSIS — Z1505 Genetic susceptibility to malignant neoplasm of fallopian tube(s): Secondary | ICD-10-CM

## 2024-07-18 DIAGNOSIS — Z8042 Family history of malignant neoplasm of prostate: Secondary | ICD-10-CM | POA: Insufficient documentation

## 2024-07-18 LAB — PREGNANCY, URINE: Preg Test, Ur: NEGATIVE

## 2024-07-18 NOTE — Patient Instructions (Addendum)
 Today you have an endometrial biopsy (sample taken from the lining of the uterus). You may have some spotting/cramping after this. Call the office for heavy bleeding.  Plan on having a CA 125 blood lab test drawn today and an ultrasound in the near future. You will be contacted with the results.  Preparing for your Surgery  Plan for surgery on September 24, 2024 with Dr. Comer Dollar at [facility]. You will be scheduled for robotic assisted total laparoscopic hysterectomy (removal of the uterus and cervix), bilateral salpingectomy (removal of both fallopian tubes), possible bilateral oophorectomy (removal of both ovaries), possible laparotomy (larger incision on your abdomen if needed).   You will have a preoperative phone call with Dr. Dollar and Eleanor Epps NP or Darice Sanes RN navigator prior to surgery.  Pre-operative Testing -You will receive a phone call from presurgical testing at [facility] to arrange for a pre-operative appointment and lab work.  -Bring your insurance card, copy of an advanced directive if applicable, medication list  -At that visit, you will be asked to sign a consent for a possible blood transfusion in case a transfusion becomes necessary during surgery.  The need for a blood transfusion is rare but having consent is a necessary part of your care.     -You should not be taking blood thinners or aspirin at least ten days prior to surgery unless instructed by your surgeon.  -Do not take supplements such as fish oil (omega 3), red yeast rice, turmeric before your surgery. STOP TAKING AT LEAST 10 DAYS BEFORE SURGERY. You want to avoid medications with aspirin in them including headache powders such as BC or Goody's), Excedrin migraine.  -If you are taking a GLP-1 medication/injection such as Ozempic, Mounjaro, [id number], this needs to be held before surgery for at least 7 days before.  Day Before Surgery at Home -You will be asked to take in a light  diet the day before surgery. You will be advised you can have clear liquids up until 3 hours before your surgery.    Eat a light diet the day before surgery.  Examples including soups, broths, toast, yogurt, mashed potatoes.  AVOID GAS PRODUCING FOODS AND BEVERAGES. Things to avoid include carbonated beverages (fizzy beverages, sodas), raw fruits and raw vegetables (uncooked), or beans.   If your bowels are filled with gas, your surgeon will have difficulty visualizing your pelvic organs which increases your surgical risks.  Your role in recovery Your role is to become active as soon as directed by your doctor, while still giving yourself time to heal.  Rest when you feel tired. You will be asked to do the following in order to speed your recovery:  - Cough and breathe deeply. This helps to clear and expand your lungs and can prevent pneumonia after surgery.  - STAY ACTIVE WHEN YOU GET HOME. Do mild physical activity. Walking or moving your legs help your circulation and body functions return to normal. Do not try to get up or walk alone the first time after surgery.   -If you develop swelling on one leg or the other, pain in the back of your leg, redness/warmth in one of your legs, please call the office or go to the Emergency Room to have a doppler to rule out a blood clot. For shortness of breath, chest pain-seek care in the Emergency Room as soon as possible. - Actively manage your pain. Managing your pain lets you move in comfort. We will  ask you to rate your pain on a scale of zero to 10. It is your responsibility to tell your doctor or nurse where and how much you hurt so your pain can be treated.  Special Considerations -If you are diabetic, you may be placed on insulin after surgery to have closer control over your blood sugars to promote healing and recovery.  This does not mean that you will be discharged on insulin.  If applicable, your oral antidiabetics will be resumed when you are  tolerating a solid diet.  -Your final pathology results from surgery should be available around one week after surgery and the results will be relayed to you when available.  -FMLA forms can be faxed to [phone] and please allow 5-7 business days for completion.  Pain Management After Surgery -You will be prescribed your pain medication and bowel regimen medications before surgery closer to the date so that you can have these available when you are discharged from the hospital. The pain medication is for use ONLY AFTER surgery and a new prescription will not be given.   -Make sure that you have Tylenol  and Ibuprofen  IF YOU ARE ABLE TO TAKE THESE MEDICATIONS at home to use on a regular basis after surgery for pain control. We recommend alternating the medications every hour to six hours since they work differently and are processed in the body differently for pain relief.  -Review the attached handout on narcotic use and their risks and side effects.   Bowel Regimen -You will be prescribed Sennakot-S to take nightly to prevent constipation especially if you are taking the narcotic pain medication intermittently.  It is important to prevent constipation and drink adequate amounts of liquids. You can stop taking this medication when you are not taking pain medication and you are back on your normal bowel routine.  Risks of Surgery Risks of surgery are low but include bleeding, infection, damage to surrounding structures, re-operation, blood clots, and very rarely death.   Blood Transfusion Information (For the consent to be signed before surgery)  We will be checking your blood type before surgery so in case of emergencies, we will know what type of blood you would need.                                            WHAT IS A BLOOD TRANSFUSION?  A transfusion is the replacement of blood or some of its parts. Blood is made up of multiple cells which provide different functions. Red blood  cells carry oxygen and are used for blood loss replacement. White blood cells fight against infection. Platelets control bleeding. Plasma helps clot blood. Other blood products are available for specialized needs, such as hemophilia or other clotting disorders. BEFORE THE TRANSFUSION  Who gives blood for transfusions?  You may be able to donate blood to be used at a later date on yourself (autologous donation). Relatives can be asked to donate blood. This is generally not any safer than if you have received blood from a stranger. The same precautions are taken to ensure safety when a relative's blood is donated. Healthy volunteers who are fully evaluated to make sure their blood is safe. This is blood bank blood. Transfusion therapy is the safest it has ever been in the practice of medicine. Before blood is taken from a donor, a complete history is taken to make  sure that person has no history of diseases nor engages in risky social behavior (examples are intravenous drug use or sexual activity with multiple partners). The donor's travel history is screened to minimize risk of transmitting infections, such as malaria. The donated blood is tested for signs of infectious diseases, such as HIV and hepatitis. The blood is then tested to be sure it is compatible with you in order to minimize the chance of a transfusion reaction. If you or a relative donates blood, this is often done in anticipation of surgery and is not appropriate for emergency situations. It takes many days to process the donated blood. RISKS AND COMPLICATIONS Although transfusion therapy is very safe and saves many lives, the main dangers of transfusion include:  Getting an infectious disease. Developing a transfusion reaction. This is an allergic reaction to something in the blood you were given. Every precaution is taken to prevent this. The decision to have a blood transfusion has been considered carefully by your caregiver before  blood is given. Blood is not given unless the benefits outweigh the risks.  AFTER SURGERY INSTRUCTIONS  Return to work: 4-6 weeks if applicable  Activity: 1. Be up and out of the bed during the day.  Take a nap if needed.  You may walk up steps but be careful and use the hand rail.  Stair climbing will tire you more than you think, you may need to stop part way and rest.   2. No lifting or straining for 6 weeks over 10 pounds. No pushing, pulling, straining for 6 weeks.  3. No driving for 4-89 days when the following criteria have been met: Do not drive if you are taking narcotic pain medicine and make sure that your reaction time has returned.   4. You can shower as soon as the next day after surgery. Shower daily.  Use your regular soap and water (not directly on the incision) and pat your incision(s) dry afterwards; don't rub.  No tub baths or submerging your body in water until cleared by your surgeon. If you have the soap that was given to you by pre-surgical testing that was used before surgery, you do not need to use it afterwards because this can irritate your incisions.   5. No sexual activity and nothing in the vagina for 12 weeks.  6. You may experience a small amount of clear drainage from your incisions, which is normal.  If the drainage persists, increases, or changes color please call the office.  7. Do not use creams, lotions, or ointments such as neosporin on your incisions after surgery until advised by your surgeon because they can cause removal of the dermabond glue on your incisions.    8. You may experience vaginal spotting after surgery or when the stitches at the top of the vagina begin to dissolve.  The spotting is normal but if you experience heavy bleeding, call our office.  9. Take Tylenol  or ibuprofen  first for pain if you are able to take these medications and only use narcotic pain medication for severe pain not relieved by the Tylenol  or Ibuprofen .  Monitor your  Tylenol  intake to a max of 4,000 mg in a 24 hour period. You can alternate these medications after surgery.  Diet: 1. Low sodium Heart Healthy Diet is recommended but you are cleared to resume your normal (before surgery) diet after your procedure.  2. It is safe to use a laxative, such as Miralax or Colace, if you have  difficulty moving your bowels before surgery. You have been prescribed Sennakot-S to take at bedtime every evening after surgery to keep bowel movements regular and to prevent constipation.    Wound Care: 1. Keep clean and dry.  Shower daily.  Reasons to call the Doctor: Fever - Oral temperature greater than 100.4 degrees Fahrenheit Foul-smelling vaginal discharge Difficulty urinating Nausea and vomiting Increased pain at the site of the incision that is unrelieved with pain medicine. Difficulty breathing with or without chest pain New calf pain especially if only on one side Sudden, continuing increased vaginal bleeding with or without clots.   Contacts: For questions or concerns you should contact:  Dr. Comer Dollar at [phone]  Eleanor Epps, NP at [phone]  After Hours: call [phone] and have the GYN Oncologist paged/contacted (after 5 pm or on the weekends). You will speak with an after hours RN and let he or she know you have had surgery.  Messages sent via mychart are for non-urgent matters and are not responded to after hours so for urgent needs, please call the after hours number.

## 2024-07-19 LAB — CA 125: Cancer Antigen (CA) 125: 12 U/mL (ref 0.0–38.1)

## 2024-07-21 ENCOUNTER — Ambulatory Visit: Payer: Self-pay | Admitting: Gynecologic Oncology

## 2024-07-21 ENCOUNTER — Encounter: Payer: Self-pay | Admitting: Gynecologic Oncology

## 2024-07-21 ENCOUNTER — Other Ambulatory Visit: Payer: Self-pay | Admitting: Family Medicine

## 2024-07-21 DIAGNOSIS — Z1231 Encounter for screening mammogram for malignant neoplasm of breast: Secondary | ICD-10-CM

## 2024-07-22 ENCOUNTER — Ambulatory Visit (HOSPITAL_COMMUNITY)
Admission: RE | Admit: 2024-07-22 | Discharge: 2024-07-22 | Disposition: A | Discharge status: Home / Self Care | Source: Ambulatory Visit | Attending: Gynecologic Oncology | Admitting: Gynecologic Oncology

## 2024-07-22 ENCOUNTER — Other Ambulatory Visit: Payer: Self-pay | Admitting: Gynecologic Oncology

## 2024-07-22 DIAGNOSIS — Z1509 Genetic susceptibility to other malignant neoplasm: Secondary | ICD-10-CM | POA: Insufficient documentation

## 2024-07-22 DIAGNOSIS — Z1507 Genetic susceptibility to malignant neoplasm of urinary tract: Secondary | ICD-10-CM | POA: Diagnosis present

## 2024-07-22 DIAGNOSIS — N939 Abnormal uterine and vaginal bleeding, unspecified: Secondary | ICD-10-CM

## 2024-07-22 DIAGNOSIS — Z15068 Genetic susceptibility to other malignant neoplasm of digestive system: Secondary | ICD-10-CM

## 2024-07-22 DIAGNOSIS — Z15060 Genetic susceptibility to colorectal cancer: Secondary | ICD-10-CM | POA: Insufficient documentation

## 2024-07-22 LAB — SURGICAL PATHOLOGY

## 2024-07-25 ENCOUNTER — Inpatient Hospital Stay: Admitting: Gynecologic Oncology

## 2024-07-28 ENCOUNTER — Telehealth: Payer: Self-pay | Admitting: *Deleted

## 2024-07-28 NOTE — Telephone Encounter (Signed)
 Spoke with Amy Reed and she agreed to the possibility of December 18 th surgery and she knows we will call closer to the date if that becomes available. Pt also agreed to moving her surgery to January 7th. From January 21 st. Pt thanked the office for calling.

## 2024-07-28 NOTE — Telephone Encounter (Signed)
 Spoke with patient about dates to move up her surgery from 1/21. Pt states 12/4 doesn't work and anything after 12/14 (if there are any cancellations?) but not Christmas Eve.   Pt also states anything after January 1st.? Pt is also trying to avoid two procedures and would rather just have the surgery as opposed to a D&C and then a second surgery under anesthesia.   Advised patient her message will be relayed to providers. Pt thanked the office for calling.

## 2024-08-06 ENCOUNTER — Ambulatory Visit: Admitting: Family Medicine

## 2024-08-06 ENCOUNTER — Telehealth: Payer: Self-pay | Admitting: *Deleted

## 2024-08-06 ENCOUNTER — Encounter: Payer: Self-pay | Admitting: Family Medicine

## 2024-08-06 VITALS — BP 128/74 | HR 98 | Temp 98.0°F | Resp 16 | Ht 68.0 in | Wt 168.8 lb

## 2024-08-06 DIAGNOSIS — R002 Palpitations: Secondary | ICD-10-CM

## 2024-08-06 DIAGNOSIS — Z13220 Encounter for screening for lipoid disorders: Secondary | ICD-10-CM | POA: Diagnosis not present

## 2024-08-06 DIAGNOSIS — E559 Vitamin D deficiency, unspecified: Secondary | ICD-10-CM | POA: Diagnosis not present

## 2024-08-06 DIAGNOSIS — F418 Other specified anxiety disorders: Secondary | ICD-10-CM | POA: Diagnosis not present

## 2024-08-06 DIAGNOSIS — R635 Abnormal weight gain: Secondary | ICD-10-CM

## 2024-08-06 MED ORDER — FLUCONAZOLE 150 MG PO TABS: ORAL_TABLET | ORAL | 0 refills | Status: DC

## 2024-08-06 MED ORDER — METRONIDAZOLE 0.75 % VA GEL: Freq: Every day | VAGINAL | 1 refills | Status: DC

## 2024-08-06 NOTE — Patient Instructions (Addendum)
Give us 2-3 business days to get the results of your labs back.   Keep the diet clean and stay active.  Stay hydrated.   Let us know if you need anything.  

## 2024-08-06 NOTE — Progress Notes (Signed)
 Chief Complaint  Patient presents with   Follow-up    Follow Up    Subjective: Patient is a 38 y.o. female here for follow-up.  She is here with her wife.  Patient has a history of Lynch syndrome.  She is seeing many specialist for cancer prevention.  She has hysterectomy coming up with her gynecology team currently scheduled for next month but they are trying to move up in the next couple weeks.  She had an endometrial biopsy a couple weeks ago and is having some pressure and pain.  She denies any bleeding or trauma otherwise.  The gynecology team told her this should last for around a day.  She has reached out to them again and is waiting on a return call.  She does have a history of recurrent yeast infections and BV.  She is requesting a refill of MetroGel  and Diflucan .  She has been dealing with some anxiety symptoms.  It seems to stem from the diagnosis of an endometrial mass.  She is not seeing a therapist or counselor.  No homicidal or suicidal ideation.  No self-medication.  She is not currently on any medication.  She has had palpitations for the last several months.  It is not associated with a new diagnosis or anxiety.  Seems to come and go.  Denies any loss of consciousness.  It is not happening currently.  No illicit drug use.  Past Medical History:  Diagnosis Date   Allergy     Asthma    Family history of colon cancer    GERD (gastroesophageal reflux disease)    Lynch syndrome     Objective: BP 128/74 (BP Location: Left Arm, Patient Position: Sitting)   Pulse 98   Temp 98 F (36.7 C) (Oral)   Resp 16   Ht 5' 8 (1.727 m)   Wt 168 lb 12.8 oz (76.6 kg)   SpO2 98%   BMI 25.67 kg/m  General: Awake, appears stated age Heart: RRR, no LE edema Lungs: CTAB, no rales, wheezes or rhonchi. No accessory muscle use Neuro: DTRs equal and symmetric in the lower extremities, no clonus, no cerebellar signs, gait is normal Abdomen: Bowel sounds present, soft, nontender,  nondistended Psych: Age appropriate judgment and insight, normal affect and mood  Assessment and Plan: Palpitations - Plan: TSH, T4, free, CBC, Comprehensive metabolic panel with GFR, Hemoglobin A1c, Magnesium  Vitamin D  insufficiency - Plan: VITAMIN D  25 Hydroxy (Vit-D Deficiency, Fractures)  Situational anxiety  Screening cholesterol level - Plan: Lipid panel  Weight gain  Check above labs.  We had a lengthy discussion about other lab work that we could draw.  Her sister was diagnosed with the syndrome associated with increased likelihood of cancers.  She does not remember the name.  She will find this information out and let me know and we will discuss screening here.  If labs are normal, we will proceed with a Zio patch for 14 days. Follow-up on vitamin D  deficiency history. Discussed an as needed medication versus chronic medication.  Decided to hold off against the latter due to more definitive treatment coming up.  Stay physically active. Screen. Usually associated with etiologies other than cancer.  She requested her hormones to be screened which I recommended she follow-up with her gynecology team for. I will see her as originally scheduled. The patient voiced understanding and agreement to the plan.  I spent 45 minutes with the patient discussing the above plans in addition to reviewing her chart  on the same day of the visit.  Mabel Mt [place], DO 08/06/24  4:47 PM

## 2024-08-06 NOTE — Telephone Encounter (Signed)
 Spoke with Amy Reed after she sent MyChart message. Pt states she is experiencing a dull ache with intermittent cramping to her lower abdomen that tends to be more to the left with a feeling of pressure to her vagina and lower back discomfort. Pt describes the discomfort as the feeling you get just before your period starts.  Pt states this started after the biopsy she had done in the office with Dr. Viktoria on 07/1424.  Pt denies fever, chills, nausea and no vomiting. She is having regular bowel movements and denies constipation and all urinary symptoms.   Pt is scheduled for surgery with Dr. Viktoria on January 7th. With the possibility of surgery being moved up to December 18 th.   Amy Reed states she is not taking anything for the discomfort and advised patient she can try alternating ice pack and heating pad to areas of discomfort. Also advised her message will be relayed to providers and once we know more about the December 18 th surgery date, the office would reach back out. Pt verbalized understanding and thanked the office for calling.

## 2024-08-07 ENCOUNTER — Ambulatory Visit: Payer: Self-pay | Admitting: Family Medicine

## 2024-08-07 ENCOUNTER — Ambulatory Visit: Attending: Family Medicine

## 2024-08-07 DIAGNOSIS — R002 Palpitations: Secondary | ICD-10-CM

## 2024-08-07 LAB — COMPREHENSIVE METABOLIC PANEL WITH GFR
ALT: 10 U/L (ref 0–35)
AST: 13 U/L (ref 0–37)
Albumin: 4.5 g/dL (ref 3.5–5.2)
Alkaline Phosphatase: 51 U/L (ref 39–117)
BUN: 11 mg/dL (ref 6–23)
CO2: 23 meq/L (ref 19–32)
Calcium: 9.5 mg/dL (ref 8.4–10.5)
Chloride: 105 meq/L (ref 96–112)
Creatinine, Ser: 0.91 mg/dL (ref 0.40–1.20)
GFR: 79.75 mL/min (ref >60.00)
Glucose, Bld: 88 mg/dL (ref 70–99)
Potassium: 3.8 meq/L (ref 3.5–5.1)
Sodium: 139 meq/L (ref 135–145)
Total Bilirubin: 0.4 mg/dL (ref 0.2–1.2)
Total Protein: 7.1 g/dL (ref 6.0–8.3)

## 2024-08-07 LAB — CBC
HCT: 40.1 % (ref 36.0–46.0)
Hemoglobin: 13.4 g/dL (ref 12.0–15.0)
MCHC: 33.4 g/dL (ref 30.0–36.0)
MCV: 99 fl (ref 78.0–100.0)
Platelets: 217 K/uL (ref 150.0–400.0)
RBC: 4.05 Mil/uL (ref 3.87–5.11)
RDW: 12.8 % (ref 11.5–15.5)
WBC: 5.5 K/uL (ref 4.0–10.5)

## 2024-08-07 LAB — HEMOGLOBIN A1C: Hgb A1c MFr Bld: 4.7 % (ref 4.6–6.5)

## 2024-08-07 LAB — TSH: TSH: 1.06 u[IU]/mL (ref 0.35–5.50)

## 2024-08-07 LAB — LIPID PANEL
Cholesterol: 142 mg/dL (ref 0–200)
HDL: 48.3 mg/dL (ref >39.00)
LDL Cholesterol: 65 mg/dL (ref 0–99)
NonHDL: 93.91
Total CHOL/HDL Ratio: 3
Triglycerides: 144 mg/dL (ref 0.0–149.0)
VLDL: 28.8 mg/dL (ref 0.0–40.0)

## 2024-08-07 LAB — MAGNESIUM: Magnesium: 1.8 mg/dL (ref 1.5–2.5)

## 2024-08-07 LAB — VITAMIN D 25 HYDROXY (VIT D DEFICIENCY, FRACTURES): VITD: 19.72 ng/mL — ABNORMAL LOW (ref 30.00–100.00)

## 2024-08-07 LAB — T4, FREE: Free T4: 0.78 ng/dL (ref 0.60–1.60)

## 2024-08-07 MED ORDER — VITAMIN D (ERGOCALCIFEROL) 1.25 MG (50000 UNIT) PO CAPS: 50000.0000 [IU] | ORAL_CAPSULE | ORAL | 0 refills | Status: DC

## 2024-08-07 NOTE — Progress Notes (Unsigned)
 EP to read.

## 2024-08-07 NOTE — Addendum Note (Signed)
 Addended by: Tom Macpherson D on: 08/07/2024 02:13 PM   Modules accepted: Orders

## 2024-08-07 NOTE — Telephone Encounter (Signed)
 14 plz. Thx.

## 2024-08-07 NOTE — Telephone Encounter (Signed)
 Spoke with patient and relayed message from Eleanor Epps, NP -Advised patient to continue to monitor and if the pain worsens with other symptoms such as nausea please call the office also patient can try tylenol  and ibuprofen . Pt verbalized understanding and thanked the office for calling.

## 2024-08-08 ENCOUNTER — Telehealth: Payer: Self-pay | Admitting: *Deleted

## 2024-08-08 NOTE — Telephone Encounter (Signed)
 Patient is aware that her surgery has been moved up to December 18 th. Pt reminded that she will receive a call from [facility] pre-admit to set up an appointment. Pt thanked the office for calling.

## 2024-08-13 ENCOUNTER — Encounter (HOSPITAL_COMMUNITY): Admission: RE | Admit: 2024-08-13 | Discharge: 2024-08-13 | Attending: Gynecologic Oncology

## 2024-08-13 ENCOUNTER — Other Ambulatory Visit: Payer: Self-pay

## 2024-08-13 ENCOUNTER — Ambulatory Visit
Admission: RE | Admit: 2024-08-13 | Discharge: 2024-08-13 | Disposition: A | Discharge status: Home / Self Care | Source: Ambulatory Visit

## 2024-08-13 ENCOUNTER — Encounter (HOSPITAL_COMMUNITY): Payer: Self-pay

## 2024-08-13 DIAGNOSIS — Z1231 Encounter for screening mammogram for malignant neoplasm of breast: Secondary | ICD-10-CM

## 2024-08-13 DIAGNOSIS — Z148 Genetic carrier of other disease: Secondary | ICD-10-CM | POA: Insufficient documentation

## 2024-08-13 DIAGNOSIS — Z01812 Encounter for preprocedural laboratory examination: Secondary | ICD-10-CM | POA: Insufficient documentation

## 2024-08-13 DIAGNOSIS — N939 Abnormal uterine and vaginal bleeding, unspecified: Secondary | ICD-10-CM | POA: Insufficient documentation

## 2024-08-13 HISTORY — DX: Headache, unspecified: R51.9

## 2024-08-13 LAB — CBC
HCT: 43 % (ref 36.0–46.0)
Hemoglobin: 14 g/dL (ref 12.0–15.0)
MCH: 32.6 pg (ref 26.0–34.0)
MCHC: 32.6 g/dL (ref 30.0–36.0)
MCV: 100 fL (ref 80.0–100.0)
Platelets: 226 K/uL (ref 150–400)
RBC: 4.3 MIL/uL (ref 3.87–5.11)
RDW: 12.2 % (ref 11.5–15.5)
WBC: 5.1 K/uL (ref 4.0–10.5)
nRBC: 0 % (ref 0.0–0.2)

## 2024-08-13 LAB — COMPREHENSIVE METABOLIC PANEL WITH GFR
ALT: 12 U/L (ref 0–44)
AST: 19 U/L (ref 15–41)
Albumin: 4.4 g/dL (ref 3.5–5.0)
Alkaline Phosphatase: 63 U/L (ref 38–126)
Anion gap: 11 (ref 5–15)
BUN: 12 mg/dL (ref 6–20)
CO2: 26 mmol/L (ref 22–32)
Calcium: 9.7 mg/dL (ref 8.9–10.3)
Chloride: 104 mmol/L (ref 98–111)
Creatinine, Ser: 0.9 mg/dL (ref 0.44–1.00)
GFR, Estimated: >60 mL/min (ref >60)
Glucose, Bld: 82 mg/dL (ref 70–99)
Potassium: 4.3 mmol/L (ref 3.5–5.1)
Sodium: 140 mmol/L (ref 135–145)
Total Bilirubin: 0.3 mg/dL (ref 0.0–1.2)
Total Protein: 7.6 g/dL (ref 6.5–8.1)

## 2024-08-13 NOTE — Progress Notes (Addendum)
 Date of COVID positive in last 90 days:  PCP - Mabel Pry, DO Cardiologist -   Chest x-ray - 08/29/23 [facility] EKG - 08/29/23 [facility] Stress Test - N/A ECHO - 04/04/22 Epic Cardiac Cath - N/A Pacemaker/ICD device last checked:N/A Spinal Cord Stimulator:N/A  Bowel Prep - N/A  Sleep Study - N/A CPAP -   Fasting Blood Sugar - N/A Checks Blood Sugar _____ times a day  Last dose of GLP1 agonist-  N/A GLP1 instructions:  Do not take after     Last dose of SGLT-2 inhibitors-  N/A SGLT-2 instructions:  Do not take after     Blood Thinner Instructions: N/A Last dose:   Time: Aspirin Instructions:N/A Last Dose:  Activity level: Can go up a flight of stairs and perform activities of daily living without stopping and without symptoms of chest pain or shortness of breath.   Anesthesia review: N/A  Patient denies shortness of breath, fever, cough and chest pain at PAT appointment  Patient verbalized understanding of instructions that were given to them at the PAT appointment. Patient was also instructed that they will need to review over the PAT instructions again at home before surgery.

## 2024-08-13 NOTE — Patient Instructions (Addendum)
 SURGICAL WAITING ROOM VISITATION  Patients having surgery or a procedure may have no more than 2 support people in the waiting area - these visitors may rotate.    Children ages 101 and under will not be able to visit patients in [facility] under most circumstances.   Visitors with respiratory illnesses are discouraged from visiting and should remain at home.  If the patient needs to stay at the hospital during part of their recovery, the visitor guidelines for inpatient rooms apply. Pre-op nurse will coordinate an appropriate time for 1 support person to accompany patient in pre-op.  This support person may not rotate.    Please refer to the [facility] website for the visitor guidelines for Inpatients (after your surgery is over and you are in a regular room).    Your procedure is scheduled on: 08/21/24   Report to [facility] Main Entrance    Report to admitting at 5:15 AM   Call this number if you have problems the morning of surgery [phone]   Do not eat food :After Midnight.   After Midnight you may have the following liquids until 4:30 AM DAY OF SURGERY  Water Non-Citrus Juices (without pulp, NO RED-Apple, White grape, White cranberry) Black Coffee (NO MILK/CREAM OR CREAMERS, sugar ok)  Clear Tea (NO MILK/CREAM OR CREAMERS, sugar ok) regular and decaf                             Plain Jell-O (NO RED)                                           Fruit ices (not with fruit pulp, NO RED)                                     Popsicles (NO RED)                                                               Sports drinks like Gatorade (NO RED)                      If you have questions, please contact your surgeons office.   FOLLOW BOWEL PREP AND ANY ADDITIONAL PRE OP INSTRUCTIONS YOU RECEIVED FROM YOUR SURGEON'S OFFICE!!!     Oral Hygiene is also important to reduce your risk of infection.                                    Remember - BRUSH YOUR TEETH THE  MORNING OF SURGERY WITH YOUR REGULAR TOOTHPASTE  DENTURES WILL BE REMOVED PRIOR TO SURGERY PLEASE DO NOT APPLY Poly grip OR ADHESIVES!!!   Stop all vitamins and herbal supplements 7 days before surgery.   Take these medicines the morning of surgery with A SIP OF WATER: Inhaler  You may not have any metal on your body including hair pins, jewelry, and body piercing             Do not wear make-up, lotions, powders, perfumes, or deodorant  Do not wear nail polish including gel and S&S, artificial/acrylic nails, or any other type of covering on natural nails including finger and toenails. If you have artificial nails, gel coating, etc. that needs to be removed by a nail salon please have this removed prior to surgery or surgery may need to be canceled/ delayed if the surgeon/ anesthesia feels like they are unable to be safely monitored.   Do not shave  48 hours prior to surgery.    Do not bring valuables to the hospital. [facility] IS NOT             RESPONSIBLE   FOR VALUABLES.   Contacts, glasses, dentures or bridgework may not be worn into surgery.  DO NOT BRING YOUR HOME MEDICATIONS TO THE HOSPITAL. PHARMACY WILL DISPENSE MEDICATIONS LISTED ON YOUR MEDICATION LIST TO YOU DURING YOUR ADMISSION IN THE HOSPITAL!    Patients discharged on the day of surgery will not be allowed to drive home.  Someone NEEDS to stay with you for the first 24 hours after anesthesia.   Special Instructions: Bring a copy of your healthcare power of attorney and living will documents the day of surgery if you haven't scanned them before.              Please read over the following fact sheets you were given: IF YOU HAVE QUESTIONS ABOUT YOUR PRE-OP INSTRUCTIONS PLEASE CALL [phone].   If you received a COVID test during your pre-op visit  it is requested that you wear a mask when out in public, stay away from anyone that may not be feeling well and notify your surgeon  if you develop symptoms. If you test positive for Covid or have been in contact with anyone that has tested positive in the last 10 days please notify you surgeon.    [facility] - Preparing for Surgery Before surgery, you can play an important role.  Because skin is not sterile, your skin needs to be as free of germs as possible.  You can reduce the number of germs on your skin by washing with CHG (chlorahexidine gluconate) soap before surgery.  CHG is an antiseptic cleaner which kills germs and bonds with the skin to continue killing germs even after washing. Please DO NOT use if you have an allergy  to CHG or antibacterial soaps.  If your skin becomes reddened/irritated stop using the CHG and inform your nurse when you arrive at Short Stay. Do not shave (including legs and underarms) for at least 48 hours prior to the first CHG shower.  You may shave your face/neck.  Please follow these instructions carefully:  1.  Shower with CHG Soap the night before surgery ONLY (DO NOT USE THE SOAP THE MORNING OF SURGERY).  2.  If you choose to wash your hair, wash your hair first as usual with your normal  shampoo.  3.  After you shampoo, rinse your hair and body thoroughly to remove the shampoo.                             4.  Use CHG as you would any other liquid soap.  You can apply chg directly to the skin and wash.  Gently with a scrungie or clean washcloth.  5.  Apply the CHG Soap to your body ONLY FROM THE NECK DOWN.   Do   not use on face/ open                           Wound or open sores. Avoid contact with eyes, ears mouth and   genitals (private parts).                       Wash face,  Genitals (private parts) with your normal soap.             6.  Wash thoroughly, paying special attention to the area where your    surgery  will be performed.  7.  Thoroughly rinse your body with warm water from the neck down.  8.  DO NOT shower/wash with your normal soap after using and rinsing off the CHG  Soap.                9.  Pat yourself dry with a clean towel.            10.  Wear clean pajamas.            11.  Place clean sheets on your bed the night of your first shower and do not  sleep with pets. Day of Surgery : Do not apply any CHG, lotions/deodorants the morning of surgery.  Please wear clean clothes to the hospital/surgery center.  FAILURE TO FOLLOW THESE INSTRUCTIONS MAY RESULT IN THE CANCELLATION OF YOUR SURGERY  PATIENT SIGNATURE_________________________________  NURSE SIGNATURE__________________________________  ________________________________________________________________________ WHAT IS A BLOOD TRANSFUSION? Blood Transfusion Information  A transfusion is the replacement of blood or some of its parts. Blood is made up of multiple cells which provide different functions. Red blood cells carry oxygen and are used for blood loss replacement. White blood cells fight against infection. Platelets control bleeding. Plasma helps clot blood. Other blood products are available for specialized needs, such as hemophilia or other clotting disorders. BEFORE THE TRANSFUSION  Who gives blood for transfusions?  Healthy volunteers who are fully evaluated to make sure their blood is safe. This is blood bank blood. Transfusion therapy is the safest it has ever been in the practice of medicine. Before blood is taken from a donor, a complete history is taken to make sure that person has no history of diseases nor engages in risky social behavior (examples are intravenous drug use or sexual activity with multiple partners). The donor's travel history is screened to minimize risk of transmitting infections, such as malaria. The donated blood is tested for signs of infectious diseases, such as HIV and hepatitis. The blood is then tested to be sure it is compatible with you in order to minimize the chance of a transfusion reaction. If you or a relative donates blood, this is often done in  anticipation of surgery and is not appropriate for emergency situations. It takes many days to process the donated blood. RISKS AND COMPLICATIONS Although transfusion therapy is very safe and saves many lives, the main dangers of transfusion include:  Getting an infectious disease. Developing a transfusion reaction. This is an allergic reaction to something in the blood you were given. Every precaution is taken to prevent this. The decision to have a blood transfusion has been considered carefully by your caregiver before blood is given. Blood is not given unless the benefits outweigh  the risks. AFTER THE TRANSFUSION Right after receiving a blood transfusion, you will usually feel much better and more energetic. This is especially true if your red blood cells have gotten low (anemic). The transfusion raises the level of the red blood cells which carry oxygen, and this usually causes an energy increase. The nurse administering the transfusion will monitor you carefully for complications. HOME CARE INSTRUCTIONS  No special instructions are needed after a transfusion. You may find your energy is better. Speak with your caregiver about any limitations on activity for underlying diseases you may have. SEEK MEDICAL CARE IF:  Your condition is not improving after your transfusion. You develop redness or irritation at the intravenous (IV) site. SEEK IMMEDIATE MEDICAL CARE IF:  Any of the following symptoms occur over the next 12 hours: Shaking chills. You have a temperature by mouth above 102 F (38.9 C), not controlled by medicine. Chest, back, or muscle pain. People around you feel you are not acting correctly or are confused. Shortness of breath or difficulty breathing. Dizziness and fainting. You get a rash or develop hives. You have a decrease in urine output. Your urine turns a dark color or changes to pink, red, or brown. Any of the following symptoms occur over the next 10 days: You have a  temperature by mouth above 102 F (38.9 C), not controlled by medicine. Shortness of breath. Weakness after normal activity. The white part of the eye turns yellow (jaundice). You have a decrease in the amount of urine or are urinating less often. Your urine turns a dark color or changes to pink, red, or brown. Document Released: 08/18/2000 Document Revised: 11/13/2011 Document Reviewed: 04/06/2008 [facility] Patient Information 2014 [place], [place].  _______________________________________________________________________

## 2024-08-14 ENCOUNTER — Ambulatory Visit: Attending: Physician Assistant | Admitting: Physician Assistant

## 2024-08-14 ENCOUNTER — Encounter: Payer: Self-pay | Admitting: Physician Assistant

## 2024-08-14 VITALS — BP 120/78 | HR 74 | Ht 68.0 in | Wt 170.6 lb

## 2024-08-14 DIAGNOSIS — Z01810 Encounter for preprocedural cardiovascular examination: Secondary | ICD-10-CM | POA: Diagnosis not present

## 2024-08-14 DIAGNOSIS — R072 Precordial pain: Secondary | ICD-10-CM | POA: Diagnosis not present

## 2024-08-14 DIAGNOSIS — R002 Palpitations: Secondary | ICD-10-CM | POA: Diagnosis not present

## 2024-08-14 DIAGNOSIS — R0789 Other chest pain: Secondary | ICD-10-CM

## 2024-08-14 DIAGNOSIS — R0602 Shortness of breath: Secondary | ICD-10-CM

## 2024-08-14 MED ORDER — VITAMIN D (ERGOCALCIFEROL) 1.25 MG (50000 UNIT) PO CAPS: 50000.0000 [IU] | ORAL_CAPSULE | ORAL | 0 refills | Status: AC

## 2024-08-14 MED ORDER — METOPROLOL TARTRATE 100 MG PO TABS: 100.0000 mg | ORAL_TABLET | Freq: Once | ORAL | 0 refills | Status: AC

## 2024-08-14 NOTE — Progress Notes (Signed)
 Cardiology Office Note    Date:  08/14/2024  ID:  Amy Reed, DOB 14-Nov-1985, MRN [id number] PCP:  Frann Mabel Mt, DO  Cardiologist:  None - New  Chief Complaint: palpitations  History of Present Illness: .    Amy Reed is a 38 y.o. female with visit-pertinent history of Lynch syndrome, asthma, GERD, habitual ETOH intake seen for evaluation of palpitations at the request of Dr. Frann. Prior echo in 2023 for dyspnea showed normal LV function EF 55-60%, no abnormalities. She recently saw PCP for palpitations and monitor is pending. She reports that she has had these symptoms for years. She reports two types of palpitations. One described as a very brief chest tightness before feeling abrupt release (max 1 minute) and the other as a fluttering sensation. These are all very brief, no sustained events. These happen a couple times a month without provoking or relieving factors and resolve spontaneously. She reports a history of chronic dyspnea without acute change recently. She does not exercise, but is able to exert 6.7 METS without angina. The majority of things she answered no to are simply because she does not do them, rather than overt limitation. She is pending hysterectomy due to h/o Lynch syndrome, abnormal bleeding, and also a lesion for which they cannot exclude malignancy.  Family History: multiple family members with leaky valves, one who had surgery (unclear what kind), paternal aunt with leaky heart valve Tobacco: none Alcohol: drinks 4 drinks 3-4x/week  Drug use: none  Labwork independently reviewed: 08/2024 K 4.3, Cr 0.90, LFTs ok , CBC wnl, Mg 1.8, A1c wnl, LDL 65, trig 144, TSH and FT4 wnl  ROS: .    Please see the history of present illness. All other systems are reviewed and otherwise negative.  Studies Reviewed: SABRA    EKG:  EKG is ordered today, personally reviewed, demonstrating  EKG Interpretation Date/Time:  Thursday August 14 2024  11:12:52 EST Ventricular Rate:  74 PR Interval:  158 QRS Duration:  78 QT Interval:  356 QTC Calculation: 395 R Axis:   50  Text Interpretation: Normal sinus rhythm Nonspecific ST and T wave abnormality When compared with ECG of 05-May-2021 18:49, No significant change was found Confirmed by Konnar Ben [phone]) on 08/14/2024 11:24:05 AM    CV Studies: Cardiac studies reviewed are outlined and summarized above. Otherwise please see EMR for full report.   Current Reported Medications:.    Prior to Admission medications  Medication Sig Start Date End Date Taking? Authorizing Provider  albuterol  (PROVENTIL ) (2.5 MG/3ML) 0.083% nebulizer solution Take 3 mLs (2.5 mg total) by nebulization every 6 (six) hours as needed for wheezing or shortness of breath. 01/02/24  Yes Billy Asberry FALCON, PA-C  EPINEPHrine  (EPIPEN  2-PAK) 0.3 mg/0.3 mL IJ SOAJ injection Inject 0.3 mg into the muscle as needed for anaphylaxis. 01/02/24  Yes Billy Asberry FALCON, PA-C  metoprolol tartrate (LOPRESSOR) 100 MG tablet Take 1 tablet (100 mg total) by mouth once. Take 90-120 minutes prior to scan. Hold for SBP less than 110. 08/14/24 08/14/24 Yes Nathanyel Defenbaugh N, PA-C  Vitamin D , Ergocalciferol , (DRISDOL ) 1.25 MG (50000 UNIT) CAPS capsule Take 1 capsule (50,000 Units total) by mouth every 7 (seven) days. 08/07/24 10/30/24 Yes Frann Mabel Mt, DO     Physical Exam:    VS:  BP 120/78   Pulse 74   Ht 5' 8 (1.727 m)   Wt 170 lb 9.6 oz (77.4 kg)   LMP 08/07/2024   SpO2 99%  BMI 25.94 kg/m    Wt Readings from Last 3 Encounters:  08/14/24 170 lb 9.6 oz (77.4 kg)  08/13/24 165 lb (74.8 kg)  08/06/24 168 lb 12.8 oz (76.6 kg)    GEN: Well nourished, well developed in no acute distress NECK: No JVD; No carotid bruits CARDIAC: RRR, no murmurs, rubs, gallops RESPIRATORY:  Clear to auscultation without rales, wheezing or rhonchi  ABDOMEN: Soft, non-tender, non-distended EXTREMITIES:  No edema; No acute deformity    Asessement and Plan:.    1. Palpitations - episodic for years, fleeting. Some mixed features, question PACs/PVCs vs PSVT. Primary care has ordered a monitor which she confirms she has received. The results will come to PCP; would ask that they forward us  the result when it returns so we can review and advise further. Labs were OK. If specific arrhythmia discovered, can consider magnesium supplementation along with beta blocker suppression. Also discussed potential role of habitual ETOH in provocation of palpitations and suggestion for reduction.  2. Atypical chest pain, SOB - generally atypical symptoms without accelerating features, but still warranting updated workup. EKG shows NSR with nonspecific STTW changes similar to prior. Will obtain updated echocardiogram and baseline coronary CTA. See below regarding timing of studies.  3. Preoperative cardiovascular exam - discussed case with Dr. Wonda (DOD) given atypical symptoms but pending surgery 12/18 for hysterectomy and evaluation of abnormal uterine lesion. Her symptoms are generally chronic without recent progression. She is able to complete 6.7 METS without angina. Prior echo showed normal LV function. Exam is benign. EKG is stable compared to prior. Dr. Wonda and I do not think it is necessary to delay her surgery as she is generally felt to be low risk. She will keep us  apprised if she develops any acute change in symptoms prior to surgery. Otherwise, will proceed with workup above, but do not feel it needs to be completed before her surgery. I will cc this note to OBGYN so they are aware. Would monitor on telemetry during her surgery and notify for any new concerns.    Disposition: F/u with me in 3 months  [id number]  Signed, Callahan Wild N Earleen Aoun, PA-C

## 2024-08-14 NOTE — Patient Instructions (Signed)
 Medication Instructions:  Your physician recommends that you continue on your current medications as directed. Please refer to the Current Medication list given to you today.  *If you need a refill on your cardiac medications before your next appointment, please call your pharmacy*  Lab Work: None ordered  If you have labs (blood work) drawn today and your tests are completely normal, you will receive your results only by: MyChart Message (if you have MyChart) OR A paper copy in the mail If you have any lab test that is abnormal or we need to change your treatment, we will call you to review the results.  Testing/Procedures: Your physician has requested that you have an echocardiogram. Echocardiography is a painless test that uses sound waves to create images of your heart. It provides your doctor with information about the size and shape of your heart and how well your hearts chambers and valves are working. This procedure takes approximately one hour. There are no restrictions for this procedure. Please do NOT wear cologne, perfume, aftershave, or lotions (deodorant is allowed). Please arrive 15 minutes prior to your appointment time.  Please note: We ask at that you not bring children with you during ultrasound (echo/ vascular) testing. Due to room size and safety concerns, children are not allowed in the ultrasound rooms during exams. Our front office staff cannot provide observation of children in our lobby area while testing is being conducted. An adult accompanying a patient to their appointment will only be allowed in the ultrasound room at the discretion of the ultrasound technician under special circumstances. We apologize for any inconvenience.   Your physician has requested that you have cardiac CT. Cardiac computed tomography (CT) is a painless test that uses an x-ray machine to take clear, detailed pictures of your heart. For further information please visit [email or web address]  Please follow instruction sheet as BELOW:    Your cardiac CT will be scheduled at one of the below locations:   [facility] [address] [place], [place] [address] [phone] (Severe contrast allergies only)  OR   [facility] [address] [place], [place] [address] [phone]  OR   [facility] [address] [place], [place] [address] [phone]  OR   [facility] and V[facility] Tower [address]  [place], [place] [address]  OR   [facility] [address] [place], [place] [phone]  If scheduled at [facility], please arrive at the [facility] and Children's Entrance (Entrance C2) of [facility] 30 minutes prior to test start time. You can use the FREE valet parking offered at entrance C (encouraged to control the heart rate for the test)  Proceed to the [facility] Radiology Department (first floor) to check-in and test prep.  All radiology patients and guests should use entrance C2 at [facility], accessed from [facility], even though the hospital's physical address listed is [address]  If scheduled at the Heart and Vascular Tower at [address], please enter the parking lot using the [address] entrance and use the FREE valet service at the patient drop-off area. Enter the building and check-in with registration on the main floor.  If scheduled at [facility], please arrive to the Heart and Vascular Center 15 mins early for check-in and test prep.  There is spacious parking and easy access to the radiology department from the [facility] Heart and Vascular entrance.  Please enter here and check-in with the desk attendant.   If scheduled at [facility], please arrive 30 minutes early for check-in and test prep.  Please follow these instructions carefully (unless otherwise directed):  An IV will be  required for this test and Nitroglycerin will be given.    On the Night Before the Test: Be sure to Drink plenty of water. Do not consume any caffeinated/decaffeinated beverages or chocolate 12 hours prior to your test. Do not take any antihistamines 12 hours prior to your test.  On the Day of the Test: Drink plenty of water until 1 hour prior to the test. Do not eat any food 1 hour prior to test. You may take your regular medications prior to the test.  Take metoprolol (Lopressor) 100 MG  two hours prior to test THIS HAS BEEN SENT TO [facility]- please wear underwire-free bra if available, avoid dresses & tight clothing         After the Test: Drink plenty of water. After receiving IV contrast, you may experience a mild flushed feeling. This is normal. On occasion, you may experience a mild rash up to 24 hours after the test. This is not dangerous. If this occurs, you can take Benadryl  25 mg, Zyrtec, Claritin, or Allegra and increase your fluid intake. (Patients taking Tikosyn should avoid Benadryl , and may take Zyrtec, Claritin, or Allegra) If you experience trouble breathing, this can be serious. If it is severe call 911 IMMEDIATELY. If it is mild, please call our office.  We will call to schedule your test 2-4 weeks out understanding that some insurance companies will need an authorization prior to the service being performed.   For more information and frequently asked questions, please visit our website : [email or web address]  For non-scheduling related questions, please contact the cardiac imaging nurse navigator should you have any questions/concerns: Cardiac Imaging Nurse Navigators Direct Office Dial: [phone]   For scheduling needs, including cancellations and rescheduling, please call Brittany, [phone].   Follow-Up: At [facility], you and your health needs are our priority.  As part of our continuing mission to provide you with  exceptional heart care, our providers are all part of one team.  This team includes your primary Cardiologist (physician) and Advanced Practice Providers or APPs (Physician Assistants and Nurse Practitioners) who all work together to provide you with the care you need, when you need it.  Your next appointment:   3 month(s)  Provider:   Dayna Dunn, PA-C          We recommend signing up for the patient portal called MyChart.  Sign up information is provided on this After Visit Summary.  MyChart is used to connect with patients for Virtual Visits (Telemedicine).  Patients are able to view lab/test results, encounter notes, upcoming appointments, etc.  Non-urgent messages can be sent to your provider as well.   To learn more about what you can do with MyChart, go to [email or web address].   Other Instructions

## 2024-08-14 NOTE — Addendum Note (Signed)
 Addended by: Corliss Lamartina D on: 08/14/2024 03:15 PM   Modules accepted: Orders

## 2024-08-15 ENCOUNTER — Telehealth: Payer: Self-pay | Admitting: *Deleted

## 2024-08-15 ENCOUNTER — Telehealth: Payer: Self-pay | Admitting: Physician Assistant

## 2024-08-15 NOTE — Telephone Encounter (Signed)
 Amy Reed sent a message to notify us  of her Cold symptoms - cough and sneezing. She reports the symptoms to be very mild. Advised her to monitor her symptoms over the weekend and if her symptoms get worse have testing for covid and flu. If she obtains testing and she is positive please call us  or if her symptoms worsen.

## 2024-08-20 ENCOUNTER — Other Ambulatory Visit: Payer: Self-pay | Admitting: Family Medicine

## 2024-08-20 ENCOUNTER — Telehealth: Payer: Self-pay | Admitting: *Deleted

## 2024-08-20 DIAGNOSIS — R928 Other abnormal and inconclusive findings on diagnostic imaging of breast: Secondary | ICD-10-CM

## 2024-08-20 NOTE — Telephone Encounter (Signed)
 Telephone call to check on pre-operative status.  Patient compliant with pre-operative instructions.  Reinforced nothing to eat after midnight. Clear liquids until 0415. Patient to arrive at 0515. Pt states she had cold symptoms last Friday, 12/12 and is now feeling better. She is only having mild post nasal drip, denies fever, sore throat and had a mild dry cough last night but better this morning. Pt also states she does not have COVID or the FLU.  No questions or concerns voiced.  Instructed to call for any needs.

## 2024-08-21 ENCOUNTER — Ambulatory Visit (HOSPITAL_COMMUNITY)

## 2024-08-21 ENCOUNTER — Other Ambulatory Visit: Payer: Self-pay

## 2024-08-21 ENCOUNTER — Ambulatory Visit (HOSPITAL_COMMUNITY)
Admission: RE | Admit: 2024-08-21 | Discharge: 2024-08-23 | Disposition: A | Discharge status: Home / Self Care | Source: Ambulatory Visit | Attending: Gynecologic Oncology | Admitting: Gynecologic Oncology

## 2024-08-21 ENCOUNTER — Encounter (HOSPITAL_COMMUNITY): Admitting: Medical

## 2024-08-21 ENCOUNTER — Encounter (HOSPITAL_COMMUNITY): Payer: Self-pay | Admitting: Gynecologic Oncology

## 2024-08-21 ENCOUNTER — Encounter: Admission: RE | Disposition: A | Payer: Self-pay | Attending: Gynecologic Oncology

## 2024-08-21 DIAGNOSIS — N939 Abnormal uterine and vaginal bleeding, unspecified: Secondary | ICD-10-CM

## 2024-08-21 DIAGNOSIS — Z1509 Genetic susceptibility to other malignant neoplasm: Secondary | ICD-10-CM | POA: Diagnosis present

## 2024-08-21 DIAGNOSIS — Z803 Family history of malignant neoplasm of breast: Secondary | ICD-10-CM | POA: Diagnosis not present

## 2024-08-21 DIAGNOSIS — N8302 Follicular cyst of left ovary: Secondary | ICD-10-CM | POA: Insufficient documentation

## 2024-08-21 DIAGNOSIS — Z1507 Genetic susceptibility to malignant neoplasm of urinary tract: Secondary | ICD-10-CM

## 2024-08-21 DIAGNOSIS — N938 Other specified abnormal uterine and vaginal bleeding: Secondary | ICD-10-CM | POA: Insufficient documentation

## 2024-08-21 DIAGNOSIS — N8301 Follicular cyst of right ovary: Secondary | ICD-10-CM | POA: Insufficient documentation

## 2024-08-21 DIAGNOSIS — J45909 Unspecified asthma, uncomplicated: Secondary | ICD-10-CM | POA: Insufficient documentation

## 2024-08-21 DIAGNOSIS — K219 Gastro-esophageal reflux disease without esophagitis: Secondary | ICD-10-CM | POA: Insufficient documentation

## 2024-08-21 DIAGNOSIS — Z15060 Genetic susceptibility to colorectal cancer: Secondary | ICD-10-CM | POA: Insufficient documentation

## 2024-08-21 DIAGNOSIS — Z15068 Genetic susceptibility to other malignant neoplasm of digestive system: Secondary | ICD-10-CM | POA: Diagnosis not present

## 2024-08-21 DIAGNOSIS — N840 Polyp of corpus uteri: Secondary | ICD-10-CM | POA: Diagnosis not present

## 2024-08-21 DIAGNOSIS — Z808 Family history of malignant neoplasm of other organs or systems: Secondary | ICD-10-CM | POA: Insufficient documentation

## 2024-08-21 DIAGNOSIS — D251 Intramural leiomyoma of uterus: Secondary | ICD-10-CM | POA: Diagnosis not present

## 2024-08-21 DIAGNOSIS — N841 Polyp of cervix uteri: Secondary | ICD-10-CM | POA: Insufficient documentation

## 2024-08-21 DIAGNOSIS — Z8049 Family history of malignant neoplasm of other genital organs: Secondary | ICD-10-CM | POA: Insufficient documentation

## 2024-08-21 DIAGNOSIS — Z148 Genetic carrier of other disease: Secondary | ICD-10-CM

## 2024-08-21 DIAGNOSIS — Z800 Family history of malignant neoplasm of digestive organs: Secondary | ICD-10-CM | POA: Insufficient documentation

## 2024-08-21 HISTORY — PX: ROBOTIC ASSISTED TOTAL HYSTERECTOMY WITH BILATERAL SALPINGO OOPHERECTOMY: SHX6086

## 2024-08-21 LAB — TYPE AND SCREEN
ABO/RH(D): AB POS
Antibody Screen: NEGATIVE

## 2024-08-21 LAB — ABO/RH: ABO/RH(D): AB POS

## 2024-08-21 LAB — POCT PREGNANCY, URINE: Preg Test, Ur: NEGATIVE

## 2024-08-21 SURGERY — HYSTERECTOMY, TOTAL, ROBOT-ASSISTED, LAPAROSCOPIC, WITH BILATERAL SALPINGO-OOPHORECTOMY
Anesthesia: General | Site: Abdomen | Laterality: Bilateral

## 2024-08-21 MED ORDER — FENTANYL CITRATE (PF) 50 MCG/ML IJ SOSY
PREFILLED_SYRINGE | INTRAMUSCULAR | Status: AC
  Filled 2024-08-21: qty 1

## 2024-08-21 MED ORDER — LIDOCAINE HCL (CARDIAC) PF 100 MG/5ML IV SOSY
PREFILLED_SYRINGE | INTRAVENOUS | Status: DC | PRN
  Administered 2024-08-21: 08:00:00 60 mg via INTRAVENOUS

## 2024-08-21 MED ORDER — MIDAZOLAM HCL 5 MG/5ML IJ SOLN
INTRAMUSCULAR | Status: DC | PRN
  Administered 2024-08-21: 08:00:00 2 mg via INTRAVENOUS

## 2024-08-21 MED ORDER — BUPIVACAINE HCL (PF) 0.25 % IJ SOLN
INTRAMUSCULAR | Status: AC
  Filled 2024-08-21: qty 30

## 2024-08-21 MED ORDER — SUGAMMADEX SODIUM 200 MG/2ML IV SOLN
INTRAVENOUS | Status: AC
  Filled 2024-08-21: qty 2

## 2024-08-21 MED ORDER — EPINEPHRINE 0.3 MG/0.3ML IJ SOAJ: 0.3000 mg | INTRAMUSCULAR | Status: DC | PRN

## 2024-08-21 MED ORDER — HEPARIN SODIUM (PORCINE) 5000 UNIT/ML IJ SOLN
5000.0000 [IU] | INTRAMUSCULAR | Status: AC
  Administered 2024-08-21: 06:00:00 5000 [IU] via SUBCUTANEOUS
  Filled 2024-08-21: qty 1

## 2024-08-21 MED ORDER — ONDANSETRON HCL 4 MG PO TABS: 4.0000 mg | ORAL_TABLET | Freq: Four times a day (QID) | ORAL | Status: DC | PRN

## 2024-08-21 MED ORDER — BUPIVACAINE HCL 0.25 % IJ SOLN
INTRAMUSCULAR | Status: DC | PRN
  Administered 2024-08-21: 08:00:00 26 mL

## 2024-08-21 MED ORDER — SENNOSIDES-DOCUSATE SODIUM 8.6-50 MG PO TABS
2.0000 | ORAL_TABLET | Freq: Every day | ORAL | Status: DC
  Administered 2024-08-21 – 2024-08-22 (×2): 2 via ORAL
  Filled 2024-08-21 (×2): qty 2

## 2024-08-21 MED ORDER — LIDOCAINE HCL (PF) 2 % IJ SOLN
INTRAMUSCULAR | Status: AC
  Filled 2024-08-21: qty 5

## 2024-08-21 MED ORDER — HYDROMORPHONE HCL 1 MG/ML IJ SOLN: 0.5000 mg | INTRAMUSCULAR | Status: DC | PRN

## 2024-08-21 MED ORDER — SUGAMMADEX SODIUM 200 MG/2ML IV SOLN
INTRAVENOUS | Status: DC | PRN
  Administered 2024-08-21: 09:00:00 300 mg via INTRAVENOUS

## 2024-08-21 MED ORDER — SCOPOLAMINE 1 MG/3DAYS TD PT72
1.0000 | MEDICATED_PATCH | TRANSDERMAL | Status: DC
  Administered 2024-08-21: 06:00:00 1 mg via TRANSDERMAL
  Filled 2024-08-21: qty 1

## 2024-08-21 MED ORDER — PHENYLEPHRINE HCL (PRESSORS) 10 MG/ML IV SOLN
INTRAVENOUS | Status: AC
  Filled 2024-08-21: qty 1

## 2024-08-21 MED ORDER — STERILE WATER FOR IRRIGATION IR SOLN
Status: DC | PRN
  Administered 2024-08-21: 08:00:00 1000 mL

## 2024-08-21 MED ORDER — CEFAZOLIN SODIUM-DEXTROSE 2-4 GM/100ML-% IV SOLN
2.0000 g | INTRAVENOUS | Status: AC
  Administered 2024-08-21: 08:00:00 2 g via INTRAVENOUS
  Filled 2024-08-21: qty 100

## 2024-08-21 MED ORDER — DEXMEDETOMIDINE HCL IN NACL 80 MCG/20ML IV SOLN
INTRAVENOUS | Status: AC
  Filled 2024-08-21: qty 20

## 2024-08-21 MED ORDER — PROPOFOL 10 MG/ML IV BOLUS
INTRAVENOUS | Status: AC
  Filled 2024-08-21: qty 20

## 2024-08-21 MED ORDER — TRAMADOL HCL 50 MG PO TABS: 50.0000 mg | ORAL_TABLET | Freq: Four times a day (QID) | ORAL | Status: DC | PRN

## 2024-08-21 MED ORDER — FENTANYL CITRATE (PF) 100 MCG/2ML IJ SOLN
INTRAMUSCULAR | Status: DC | PRN
  Administered 2024-08-21: 08:00:00 50 ug via INTRAVENOUS
  Administered 2024-08-21: 08:00:00 150 ug via INTRAVENOUS

## 2024-08-21 MED ORDER — ACETAMINOPHEN 500 MG PO TABS: 1000.0000 mg | ORAL_TABLET | Freq: Once | ORAL | Status: DC

## 2024-08-21 MED ORDER — KETAMINE HCL 50 MG/5ML IJ SOSY
PREFILLED_SYRINGE | INTRAMUSCULAR | Status: DC | PRN
  Administered 2024-08-21: 08:00:00 10 mg via INTRAVENOUS
  Administered 2024-08-21: 08:00:00 20 mg via INTRAVENOUS

## 2024-08-21 MED ORDER — LACTATED RINGERS IV SOLN: INTRAVENOUS | Status: DC

## 2024-08-21 MED ORDER — DEXAMETHASONE SOD PHOSPHATE PF 10 MG/ML IJ SOLN
4.0000 mg | INTRAMUSCULAR | Status: AC
  Administered 2024-08-21: 08:00:00 5 mg via INTRAVENOUS

## 2024-08-21 MED ORDER — ACETAMINOPHEN 500 MG PO TABS
1000.0000 mg | ORAL_TABLET | Freq: Four times a day (QID) | ORAL | Status: DC
  Administered 2024-08-21 – 2024-08-23 (×5): 1000 mg via ORAL
  Filled 2024-08-21 (×6): qty 2

## 2024-08-21 MED ORDER — PROPOFOL 10 MG/ML IV BOLUS
INTRAVENOUS | Status: DC | PRN
  Administered 2024-08-21: 08:00:00 200 mg via INTRAVENOUS
  Administered 2024-08-21: 08:00:00 120 ug/kg/min via INTRAVENOUS

## 2024-08-21 MED ORDER — SENNOSIDES-DOCUSATE SODIUM 8.6-50 MG PO TABS: 2.0000 | ORAL_TABLET | Freq: Every day | ORAL | 0 refills | Status: DC

## 2024-08-21 MED ORDER — OXYCODONE HCL 5 MG PO TABS
5.0000 mg | ORAL_TABLET | ORAL | Status: DC | PRN
  Administered 2024-08-21 – 2024-08-23 (×6): 10 mg via ORAL
  Filled 2024-08-21 (×7): qty 2

## 2024-08-21 MED ORDER — ESMOLOL HCL 100 MG/10ML IV SOLN
INTRAVENOUS | Status: DC | PRN
  Administered 2024-08-21: 08:00:00 10 mg via INTRAVENOUS
  Administered 2024-08-21: 08:00:00 20 mg via INTRAVENOUS
  Administered 2024-08-21: 08:00:00 10 mg via INTRAVENOUS

## 2024-08-21 MED ORDER — ORAL CARE MOUTH RINSE: 15.0000 mL | Freq: Once | OROMUCOSAL | Status: AC

## 2024-08-21 MED ORDER — ONDANSETRON HCL 4 MG/2ML IJ SOLN: 4.0000 mg | Freq: Four times a day (QID) | INTRAMUSCULAR | Status: DC | PRN

## 2024-08-21 MED ORDER — CHLORHEXIDINE GLUCONATE 0.12 % MT SOLN
15.0000 mL | Freq: Once | OROMUCOSAL | Status: AC
  Administered 2024-08-21: 06:00:00 15 mL via OROMUCOSAL

## 2024-08-21 MED ORDER — LABETALOL HCL 5 MG/ML IV SOLN
INTRAVENOUS | Status: DC | PRN
  Administered 2024-08-21: 08:00:00 2.5 mg via INTRAVENOUS
  Administered 2024-08-21: 08:00:00 5 mg via INTRAVENOUS
  Administered 2024-08-21: 08:00:00 2.5 mg via INTRAVENOUS

## 2024-08-21 MED ORDER — GABAPENTIN 300 MG PO CAPS
300.0000 mg | ORAL_CAPSULE | ORAL | Status: AC
  Administered 2024-08-21: 06:00:00 300 mg via ORAL
  Filled 2024-08-21: qty 1

## 2024-08-21 MED ORDER — ESTRADIOL 0.1 MG/24HR TD PTWK: 0.1000 mg | MEDICATED_PATCH | TRANSDERMAL | 12 refills | Status: AC

## 2024-08-21 MED ORDER — ESMOLOL HCL 100 MG/10ML IV SOLN
INTRAVENOUS | Status: AC
  Filled 2024-08-21: qty 10

## 2024-08-21 MED ORDER — OXYCODONE HCL 5 MG/5ML PO SOLN: 5.0000 mg | Freq: Once | ORAL | Status: DC | PRN

## 2024-08-21 MED ORDER — ROCURONIUM BROMIDE 100 MG/10ML IV SOLN
INTRAVENOUS | Status: DC | PRN
  Administered 2024-08-21 (×2): 20 mg via INTRAVENOUS
  Administered 2024-08-21: 09:00:00 10 mg via INTRAVENOUS
  Administered 2024-08-21: 08:00:00 60 mg via INTRAVENOUS

## 2024-08-21 MED ORDER — METRONIDAZOLE 500 MG/100ML IV SOLN
500.0000 mg | INTRAVENOUS | Status: AC
  Administered 2024-08-21: 08:00:00 500 mg via INTRAVENOUS
  Filled 2024-08-21: qty 100

## 2024-08-21 MED ORDER — KETOROLAC TROMETHAMINE 30 MG/ML IJ SOLN
INTRAMUSCULAR | Status: DC | PRN
  Administered 2024-08-21: 09:00:00 15 mg via INTRAVENOUS

## 2024-08-21 MED ORDER — ENOXAPARIN SODIUM 40 MG/0.4ML IJ SOSY
40.0000 mg | PREFILLED_SYRINGE | INTRAMUSCULAR | Status: DC
  Administered 2024-08-22 – 2024-08-23 (×2): 40 mg via SUBCUTANEOUS
  Filled 2024-08-21 (×2): qty 0.4

## 2024-08-21 MED ORDER — OXYCODONE HCL 5 MG PO TABS: 5.0000 mg | ORAL_TABLET | Freq: Once | ORAL | Status: DC | PRN

## 2024-08-21 MED ORDER — PROPOFOL 1000 MG/100ML IV EMUL
INTRAVENOUS | Status: AC
  Filled 2024-08-21: qty 100

## 2024-08-21 MED ORDER — ROCURONIUM BROMIDE 10 MG/ML (PF) SYRINGE
PREFILLED_SYRINGE | INTRAVENOUS | Status: AC
  Filled 2024-08-21: qty 10

## 2024-08-21 MED ORDER — KETOROLAC TROMETHAMINE 30 MG/ML IJ SOLN
INTRAMUSCULAR | Status: AC
  Filled 2024-08-21: qty 1

## 2024-08-21 MED ORDER — DROPERIDOL 2.5 MG/ML IJ SOLN: 0.6250 mg | Freq: Once | INTRAMUSCULAR | Status: DC | PRN

## 2024-08-21 MED ORDER — KETAMINE HCL 50 MG/5ML IJ SOSY
PREFILLED_SYRINGE | INTRAMUSCULAR | Status: AC
  Filled 2024-08-21: qty 5

## 2024-08-21 MED ORDER — OXYCODONE HCL 5 MG PO TABS: 5.0000 mg | ORAL_TABLET | ORAL | 0 refills | Status: AC | PRN

## 2024-08-21 MED ORDER — ONDANSETRON HCL 4 MG/2ML IJ SOLN
INTRAMUSCULAR | Status: AC
  Filled 2024-08-21: qty 2

## 2024-08-21 MED ORDER — LACTATED RINGERS IR SOLN
Status: DC | PRN
  Administered 2024-08-21: 08:00:00 1000 mL

## 2024-08-21 MED ORDER — LABETALOL HCL 5 MG/ML IV SOLN
INTRAVENOUS | Status: AC
  Filled 2024-08-21: qty 4

## 2024-08-21 MED ORDER — MIDAZOLAM HCL 2 MG/2ML IJ SOLN
INTRAMUSCULAR | Status: AC
  Filled 2024-08-21: qty 2

## 2024-08-21 MED ORDER — PROPOFOL 500 MG/50ML IV EMUL
INTRAVENOUS | Status: AC
  Filled 2024-08-21: qty 50

## 2024-08-21 MED ORDER — ALBUTEROL SULFATE (2.5 MG/3ML) 0.083% IN NEBU: 2.5000 mg | INHALATION_SOLUTION | Freq: Four times a day (QID) | RESPIRATORY_TRACT | Status: DC | PRN

## 2024-08-21 MED ORDER — ACETAMINOPHEN 500 MG PO TABS
1000.0000 mg | ORAL_TABLET | ORAL | Status: AC
  Administered 2024-08-21: 06:00:00 1000 mg via ORAL
  Filled 2024-08-21: qty 2

## 2024-08-21 MED ORDER — FENTANYL CITRATE (PF) 50 MCG/ML IJ SOSY
25.0000 ug | PREFILLED_SYRINGE | INTRAMUSCULAR | Status: DC | PRN
  Administered 2024-08-21 (×3): 50 ug via INTRAVENOUS

## 2024-08-21 MED ORDER — DEXMEDETOMIDINE HCL IN NACL 80 MCG/20ML IV SOLN
INTRAVENOUS | Status: DC | PRN
  Administered 2024-08-21 (×2): 8 ug via INTRAVENOUS

## 2024-08-21 MED ORDER — ONDANSETRON HCL 4 MG/2ML IJ SOLN
INTRAMUSCULAR | Status: DC | PRN
  Administered 2024-08-21: 09:00:00 4 mg via INTRAVENOUS

## 2024-08-21 MED FILL — Fentanyl Citrate Preservative Free (PF) Inj 250 MCG/5ML: INTRAMUSCULAR | Qty: 5 | Status: AC

## 2024-08-21 MED FILL — Propofol IV Emul 1000 MG/100ML (10 MG/ML): INTRAVENOUS | Qty: 100 | Status: AC

## 2024-08-21 MED FILL — Rocuronium Bromide IV Soln Pref Syr 100 MG/10ML (10 MG/ML): INTRAVENOUS | Qty: 10 | Status: AC

## 2024-08-21 SURGICAL SUPPLY — 71 items
APPLICATOR SURGIFLO ENDO (HEMOSTASIS) IMPLANT
BAG COUNTER SPONGE SURGICOUNT (BAG) IMPLANT
BAG LAPAROSCOPIC 12 15 PORT 16 (BASKET) IMPLANT
BLADE SURG SZ10 CARB STEEL (BLADE) IMPLANT
COVER BACK TABLE 60X90IN (DRAPES) ×1 IMPLANT
COVER TIP SHEARS 8 DVNC (MISCELLANEOUS) ×1 IMPLANT
DERMABOND ADVANCED .7 DNX12 (GAUZE/BANDAGES/DRESSINGS) ×1 IMPLANT
DRAPE ARM DVNC X/XI (DISPOSABLE) ×4 IMPLANT
DRAPE COLUMN DVNC XI (DISPOSABLE) ×1 IMPLANT
DRAPE SHEET LG 3/4 BI-LAMINATE (DRAPES) ×1 IMPLANT
DRAPE SURG IRRIG POUCH 19X23 (DRAPES) ×1 IMPLANT
DRIVER NDL MEGA SUTCUT DVNCXI (INSTRUMENTS) ×1 IMPLANT
DRIVER NDLE MEGA SUTCUT DVNCXI (INSTRUMENTS) ×1 IMPLANT
DRSG OPSITE POSTOP 4X6 (GAUZE/BANDAGES/DRESSINGS) IMPLANT
DRSG OPSITE POSTOP 4X8 (GAUZE/BANDAGES/DRESSINGS) IMPLANT
ELECT PENCIL ROCKER SW 15FT (MISCELLANEOUS) IMPLANT
ELECT REM PT RETURN 15FT ADLT (MISCELLANEOUS) ×1 IMPLANT
FORCEPS BPLR FENES DVNC XI (FORCEP) ×1 IMPLANT
FORCEPS PROGRASP DVNC XI (FORCEP) ×1 IMPLANT
GAUZE 4X4 16PLY ~~LOC~~+RFID DBL (SPONGE) ×2 IMPLANT
GLOVE BIO SURGEON STRL SZ 6 (GLOVE) ×4 IMPLANT
GLOVE BIO SURGEON STRL SZ 6.5 (GLOVE) ×1 IMPLANT
GOWN STRL REUS W/ TWL LRG LVL3 (GOWN DISPOSABLE) ×4 IMPLANT
GRASPER SUT TROCAR 14GX15 (MISCELLANEOUS) IMPLANT
HOLDER FOLEY CATH W/STRAP (MISCELLANEOUS) IMPLANT
IRRIGATION SUCT STRKRFLW 2 WTP (MISCELLANEOUS) ×1 IMPLANT
KIT PROCEDURE DVNC SI (MISCELLANEOUS) IMPLANT
KIT TURNOVER KIT A (KITS) ×1 IMPLANT
LIGASURE IMPACT 36 18CM CVD LR (INSTRUMENTS) IMPLANT
MANIPULATOR ADVINCU DEL 3.0 PL (MISCELLANEOUS) IMPLANT
MANIPULATOR ADVINCU DEL 3.5 PL (MISCELLANEOUS) IMPLANT
MANIPULATOR UTERINE 4.5 ZUMI (MISCELLANEOUS) IMPLANT
NDL HYPO 21X1.5 SAFETY (NEEDLE) ×1 IMPLANT
NDL SPNL 18GX3.5 QUINCKE PK (NEEDLE) IMPLANT
NEEDLE HYPO 21X1.5 SAFETY (NEEDLE) ×1 IMPLANT
NEEDLE SPNL 18GX3.5 QUINCKE PK (NEEDLE) IMPLANT
OBTURATOR OPTICALSTD 8 DVNC (TROCAR) ×1 IMPLANT
PACK ROBOT GYN CUSTOM WL (TRAY / TRAY PROCEDURE) ×1 IMPLANT
PAD POSITIONING PINK XL (MISCELLANEOUS) ×1 IMPLANT
PORT ACCESS TROCAR AIRSEAL 12 (TROCAR) IMPLANT
SCISSORS LAP 5X45 EPIX DISP (ENDOMECHANICALS) IMPLANT
SCISSORS MNPLR CVD DVNC XI (INSTRUMENTS) ×1 IMPLANT
SCRUB CHG 4% DYNA-HEX 4OZ (MISCELLANEOUS) IMPLANT
SEAL UNIV 5-12 XI (MISCELLANEOUS) ×4 IMPLANT
SET TRI-LUMEN FLTR TB AIRSEAL (TUBING) ×1 IMPLANT
SPIKE FLUID TRANSFER (MISCELLANEOUS) ×1 IMPLANT
SPONGE T-LAP 18X18 ~~LOC~~+RFID (SPONGE) IMPLANT
SURGIFLO W/THROMBIN 8M KIT (HEMOSTASIS) IMPLANT
SUT MNCRL AB 4-0 PS2 18 (SUTURE) IMPLANT
SUT PDS AB 1 TP1 96 (SUTURE) IMPLANT
SUT STRATA PDS 0 30 CT-2.5 (SUTURE) IMPLANT
SUT STRATAFIX PDS+0 CT1 9 (SUTURE) IMPLANT
SUT V-LOC 180 0-0 GS22 (SUTURE) IMPLANT
SUT VIC AB 0 CT1 27XBRD ANTBC (SUTURE) IMPLANT
SUT VIC AB 2-0 CT1 TAPERPNT 27 (SUTURE) IMPLANT
SUT VIC AB 2-0 SH 27X BRD (SUTURE) IMPLANT
SUT VIC AB 4-0 PS2 18 (SUTURE) ×2 IMPLANT
SUT VICRYL 0 27 CT2 27 ABS (SUTURE) ×1 IMPLANT
SUT VLOC 180 0 9IN GS21 (SUTURE) IMPLANT
SUTURE STRATFX 0 PDS+ CT-2 23 (SUTURE) IMPLANT
SYR 10ML LL (SYRINGE) IMPLANT
SYSTEM BAG RETRIEVAL 10MM (BASKET) IMPLANT
SYSTEM RETRIEVL 5MM INZII UNIV (BASKET) IMPLANT
SYSTEM WOUND ALEXIS 18CM MED (MISCELLANEOUS) IMPLANT
TRAP SPECIMEN MUCUS 40CC (MISCELLANEOUS) IMPLANT
TRAY FOLEY MTR SLVR 16FR STAT (SET/KITS/TRAYS/PACK) ×1 IMPLANT
TROCAR PORT AIRSEAL 5X120 (TROCAR) IMPLANT
TROCAR XCEL NON-BLD 5MMX100MML (ENDOMECHANICALS) IMPLANT
UNDERPAD 30X36 HEAVY ABSORB (UNDERPADS AND DIAPERS) ×2 IMPLANT
WATER STERILE IRR 1000ML POUR (IV SOLUTION) ×1 IMPLANT
YANKAUER SUCT BULB TIP 10FT TU (MISCELLANEOUS) IMPLANT

## 2024-08-21 NOTE — H&P (Signed)
 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  Reports multiple family members with cancer diagnoses in their 16s.   Pelvic ultrasound 07/23/24: Echogenic endometrial lesion likely representing an endometrial polyp. Intracavitary/submucosal fibroid is in differential diagnosis. Malignancy can not be excluded in this high-risk patient.  Recommend close attention on follow-up exam. Subserosal hypoechoic uterine fibroid in anterior body (FIGO 5).  Interval History: Doing well.  Past Medical/Surgical History: Past Medical History:  Diagnosis Date   Allergy     Asthma    Family history of colon cancer    GERD (gastroesophageal reflux disease)    Headache    d/t allergies   Lynch syndrome     Past Surgical History:  Procedure Laterality Date   14 HOUR PH STUDY N/A 10/10/2017   Procedure: 24 HOUR PH STUDY-OFF of PPI;  Surgeon: Shila Gustav GAILS, MD;  Location: [facility];  Service: Endoscopy;  Laterality: N/A;   DILATION AND CURETTAGE OF UTERUS     ESOPHAGEAL MANOMETRY N/A 10/10/2017   Procedure: ESOPHAGEAL MANOMETRY (EM);  Surgeon: Shila Gustav GAILS, MD;  Location: [facility];  Service: Endoscopy;  Laterality: N/A;   PH IMPEDANCE STUDY N/A 10/10/2017   Procedure: PH IMPEDANCE STUDY-OFF of PPI;  Surgeon: Shila Gustav GAILS, MD;  Location: [facility];  Service: Endoscopy;  Laterality: N/A;    Family History  Problem Relation Age of Onset   Cervical cancer Mother    Colon cancer Father 45   Prostate cancer Father    Colon polyps Sister    Cervical polyp Sister    Skin cancer Sister    Colon polyps Brother    Diabetes Maternal Grandmother    Diabetes Maternal Grandfather    Breast cancer Paternal Grandmother    Diabetes Paternal Grandmother    Cervical cancer Paternal Grandmother    Diabetes Paternal Grandfather    Colon cancer Paternal Grandfather    Colon  cancer Paternal Aunt    Breast cancer Paternal Aunt    Colon cancer Paternal Aunt    Valvular heart disease Other        Multiple family members   Esophageal cancer Neg Hx    Stomach cancer Neg Hx    Rectal cancer Neg Hx    Sleep apnea Neg Hx     Social History   Socioeconomic History   Marital status: Married    Spouse name: Not on file   Number of children: Not on file   Years of education: Not on file   Highest education level: Some college, no degree  Occupational History   Not on file  Tobacco Use   Smoking status: Never   Smokeless tobacco: Never  Vaping Use   Vaping status: Never Used  Substance and Sexual Activity   Alcohol use: Yes    Alcohol/week: 5.0 standard drinks of alcohol    Types: 5 Standard drinks or equivalent per week    Comment: 3-4x a week   Drug use: No   Sexual activity: Yes    Partners: Female    Birth control/protection: None  Other Topics Concern   Not on file  Social History Narrative   1 cup of caffeine daily    Social Drivers of Health   Tobacco Use: Low Risk (08/21/2024)   Patient History    Smoking Tobacco Use: Never    Smokeless Tobacco Use: Never    Passive Exposure: Not  on file  Recent Concern: Tobacco Use - High Risk (08/06/2024)   Patient History    Smoking Tobacco Use: Some Days    Smokeless Tobacco Use: Never    Passive Exposure: Not on file  Financial Resource Strain: Patient Declined (08/05/2024)   Overall Financial Resource Strain (CARDIA)    Difficulty of Paying Living Expenses: Patient declined  Food Insecurity: Patient Declined (08/05/2024)   Epic    Worried About Programme Researcher, Broadcasting/film/video in the Last Year: Patient declined    Barista in the Last Year: Patient declined  Transportation Needs: No Transportation Needs (08/05/2024)   Epic    Lack of Transportation (Medical): No    Lack of Transportation (Non-Medical): No  Physical Activity: Insufficiently Active (08/05/2024)   Exercise Vital Sign    Days of  Exercise per Week: 2 days    Minutes of Exercise per Session: 30 min  Stress: No Stress Concern Present (08/05/2024)   [employer] of Occupational Health - Occupational Stress Questionnaire    Feeling of Stress: Only a little  Social Connections: Unknown (08/05/2024)   Social Connection and Isolation Panel    Frequency of Communication with Friends and Family: More than three times a week    Frequency of Social Gatherings with Friends and Family: Once a week    Attends Religious Services: Patient declined    Database Administrator or Organizations: Patient declined    Attends Banker Meetings: Not on file    Marital Status: Patient declined  Depression (PHQ2-9): Low Risk (08/06/2024)   Depression (PHQ2-9)    PHQ-2 Score: 0  Alcohol Screen: Medium Risk (08/05/2024)   Alcohol Screen    Last Alcohol Screening Score (AUDIT): 8  Housing: Unknown (08/05/2024)   Epic    Unable to Pay for Housing in the Last Year: No    Number of Times Moved in the Last Year: Not on file    Homeless in the Last Year: Patient declined  Utilities: Not on file  Health Literacy: Not on file    Current Medications: Current Medications[1]  Review of Systems: + fatigue, shortness of breath, vaginal discharge, menstrual problems, headache Denies appetite changes, fevers, chills, fatigue, unexplained weight changes. Denies hearing loss, neck lumps or masses, mouth sores, ringing in ears or voice changes. Denies cough or wheezing.  Denies shortness of breath. Denies chest pain or palpitations. Denies leg swelling. Denies abdominal distention, pain, blood in stools, constipation, diarrhea, nausea, vomiting, or early satiety. Denies pain with intercourse, dysuria, frequency, hematuria or incontinence. Denies hot flashes, pelvic pain, vaginal bleeding or vaginal discharge.   Denies joint pain, back pain or muscle pain/cramps. Denies itching, rash, or wounds. Denies dizziness, headaches, numbness or  seizures. Denies swollen lymph nodes or glands, denies easy bruising or bleeding. Denies anxiety, depression, confusion, or decreased concentration.  Physical Exam: BP (!) 142/91   Pulse (!) 102   Temp 98.3 F (36.8 C) (Oral)   Resp 16   Wt 170 lb 9.6 oz (77.4 kg)   LMP 08/01/2024   SpO2 100%   BMI 25.94 kg/m  General: Alert, oriented, no acute distress.  HEENT: Normocephalic, atraumatic. Sclera anicteric.  Chest: Unlabored breathing on room air. Abdomen: Soft, nondistended, nontender to palpation. No masses or hepatosplenomegaly appreciated. No palpable fluid wave.  Extremities: Grossly normal range of motion. Warm, well perfused. No edema bilaterally.   Laboratory & Radiologic Studies:    Latest Ref Rng & Units 08/13/2024   11:26 AM  08/06/2024    2:13 PM 04/18/2024   11:27 AM  CBC  WBC 4.0 - 10.5 K/uL 5.1  5.5  4.0   Hemoglobin 12.0 - 15.0 g/dL 85.9  86.5  86.3   Hematocrit 36.0 - 46.0 % 43.0  40.1  41.3   Platelets 150 - 400 K/uL 226  217.0  204.0       Latest Ref Rng & Units 08/13/2024   11:26 AM 08/06/2024    2:13 PM 04/18/2024   11:27 AM  BMP  Glucose 70 - 99 mg/dL 82  88  79   BUN 6 - 20 mg/dL 12  11  13    Creatinine 0.44 - 1.00 mg/dL 9.09  9.08  9.12   Sodium 135 - 145 mmol/L 140  139  138   Potassium 3.5 - 5.1 mmol/L 4.3  3.8  4.2   Chloride 98 - 111 mmol/L 104  105  103   CO2 22 - 32 mmol/L 26  23  27    Calcium 8.9 - 10.3 mg/dL 9.7  9.5  9.6    Assessment & Plan: Amy Reed is a 38 y.o. woman with Lynch syndrome, abnormal uterine bleeding.   Plan for definitive surgery today per note on 11/14.  Comer Dollar, MD  Division of Gynecologic Oncology  Department of Obstetrics and Gynecology  [facility]      [1]  Current Facility-Administered Medications:    ceFAZolin  (ANCEF ) IVPB 2g/100 mL premix, 2 g, Intravenous, On Call to OR, Cross, Melissa D, NP   dexamethasone  (DECADRON ) injection 4 mg, 4 mg, Intravenous, On  Call to OR, Cross, Eleanor D, NP   lactated ringers  infusion, , Intravenous, Continuous, Epifanio Charleston, MD   metroNIDAZOLE  (FLAGYL ) IVPB 500 mg, 500 mg, Intravenous, On Call to OR, Cross, Melissa D, NP   scopolamine  (TRANSDERM-SCOP) 1 MG/3DAYS 1 mg, 1 patch, Transdermal, On Call to OR, Cross, Melissa D, NP, 1 mg at 08/21/24 [phone]

## 2024-08-21 NOTE — Transfer of Care (Signed)
 Immediate Anesthesia Transfer of Care Note  Patient: Amy Reed  Procedure(s) Performed: HYSTERECTOMY, TOTAL, ROBOT-ASSISTED, LAPAROSCOPIC, WITH BILATERAL SALPINGO-OOPHORECTOMY (Bilateral: Abdomen)  Patient Location: PACU  Anesthesia Type:General  Level of Consciousness: drowsy  Airway & Oxygen Therapy: Patient Spontanous Breathing and Patient connected to face mask oxygen  Post-op Assessment: Report given to RN and Post -op Vital signs reviewed and stable  Post vital signs: Reviewed and stable  Last Vitals:  Vitals Value Taken Time  BP    Temp    Pulse 86 08/21/24 09:43  Resp 16 08/21/24 09:43  SpO2 100 % 08/21/24 09:43  Vitals shown include unfiled device data.  Last Pain:  Vitals:   08/21/24 0606  TempSrc:   PainSc: 0-No pain         Complications: No notable events documented.

## 2024-08-21 NOTE — Op Note (Signed)
 OPERATIVE NOTE  Pre-operative Diagnosis: Lynch syndromes, AUB, endometrial mass  Post-operative Diagnosis: same, suspected benign endometrial polyp on gross  Operation: Robotic-assisted laparoscopic total hysterectomy with bilateral salpingo-oophorectomy  Surgeon: Viktoria Crank MD  Assistant Surgeon: Eleanor Epps, NP (an NP assistant was necessary for tissue manipulation, management of robotic instrumentation, retraction and positioning due to the complexity of the case and hospital policies).   Anesthesia: GET  Urine Output: 350 cc  Operative Findings: On EUA, mobile uterus, no adnexal mass. On intra-abdominal entry, normal upper abdominal survey. Normal omentum, small and large bowel, appendix. Appendix somewhat adherent to the IP ligament at the pelvic brim. Uterus 8-10 cm and mildly bulbous. Normal appearing bilateral adnexa. NO ascites. NO peritoneal findings. Uterus sent for frozen - on gross review, benign appearing endometrial polyp, no endometrial findings otherwise.   Estimated Blood Loss:  50 cc      Total IV Fluids: see I&O flowsheet         Specimens: uterus, cervix, bilateral tubes and ovaries, pelvic washings         Complications:  None apparent; patient tolerated the procedure well.         Disposition: PACU - hemodynamically stable.  Procedure Details  The patient was seen in the Holding Room. The risks, benefits, complications, treatment options, and expected outcomes were discussed with the patient.  The patient concurred with the proposed plan, giving informed consent.  The site of surgery properly noted/marked. The patient was identified as Engineer, Petroleum and the procedure verified as a Robotic-assisted hysterectomy with bilateral salpingo oophorectomy.   After induction of anesthesia, the patient was draped and prepped in the usual sterile manner. Patient was placed in supine position after anesthesia and draped and prepped in the usual sterile manner as  follows: Her arms were tucked to her side with all appropriate precautions.  The patient was secured to the bed using padding and tape across her chest.  The patient was placed in the semi-lithotomy position in Chambers stirrups.  The perineum and vagina were prepped with CHG. The patient's abdomen was prepped with ChloraPrep and then she was draped after the prep had been allowed to dry for 3 minutes.  A Time Out was held and the above information confirmed.  The urethra was prepped with Betadine. Foley catheter was placed.  A sterile speculum was placed in the vagina.  The cervix was grasped with a single-tooth tenaculum. The cervix was dilated with Fredirick dilators.  The ZUMI uterine manipulator with a medium colpotomizer ring was placed without difficulty.  A pneum occluder balloon was placed over the manipulator.  OG tube placement was confirmed and to suction.   Next, a 5 mm skin incision was made 1 cm below the subcostal margin in the midclavicular line.  The 5 mm Optiview port and scope was used for direct entry.  Opening pressure was under 10 mm CO2.  The abdomen was insufflated and the findings were noted as above.   At this point and all points during the procedure, the patient's intra-abdominal pressure did not exceed 15 mmHg. Next, an 8 mm skin incision was made within the umbilicus and a right and left port were placed about 8 cm lateral to the robot port on the right and left side.  The 5 mm assist trocar was exchanged for a 5 mm airseal port. All ports were placed under direct visualization.  The patient was placed in steep Trendelenburg.  Bowel was folded away into the upper abdomen.  The robot was docked in the normal manner.  Pelvic washings collected.  The right and left peritoneum were opened parallel to the IP ligament to open the retroperitoneal spaces bilaterally. The round ligaments were transected. The ureter was noted to be on the medial leaf of the broad ligament. On the right, the  appendix was mobilized from the IP ligament using a combination of short bursts of monopolar electrocautery and sharp dissection.  The peritoneum above the ureter was incised and stretched and the infundibulopelvic ligament was skeletonized, cauterized and cut, ensuring 2+ cm of IP ligament taken proximal to the tube and ovary.   The posterior peritoneum was taken down to the level of the KOH ring.  The anterior peritoneum was also taken down.  The bladder flap was created to the level of the KOH ring.  The uterine artery on the right side was skeletonized, cauterized and cut in the normal manner.  A similar procedure was performed on the left.  The colpotomy was made and the uterus, cervix, bilateral ovaries and tubes were amputated and delivered through the vagina.  Pedicles were inspected and excellent hemostasis was achieved.    The colpotomy at the vaginal cuff was closed with 0 Vicryl with a figure of eight at each apex and 0 Stratafix to close the midportion of the cuff in 2 layers in a running manner.  Irrigation was used and excellent hemostasis was achieved.  At this point in the procedure was completed.  Robotic instruments were removed under direct visulaization.  The robot was undocked. Gas was removed from the abdomen. The subcuticular tissue was closed with 4-0 Vicryl and the skin was closed with 4-0 Monocryl in a subcuticular manner.  Dermabond was applied.    The vagina was swabbed with minimal bleeding noted. Foley catheter was removed.  All sponge, lap and needle counts were correct x  3.   The patient was transferred to the recovery room in stable condition.  Comer Dollar, MD

## 2024-08-21 NOTE — Anesthesia Postprocedure Evaluation (Signed)
 Anesthesia Post Note  Patient: Amy Reed  Procedure(s) Performed: HYSTERECTOMY, TOTAL, ROBOT-ASSISTED, LAPAROSCOPIC, WITH BILATERAL SALPINGO-OOPHORECTOMY (Bilateral: Abdomen)     Patient location during evaluation: PACU Anesthesia Type: General Level of consciousness: awake and alert Pain management: pain level controlled Vital Signs Assessment: post-procedure vital signs reviewed and stable Respiratory status: spontaneous breathing, nonlabored ventilation, respiratory function stable and patient connected to nasal cannula oxygen Cardiovascular status: blood pressure returned to baseline and stable Postop Assessment: no apparent nausea or vomiting Anesthetic complications: no   No notable events documented.  Last Vitals:  Vitals:   08/21/24 1445 08/21/24 1506  BP: 129/79 (!) 143/97  Pulse: 74 84  Resp: 17 17  Temp:  36.9 C  SpO2: 100% 100%    Last Pain:  Vitals:   08/21/24 1542  TempSrc:   PainSc: 5                  Epifanio Lamar BRAVO

## 2024-08-21 NOTE — Anesthesia Procedure Notes (Signed)
 Procedure Name: Intubation Date/Time: 08/21/2024 7:39 AM  Performed by: Belvie Valri NOVAK, CRNAPre-anesthesia Checklist: Patient identified, Emergency Drugs available, Suction available and Patient being monitored Patient Re-evaluated:Patient Re-evaluated prior to induction Oxygen Delivery Method: Circle System Utilized Preoxygenation: Pre-oxygenation with 100% oxygen Induction Type: IV induction Ventilation: Mask ventilation without difficulty Laryngoscope Size: Mac and 3 Grade View: Grade I Tube type: Oral Tube size: 7.0 mm Number of attempts: 1 Airway Equipment and Method: Stylet and Oral airway Placement Confirmation: ETT inserted through vocal cords under direct vision, positive ETCO2 and breath sounds checked- equal and bilateral Secured at: 22 cm Tube secured with: Tape Dental Injury: Teeth and Oropharynx as per pre-operative assessment

## 2024-08-21 NOTE — Anesthesia Preprocedure Evaluation (Addendum)
 Anesthesia Evaluation  Patient identified by MRN, date of birth, ID band Patient awake    Reviewed: Allergy  & Precautions, NPO status , Patient's Chart, lab work & pertinent test results  Airway Mallampati: II  TM Distance: >3 FB Neck ROM: Full    Dental  (+) Dental Advisory Given   Pulmonary asthma    breath sounds clear to auscultation       Cardiovascular negative cardio ROS  Rhythm:Regular Rate:Normal     Neuro/Psych negative neurological ROS     GI/Hepatic Neg liver ROS,GERD  ,,  Endo/Other  negative endocrine ROS    Renal/GU negative Renal ROS     Musculoskeletal   Abdominal   Peds  Hematology negative hematology ROS (+)   Anesthesia Other Findings   Reproductive/Obstetrics                              Anesthesia Physical Anesthesia Plan  ASA: 2  Anesthesia Plan: General   Post-op Pain Management: Tylenol  PO (pre-op)*, Toradol  IV (intra-op)* and Ketamine  IV*   Induction: Intravenous  PONV Risk Score and Plan: 4 or greater and Dexamethasone , Ondansetron , Midazolam , Scopolamine  patch - Pre-op and Propofol  infusion  Airway Management Planned: Oral ETT  Additional Equipment: None  Intra-op Plan:   Post-operative Plan: Extubation in OR  Informed Consent: I have reviewed the patients History and Physical, chart, labs and discussed the procedure including the risks, benefits and alternatives for the proposed anesthesia with the patient or authorized representative who has indicated his/her understanding and acceptance.     Dental advisory given  Plan Discussed with: CRNA  Anesthesia Plan Comments:          Anesthesia Quick Evaluation

## 2024-08-22 ENCOUNTER — Encounter (HOSPITAL_COMMUNITY): Payer: Self-pay | Admitting: Gynecologic Oncology

## 2024-08-22 DIAGNOSIS — N938 Other specified abnormal uterine and vaginal bleeding: Secondary | ICD-10-CM | POA: Diagnosis not present

## 2024-08-22 LAB — CBC
HCT: 35.9 % — ABNORMAL LOW (ref 36.0–46.0)
Hemoglobin: 11.9 g/dL — ABNORMAL LOW (ref 12.0–15.0)
MCH: 33 pg (ref 26.0–34.0)
MCHC: 33.1 g/dL (ref 30.0–36.0)
MCV: 99.4 fL (ref 80.0–100.0)
Platelets: 209 K/uL (ref 150–400)
RBC: 3.61 MIL/uL — ABNORMAL LOW (ref 3.87–5.11)
RDW: 12.1 % (ref 11.5–15.5)
WBC: 10.8 K/uL — ABNORMAL HIGH (ref 4.0–10.5)
nRBC: 0 % (ref 0.0–0.2)

## 2024-08-22 LAB — BASIC METABOLIC PANEL WITH GFR
Anion gap: 10 (ref 5–15)
BUN: 8 mg/dL (ref 6–20)
CO2: 24 mmol/L (ref 22–32)
Calcium: 9 mg/dL (ref 8.9–10.3)
Chloride: 101 mmol/L (ref 98–111)
Creatinine, Ser: 0.77 mg/dL (ref 0.44–1.00)
GFR, Estimated: >60 mL/min
Glucose, Bld: 113 mg/dL — ABNORMAL HIGH (ref 70–99)
Potassium: 3.8 mmol/L (ref 3.5–5.1)
Sodium: 135 mmol/L (ref 135–145)

## 2024-08-22 LAB — HEMOGLOBIN AND HEMATOCRIT, BLOOD
HCT: 37.7 % (ref 36.0–46.0)
Hemoglobin: 12.5 g/dL (ref 12.0–15.0)

## 2024-08-22 MED ORDER — METHOCARBAMOL 500 MG PO TABS
500.0000 mg | ORAL_TABLET | Freq: Four times a day (QID) | ORAL | Status: DC | PRN
  Administered 2024-08-22 – 2024-08-23 (×3): 500 mg via ORAL
  Filled 2024-08-22 (×3): qty 1

## 2024-08-22 MED ORDER — SIMETHICONE 80 MG PO CHEW
80.0000 mg | CHEWABLE_TABLET | Freq: Three times a day (TID) | ORAL | Status: DC
  Administered 2024-08-22 – 2024-08-23 (×3): 80 mg via ORAL
  Filled 2024-08-22 (×3): qty 1

## 2024-08-22 NOTE — Plan of Care (Signed)
   Problem: Clinical Measurements: Goal: Diagnostic test results will improve Outcome: Not Progressing

## 2024-08-22 NOTE — Progress Notes (Signed)
 1 Day Post-Op Procedures (LRB): HYSTERECTOMY, TOTAL, ROBOT-ASSISTED, LAPAROSCOPIC, WITH BILATERAL SALPINGO-OOPHORECTOMY (Bilateral)  Subjective: Patient reports having productive cough. Blood tinged intermittently. Has not had solid food this am. No nausea or emesis reported. Pain not relieved by oxycodone . Ambulated in the halls last pm. Voiding without difficulty. No flatus or BM. States her grandmother died of pneumonia after having a hysterectomy. Denies chest pain, dyspnea.   Objective: Vital signs in last 24 hours: Temp:  [97.5 F (36.4 C)-98.5 F (36.9 C)] 98.2 F (36.8 C) (12/19 0518) Pulse Rate:  [61-88] 61 (12/19 0518) Resp:  [9-19] 15 (12/19 0518) BP: (109-143)/(58-107) 123/70 (12/19 0518) SpO2:  [95 %-100 %] 100 % (12/19 0518) Weight:  [170 lb 13.7 oz (77.5 kg)] 170 lb 13.7 oz (77.5 kg) (12/18 1514) Last BM Date : 08/20/24  Intake/Output from previous day: 12/18 0701 - 12/19 0700 In: 2064.8 [P.O.:600; I.V.:1264.8; IV Piggyback:200] Out: 850 [Urine:800; Blood:50]  Physical Examination: General: alert, cooperative, and no distress Resp: clear to auscultation bilaterally Cardio: regular rate and rhythm, S1, S2 normal, no murmur, click, rub or gallop GI: incision: lap site incisions to the abdomen intact with dermabond with no active drainage and abdomen is soft, hypoactive bowel sounds, appropriately tender on palpation.  Extremities: extremities normal, atraumatic, no cyanosis or edema SCDs placed.  Labs: WBC/Hgb/Hct/Plts:  10.8/11.9/35.9/209 (12/19 0501) BUN/Cr/glu/ALT/AST/amyl/lip:  8/0.77/--/--/--/--/-- (12/19 0501)  Assessment: 38 y.o. s/p Procedures: HYSTERECTOMY, TOTAL, ROBOT-ASSISTED, LAPAROSCOPIC, WITH BILATERAL SALPINGO-OOPHORECTOMY: stable. Given upper respiratory symptoms, anesthesia recommended extended monitoring.   Pain: Per pt, pain is not controlled with oral medications. Will make adjustments.  Heme: Hgb 11.9 and Hct 35.9 this am. Repeat labs  ordered to assess for stability given drop.   ID: WBC 10.8 this am. Afebrile. Continue to monitor temperature.  CV: BP and HR stable. Continue to monitor while inpatient.  GI:  Tolerating po: has not had solid food. Tray to be ordered this am.   GU: Voiding since surgery. Continue to monitor output.    FEN: No critical values on am labs.  Prophylaxis: Lovenox  and SCDs.  Plan: Repeat H&H at 13:00 today Diet as tolerated Will make adjustments to pain medications Continue pulmonary toileting If meeting milestones and respiratory status is stable, will plan for discharge tomorrow per Dr. Viktoria   LOS: 0 days    Amy Reed 08/22/2024, 9:23 AM

## 2024-08-22 NOTE — Progress Notes (Signed)
" °   08/22/24 1101  TOC Brief Assessment  Insurance and Status Reviewed  Patient has primary care physician Yes  Home environment has been reviewed resides in a private residence  Prior level of function: Independent  Prior/Current Home Services No current home services  Social Drivers of Health Review SDOH reviewed no interventions necessary  Readmission risk has been reviewed Yes  Transition of care needs no transition of care needs at this time    "

## 2024-08-23 DIAGNOSIS — N938 Other specified abnormal uterine and vaginal bleeding: Secondary | ICD-10-CM | POA: Diagnosis not present

## 2024-08-23 NOTE — Plan of Care (Signed)
   Problem: Coping: Goal: Level of anxiety will decrease Outcome: Progressing

## 2024-08-23 NOTE — Discharge Summary (Signed)
 Physician Discharge Summary  Patient ID: Amy Reed MRN: [id number] DOB/AGE: 1986-07-11 38 y.o.  Admit date: 08/21/2024 Discharge date: 08/23/2024  Admission Diagnoses: Lynch syndrome  Discharge Diagnoses:  Principal Problem:   Lynch syndrome Active Problems:   Abnormal uterine bleeding   Discharged Condition: good  Hospital Course: On 08/21/2024, the patient underwent the following: Procedures: HYSTERECTOMY, TOTAL, ROBOT-ASSISTED, LAPAROSCOPIC, WITH BILATERAL SALPINGO-OOPHORECTOMY.   The postoperative course was complicated by a URI she had contracted preoperatively. There was mild postop anemia that remained stable. She was discharged to home on postoperative day 2 tolerating a regular diet.   Consults: None  Significant Diagnostic Studies: None  Treatments: surgery: see above  Discharge Exam: Blood pressure 115/74, pulse 85, temperature 99 F (37.2 C), temperature source Oral, resp. rate 18, height 5' 8 (1.727 m), weight 77.5 kg, last menstrual period 08/01/2024, SpO2 99%. General appearance: alert Resp: clear to auscultation bilaterally GI: softly distended, NT Extremities: extremities normal, atraumatic, no cyanosis or edema and Homans sign is negative, no sign of DVT Incision/Wound: C/D/I  Disposition: Discharge disposition: 01-Home or Self Care       Discharge Instructions     Call MD for:  extreme fatigue   Complete by: As directed    Call MD for:  persistant dizziness or light-headedness   Complete by: As directed    Call MD for:  persistant nausea and vomiting   Complete by: As directed    Call MD for:  redness, tenderness, or signs of infection (pain, swelling, redness, odor or green/yellow discharge around incision site)   Complete by: As directed    Call MD for:  severe uncontrolled pain   Complete by: As directed    Call MD for:  temperature >100.4   Complete by: As directed       Allergies as of 08/23/2024       Reactions    Shellfish Allergy  Anaphylaxis   Latex Itching        Medication List     TAKE these medications    albuterol  (2.5 MG/3ML) 0.083% nebulizer solution Commonly known as: PROVENTIL  Take 3 mLs (2.5 mg total) by nebulization every 6 (six) hours as needed for wheezing or shortness of breath.   EPINEPHrine  0.3 mg/0.3 mL Soaj injection Commonly known as: EpiPen  2-Pak Inject 0.3 mg into the muscle as needed for anaphylaxis.   estradiol  0.1 mg/24hr patch Commonly known as: CLIMARA  - Dosed in mg/24 hr Place 1 patch (0.1 mg total) onto the skin once a week.   metoprolol  tartrate 100 MG tablet Commonly known as: Lopressor  Take 1 tablet (100 mg total) by mouth once. Take 90-120 minutes prior to scan. Hold for SBP less than 110.   oxyCODONE  5 MG immediate release tablet Commonly known as: Oxy IR/ROXICODONE  Take 1 tablet (5 mg total) by mouth every 4 (four) hours as needed for severe pain (pain score 7-10). For AFTER surgery only, do not take and drive   senna-docusate 1.3-49 MG tablet Commonly known as: Senokot-S Take 2 tablets by mouth at bedtime. For AFTER surgery, do not take if having diarrhea   Vitamin D  (Ergocalciferol ) 1.25 MG (50000 UNIT) Caps capsule Commonly known as: DRISDOL  Take 1 capsule (50,000 Units total) by mouth every 7 (seven) days.         Signed: Olam Mill 08/23/2024, 9:59 AM

## 2024-08-23 NOTE — Plan of Care (Signed)

## 2024-08-23 NOTE — Progress Notes (Signed)
 Assessment unchanged. Pt verbalized understanding of dc instructions including medications, follow up care and when to call the MD office. Discharged via wc to front entrance accompanied by friend and NT.

## 2024-08-25 ENCOUNTER — Telehealth: Payer: Self-pay | Admitting: *Deleted

## 2024-08-25 ENCOUNTER — Ambulatory Visit: Payer: Self-pay | Admitting: Gynecologic Oncology

## 2024-08-25 LAB — SURGICAL PATHOLOGY

## 2024-08-25 LAB — CYTOLOGY - NON PAP

## 2024-08-25 NOTE — Telephone Encounter (Signed)
 Spoke with Amy Reed this morning. She states she is eating, drinking and urinating well. She has not had a BM yet but is passing gas. She is taking senokot as prescribed and encouraged her to drink plenty of water  and advised to take miralax today, 1 capful twice daily until BM. Add back in as needed.  She denies fever or chills. Incisions are dry and intact. She rates her pain 6/10. Her pain is controlled with tylenol  and oxycodone .     Instructed to call office with any fever, chills, purulent drainage, uncontrolled pain or any other questions or concerns. Patient verbalizes understanding.   Pt aware of post op appointments as well as the office number [phone] and after hours number [phone] to call if she has any questions or concerns

## 2024-08-27 ENCOUNTER — Other Ambulatory Visit: Payer: Self-pay

## 2024-08-27 ENCOUNTER — Telehealth: Payer: Self-pay

## 2024-08-27 ENCOUNTER — Other Ambulatory Visit: Payer: Self-pay | Admitting: Oncology

## 2024-08-27 ENCOUNTER — Inpatient Hospital Stay: Attending: Gynecologic Oncology

## 2024-08-27 DIAGNOSIS — Z8049 Family history of malignant neoplasm of other genital organs: Secondary | ICD-10-CM | POA: Insufficient documentation

## 2024-08-27 DIAGNOSIS — R300 Dysuria: Secondary | ICD-10-CM

## 2024-08-27 DIAGNOSIS — Z833 Family history of diabetes mellitus: Secondary | ICD-10-CM | POA: Insufficient documentation

## 2024-08-27 DIAGNOSIS — Z83719 Family history of colon polyps, unspecified: Secondary | ICD-10-CM | POA: Diagnosis not present

## 2024-08-27 DIAGNOSIS — Z90710 Acquired absence of both cervix and uterus: Secondary | ICD-10-CM | POA: Insufficient documentation

## 2024-08-27 DIAGNOSIS — N939 Abnormal uterine and vaginal bleeding, unspecified: Secondary | ICD-10-CM | POA: Diagnosis present

## 2024-08-27 DIAGNOSIS — J45909 Unspecified asthma, uncomplicated: Secondary | ICD-10-CM | POA: Diagnosis not present

## 2024-08-27 DIAGNOSIS — Z90722 Acquired absence of ovaries, bilateral: Secondary | ICD-10-CM | POA: Diagnosis not present

## 2024-08-27 DIAGNOSIS — Z803 Family history of malignant neoplasm of breast: Secondary | ICD-10-CM | POA: Diagnosis not present

## 2024-08-27 DIAGNOSIS — R0989 Other specified symptoms and signs involving the circulatory and respiratory systems: Secondary | ICD-10-CM | POA: Diagnosis not present

## 2024-08-27 DIAGNOSIS — B379 Candidiasis, unspecified: Secondary | ICD-10-CM | POA: Diagnosis not present

## 2024-08-27 DIAGNOSIS — Z79899 Other long term (current) drug therapy: Secondary | ICD-10-CM | POA: Diagnosis not present

## 2024-08-27 DIAGNOSIS — Z808 Family history of malignant neoplasm of other organs or systems: Secondary | ICD-10-CM | POA: Insufficient documentation

## 2024-08-27 DIAGNOSIS — Z800 Family history of malignant neoplasm of digestive organs: Secondary | ICD-10-CM | POA: Insufficient documentation

## 2024-08-27 DIAGNOSIS — N390 Urinary tract infection, site not specified: Secondary | ICD-10-CM

## 2024-08-27 DIAGNOSIS — Z1509 Genetic susceptibility to other malignant neoplasm: Secondary | ICD-10-CM | POA: Diagnosis not present

## 2024-08-27 LAB — URINALYSIS, COMPLETE (UACMP) WITH MICROSCOPIC
Bilirubin Urine: NEGATIVE
Glucose, UA: NEGATIVE mg/dL
Ketones, ur: NEGATIVE mg/dL
Nitrite: NEGATIVE
Protein, ur: NEGATIVE mg/dL
Specific Gravity, Urine: 1.005 (ref 1.005–1.030)
pH: 6 (ref 5.0–8.0)

## 2024-08-27 NOTE — Telephone Encounter (Signed)
 Amy Reed came for the lab, Urinalysis. She is aware once we get the results the office will give her a call. She states she will get a spritz bottle and use as recommended by Dr.Tucker.  She is also going to send her PCP a message regarding the head and chest congestion.

## 2024-08-27 NOTE — Telephone Encounter (Signed)
 Called pt appt schedule for Friday.

## 2024-08-27 NOTE — Telephone Encounter (Signed)
 S/P Robotic-assisted laparoscopic total hysterectomy with bilateral salpingo-oophorectomy 12/18 with Dr.Tucker  I reached out to Amy Reed, regarding a Mychart message with a concern of pain and pressure at urethra with urination only, no urgency/frequency. Sporadic pinkish discharge when wipes.No vaginal bleeding No odor. Incisions dry/intact, having BM's, eating/drinking.   She also has the head cold, chest congestion with productive cough with clear mucous. She did have a fever 2 days ago but not now. Pt reports she has not called her PCP regarding this.  Pt declined going to PCP or urgent care, wants to come in today.   Aware message will be sent to MD for advice, aware office closes at 1:00 today for holiday.   Discussed with Amy Sanes RN, pt is scheduled for a lab appointment today at 11:00. Order placed for urinalysis and culture.

## 2024-08-28 LAB — URINE CULTURE: Culture: 20000 — AB

## 2024-08-29 ENCOUNTER — Ambulatory Visit: Payer: Self-pay | Admitting: Gynecologic Oncology

## 2024-08-29 ENCOUNTER — Other Ambulatory Visit: Payer: Self-pay | Admitting: Gynecologic Oncology

## 2024-08-29 ENCOUNTER — Encounter (HOSPITAL_COMMUNITY): Payer: Self-pay

## 2024-08-29 DIAGNOSIS — N390 Urinary tract infection, site not specified: Secondary | ICD-10-CM

## 2024-08-29 MED ORDER — AMOXICILLIN 500 MG PO CAPS: 500.0000 mg | ORAL_CAPSULE | Freq: Three times a day (TID) | ORAL | 0 refills | Status: AC

## 2024-08-29 NOTE — Telephone Encounter (Signed)
-----   Message from Comer Dollar, MD sent at 08/29/2024  7:42 AM EST ----- Please call this patient to let her know below message

## 2024-08-29 NOTE — Telephone Encounter (Signed)
 Amy Reed received Mychart message from Dr.Tucker regarding the urine culture results. She is starting the abx today. Reports still having a little pressure with urination. She will complete the abx and call back if S&S are not better.

## 2024-08-31 ENCOUNTER — Emergency Department (HOSPITAL_COMMUNITY)

## 2024-08-31 ENCOUNTER — Encounter (HOSPITAL_COMMUNITY): Payer: Self-pay

## 2024-08-31 ENCOUNTER — Emergency Department (HOSPITAL_COMMUNITY)
Admission: EM | Admit: 2024-08-31 | Discharge: 2024-08-31 | Disposition: A | Discharge status: Home / Self Care | Attending: Emergency Medicine | Admitting: Emergency Medicine

## 2024-08-31 ENCOUNTER — Other Ambulatory Visit: Payer: Self-pay

## 2024-08-31 DIAGNOSIS — R059 Cough, unspecified: Secondary | ICD-10-CM | POA: Diagnosis not present

## 2024-08-31 DIAGNOSIS — R0602 Shortness of breath: Secondary | ICD-10-CM | POA: Insufficient documentation

## 2024-08-31 DIAGNOSIS — R0789 Other chest pain: Secondary | ICD-10-CM | POA: Diagnosis not present

## 2024-08-31 DIAGNOSIS — R1030 Lower abdominal pain, unspecified: Secondary | ICD-10-CM | POA: Diagnosis not present

## 2024-08-31 DIAGNOSIS — Z79899 Other long term (current) drug therapy: Secondary | ICD-10-CM | POA: Insufficient documentation

## 2024-08-31 DIAGNOSIS — Z91040 Latex allergy status: Secondary | ICD-10-CM | POA: Diagnosis not present

## 2024-08-31 DIAGNOSIS — R300 Dysuria: Secondary | ICD-10-CM | POA: Diagnosis present

## 2024-08-31 DIAGNOSIS — N939 Abnormal uterine and vaginal bleeding, unspecified: Secondary | ICD-10-CM | POA: Diagnosis not present

## 2024-08-31 DIAGNOSIS — N898 Other specified noninflammatory disorders of vagina: Secondary | ICD-10-CM

## 2024-08-31 LAB — CBC WITH DIFFERENTIAL/PLATELET
Abs Immature Granulocytes: 0.02 K/uL (ref 0.00–0.07)
Basophils Absolute: 0 K/uL (ref 0.0–0.1)
Basophils Relative: 1 %
Eosinophils Absolute: 0.3 K/uL (ref 0.0–0.5)
Eosinophils Relative: 3 %
HCT: 39.3 % (ref 36.0–46.0)
Hemoglobin: 12.9 g/dL (ref 12.0–15.0)
Immature Granulocytes: 0 %
Lymphocytes Relative: 23 %
Lymphs Abs: 1.8 K/uL (ref 0.7–4.0)
MCH: 32.6 pg (ref 26.0–34.0)
MCHC: 32.8 g/dL (ref 30.0–36.0)
MCV: 99.2 fL (ref 80.0–100.0)
Monocytes Absolute: 0.5 K/uL (ref 0.1–1.0)
Monocytes Relative: 7 %
Neutro Abs: 5 K/uL (ref 1.7–7.7)
Neutrophils Relative %: 66 %
Platelets: 306 K/uL (ref 150–400)
RBC: 3.96 MIL/uL (ref 3.87–5.11)
RDW: 11.7 % (ref 11.5–15.5)
WBC: 7.6 K/uL (ref 4.0–10.5)
nRBC: 0 % (ref 0.0–0.2)

## 2024-08-31 LAB — URINALYSIS, ROUTINE W REFLEX MICROSCOPIC
Bilirubin Urine: NEGATIVE
Glucose, UA: NEGATIVE mg/dL
Ketones, ur: NEGATIVE mg/dL
Nitrite: NEGATIVE
Protein, ur: NEGATIVE mg/dL
Specific Gravity, Urine: 1.012 (ref 1.005–1.030)
pH: 6 (ref 5.0–8.0)

## 2024-08-31 LAB — COMPREHENSIVE METABOLIC PANEL WITH GFR
ALT: 21 U/L (ref 0–44)
AST: 16 U/L (ref 15–41)
Albumin: 4.5 g/dL (ref 3.5–5.0)
Alkaline Phosphatase: 82 U/L (ref 38–126)
Anion gap: 11 (ref 5–15)
BUN: 11 mg/dL (ref 6–20)
CO2: 24 mmol/L (ref 22–32)
Calcium: 10.1 mg/dL (ref 8.9–10.3)
Chloride: 103 mmol/L (ref 98–111)
Creatinine, Ser: 0.82 mg/dL (ref 0.44–1.00)
GFR, Estimated: >60 mL/min
Glucose, Bld: 96 mg/dL (ref 70–99)
Potassium: 4.3 mmol/L (ref 3.5–5.1)
Sodium: 138 mmol/L (ref 135–145)
Total Bilirubin: 0.3 mg/dL (ref 0.0–1.2)
Total Protein: 7.9 g/dL (ref 6.5–8.1)

## 2024-08-31 LAB — RESP PANEL BY RT-PCR (RSV, FLU A&B, COVID)  RVPGX2
Influenza A by PCR: NEGATIVE
Influenza B by PCR: NEGATIVE
Resp Syncytial Virus by PCR: NEGATIVE
SARS Coronavirus 2 by RT PCR: NEGATIVE

## 2024-08-31 LAB — TROPONIN T, HIGH SENSITIVITY: Troponin T High Sensitivity: <15 ng/L (ref 0–19)

## 2024-08-31 MED ORDER — IOHEXOL 350 MG/ML SOLN
75.0000 mL | Freq: Once | INTRAVENOUS | Status: AC | PRN
  Administered 2024-08-31: 75 mL via INTRAVENOUS

## 2024-08-31 MED ORDER — FLUCONAZOLE 150 MG PO TABS
150.0000 mg | ORAL_TABLET | Freq: Once | ORAL | Status: DC
  Filled 2024-08-31: qty 1

## 2024-08-31 MED ORDER — OXYCODONE HCL 5 MG PO TABS
5.0000 mg | ORAL_TABLET | Freq: Once | ORAL | Status: AC
  Administered 2024-08-31: 5 mg via ORAL
  Filled 2024-08-31: qty 1

## 2024-08-31 NOTE — ED Provider Triage Note (Signed)
 Emergency Medicine Provider Triage Evaluation Note  Amy Reed , a 38 y.o. female  was evaluated in triage.  Pt complains of pelvic pain.  8 days postop following hysterectomy.  Having vaginal pressure, itching. Small amount of drainage. Temp to 100.1 today. Feels sob, cough, chest discomfort, dysuria, nausea.  Review of Systems  Positive: AP, fever, sob Negative: vomiting  Physical Exam  BP (!) 146/90   Pulse 88   Temp 98.7 F (37.1 C)   Resp 17   Ht 5' 8 (1.727 m)   Wt 77.1 kg   LMP 08/07/2024   SpO2 100%   BMI 25.85 kg/m  Gen:   Awake, no distress   Resp:  Normal effort  MSK:   Moves extremities without difficulty     Medical Decision Making  Medically screening exam initiated at 1:43 AM.  Appropriate orders placed.  Amy Reed was informed that the remainder of the evaluation will be completed by another provider, this initial triage assessment does not replace that evaluation, and the importance of remaining in the ED until their evaluation is complete.     Griselda Norris, MD 08/31/24 [phone]

## 2024-08-31 NOTE — ED Triage Notes (Signed)
 Recent Hysterectomy on 12/18. Yesterday began having suprapubic pains and groin pressure.   Recently finished amoxicillin  with no improvement in symptoms.

## 2024-08-31 NOTE — ED Provider Notes (Signed)
 " [place] [facility] DE[facility]MENT AT [facility] Provider Note   CSN: [id number] Arrival date & time: 08/31/24  0113     Patient presents with: Post-op Problem   Kevin BRILEY BUMGARNER is a 38 y.o. female.   The history is provided by the patient.  LEELAH HANNA is a 38 y.o. female who presents to the Emergency Department complaining of vaginal irritation.  She is status post total hysterectomy for Lynch syndrome on December 18.  She was doing well postoperatively in terms of pain management.  Today she developed vaginal pressure and increased irritation.  She is not aware of any increased vaginal drainage.  She does report temp to 100.1, this has been intermittent since her surgery.  She also reports some dysuria.  Today she developed some right sided chest pressure and shortness of breath.  She has been experiencing ongoing cough since having a URI 3 weeks ago.  She has no known medical problems aside from Lynch syndrome.  She is not on routine medications.  She did just start amoxicillin  by gynecology for urinary symptoms on Friday.      Prior to Admission medications  Medication Sig Start Date End Date Taking? Authorizing Provider  albuterol  (PROVENTIL ) (2.5 MG/3ML) 0.083% nebulizer solution Take 3 mLs (2.5 mg total) by nebulization every 6 (six) hours as needed for wheezing or shortness of breath. 01/02/24   Billy Asberry FALCON, PA-C  amoxicillin  (AMOXIL ) 500 MG capsule Take 1 capsule (500 mg total) by mouth 3 (three) times daily for 5 days. 08/29/24 09/03/24  Viktoria Comer SAUNDERS, MD  EPINEPHrine  (EPIPEN  2-PAK) 0.3 mg/0.3 mL IJ SOAJ injection Inject 0.3 mg into the muscle as needed for anaphylaxis. 01/02/24   Billy Asberry FALCON, PA-C  estradiol  (CLIMARA  - DOSED IN MG/24 HR) 0.1 mg/24hr patch Place 1 patch (0.1 mg total) onto the skin once a week. 08/21/24   Cross, Melissa D, NP  metoprolol  tartrate (LOPRESSOR ) 100 MG tablet Take 1 tablet (100 mg total) by mouth once. Take 90-120  minutes prior to scan. Hold for SBP less than 110. 08/14/24 08/14/24  Dunn, Dayna N, PA-C  oxyCODONE  (OXY IR/ROXICODONE ) 5 MG immediate release tablet Take 1 tablet (5 mg total) by mouth every 4 (four) hours as needed for severe pain (pain score 7-10). For AFTER surgery only, do not take and drive 87/81/74   Cross, Eleanor D, NP  senna-docusate (SENOKOT-S) 8.6-50 MG tablet Take 2 tablets by mouth at bedtime. For AFTER surgery, do not take if having diarrhea 08/21/24   Cross, Eleanor D, NP  Vitamin D , Ergocalciferol , (DRISDOL ) 1.25 MG (50000 UNIT) CAPS capsule Take 1 capsule (50,000 Units total) by mouth every 7 (seven) days. 08/14/24 11/06/24  Frann Mabel Mt, DO    Allergies: Shellfish allergy  and Latex    Review of Systems  All other systems reviewed and are negative.   Updated Vital Signs BP 135/83   Pulse 79   Temp 97.8 F (36.6 C) (Oral)   Resp 17   Ht 5' 8 (1.727 m)   Wt 77.1 kg   LMP 08/07/2024   SpO2 100%   BMI 25.85 kg/m   Physical Exam Vitals and nursing note reviewed.  Constitutional:      Appearance: She is well-developed.  HENT:     Head: Normocephalic and atraumatic.  Cardiovascular:     Rate and Rhythm: Normal rate and regular rhythm.  Pulmonary:     Effort: Pulmonary effort is normal. No respiratory distress.  Abdominal:  Palpations: Abdomen is soft.     Tenderness: There is no guarding or rebound.     Comments: Surgical sites are clean, dry and intact.  There is mild generalized appropriate postsurgical tenderness.  Genitourinary:    Comments: Scant white discharge on external GU exam Musculoskeletal:        General: No swelling or tenderness.  Skin:    General: Skin is warm and dry.  Neurological:     Mental Status: She is alert and oriented to person, place, and time.  Psychiatric:        Behavior: Behavior normal.     (all labs ordered are listed, but only abnormal results are displayed) Labs Reviewed  URINALYSIS, ROUTINE W REFLEX  MICROSCOPIC - Abnormal; Notable for the following components:      Result Value   Color, Urine STRAW (*)    APPearance HAZY (*)    Hgb urine dipstick SMALL (*)    Leukocytes,Ua LARGE (*)    Bacteria, UA RARE (*)    All other components within normal limits  RESP PANEL BY RT-PCR (RSV, FLU A&B, COVID)  RVPGX2  CBC WITH DIFFERENTIAL/PLATELET  COMPREHENSIVE METABOLIC PANEL WITH GFR  TROPONIN T, HIGH SENSITIVITY    EKG: EKG Interpretation Date/Time:  Sunday August 31 2024 02:08:33 EST Ventricular Rate:  84 PR Interval:  149 QRS Duration:  88 QT Interval:  357 QTC Calculation: 422 R Axis:   69  Text Interpretation: Sinus rhythm Baseline wander in lead(s) V2 V5 Confirmed by Griselda Norris [phone]) on 08/31/2024 2:59:01 AM  Radiology: DG Chest 2 View Result Date: 08/31/2024 EXAM: 2 VIEW(S) XRAY OF THE CHEST 08/31/2024 02:13:37 AM COMPARISON: None available. CLINICAL HISTORY: cough, sob, post op FINDINGS: LUNGS AND PLEURA: No focal pulmonary opacity. No pleural effusion. No pneumothorax. HEART AND MEDIASTINUM: No acute abnormality of the cardiac and mediastinal silhouettes. BONES AND SOFT TISSUES: No acute osseous abnormality. IMPRESSION: 1. No acute process. Electronically signed by: Dorethia Molt MD 08/31/2024 02:43 AM EST RP Workstation: [id number]     Procedures   Medications Ordered in the ED  oxyCODONE  (Oxy IR/ROXICODONE ) immediate release tablet 5 mg (5 mg Oral Given 08/31/24 0542)                                    Medical Decision Making Amount and/or Complexity of Data Reviewed Labs: ordered. Radiology: ordered.  Risk Prescription drug management.   Patient 10 days postop following total hysterectomy here for evaluation of vaginal pressure, dysuria as well as right-sided chest pain, reports temperature to 100.1 at home.  She does have scant discharge on examination, no evidence of local postoperative infection.  She has appropriate abdominal tenderness.  Labs are  reassuring.  UA is not consistent with UTI at this time, recommend she continue her current antibiotics and we will send cultures today.  Will give 1 dose of Diflucan  in the event she is developing a very early yeast infection.  In terms of her chest pain, chest x-ray is negative for acute abnormality, given her inactivity and recent surgery will obtain a CTA to rule out PE.  Patient care transferred pending CTA.  Discussed that patient with Dr. Karilyn with gynecology oncology, plan to have her evaluated in the clinic tomorrow.     Final diagnoses:  None    ED Discharge Orders     None          Griselda,  Almarie, MD 08/31/24 [phone]  "

## 2024-08-31 NOTE — ED Provider Notes (Signed)
 Patient signed out to me at 0700 by Dr. Griselda pending CT read.  In short this is a 38 year old female presenting to the emergency department with vaginal irritation as well as low-grade temp with chest pain and shortness of breath after recent hysterectomy.  The patient's been hemodynamically stable here in no acute distress.  Her surgical site appeared to be well-healing with a small amount of white discharge on external GU exam.  She was given a single dose of Diflucan .  Patient's labs are within normal range with no signs of sepsis.  She had CT PE study performed that was negative.  Previous provider spoke with her on-call GYN who is scheduling follow-up for her tomorrow in the office.  The patient was given strict return precautions.   Kingsley, Konnar Ben K, DO 08/31/24 [phone]

## 2024-08-31 NOTE — Discharge Instructions (Signed)
 You were seen in the emergency department for your vaginal irritation as well as her chest pain and shortness of breath.  Your workup showed no signs of heart attack, blood clots or pneumonia and no obvious signs of any postop infections.  You may have a yeast infection and we gave you a dose of Diflucan  to treat this.  You should follow-up with your GYN in the office tomorrow to have your symptoms rechecked.  You should return to the emergency department for significantly worsening pain, high fevers, pus drainage or any other new or concerning symptoms.

## 2024-09-01 ENCOUNTER — Telehealth: Payer: Self-pay | Admitting: *Deleted

## 2024-09-01 ENCOUNTER — Other Ambulatory Visit: Payer: Self-pay | Admitting: Oncology

## 2024-09-01 ENCOUNTER — Encounter: Payer: Self-pay | Admitting: Gynecologic Oncology

## 2024-09-01 ENCOUNTER — Other Ambulatory Visit: Payer: Self-pay | Admitting: *Deleted

## 2024-09-01 DIAGNOSIS — N390 Urinary tract infection, site not specified: Secondary | ICD-10-CM

## 2024-09-01 MED ORDER — SULFAMETHOXAZOLE-TRIMETHOPRIM 800-160 MG PO TABS: 1.0000 | ORAL_TABLET | Freq: Two times a day (BID) | ORAL | 0 refills | Status: AC

## 2024-09-01 NOTE — Telephone Encounter (Signed)
 Spoke with patient and relayed message from provider to take the diflucan  today. Advised patient that her urine sample from yesterday will be sent for urine culture. Offered patient an appointment tomorrow with provider to be evaluated and patient agreed. Appointment scheduled for 12/30 at 1:30 pm.

## 2024-09-01 NOTE — Telephone Encounter (Signed)
-----   Message from Comer Dollar, MD sent at 09/01/2024  6:58 AM EST ----- E[place], Please call this patient - she was seen in the ED over the weekend with vagina discharge and low grade temperature at home. She was given diflucan  for yeast infection (I also had sent in antibiotics for UTI last week). Could you see how she is feeling? If she needs to be seen, let's try to get her in Monday or Tuesday. Thanks, kat

## 2024-09-01 NOTE — Telephone Encounter (Signed)
 Spoke with Amy Reed who states she has one more day on her amoxicillin . Patient is still experiencing lower pelvic pressure and discomfort when she urinates. Denies fever today and states she is having a clear vaginal discharge that is sticky today with itching and reports no vaginal pain or bleeding. Pt is following all post surgery activity restrictions. Pt states the ED doctor told her not to take the diflucan  until she completes her antibiotic.   Advised patient that her message will be relayed to provider and the office would call back with recommendations.

## 2024-09-02 ENCOUNTER — Encounter: Payer: Self-pay | Admitting: Gynecologic Oncology

## 2024-09-02 ENCOUNTER — Ambulatory Visit (HOSPITAL_COMMUNITY)

## 2024-09-02 ENCOUNTER — Inpatient Hospital Stay: Admitting: Gynecologic Oncology

## 2024-09-02 VITALS — BP 119/78 | HR 92 | Temp 98.2°F | Resp 18 | Ht 68.0 in | Wt 163.0 lb

## 2024-09-02 DIAGNOSIS — Z90722 Acquired absence of ovaries, bilateral: Secondary | ICD-10-CM

## 2024-09-02 DIAGNOSIS — R300 Dysuria: Secondary | ICD-10-CM

## 2024-09-02 DIAGNOSIS — N390 Urinary tract infection, site not specified: Secondary | ICD-10-CM

## 2024-09-02 DIAGNOSIS — Z1501 Genetic susceptibility to malignant neoplasm of breast: Secondary | ICD-10-CM

## 2024-09-02 DIAGNOSIS — Z90710 Acquired absence of both cervix and uterus: Secondary | ICD-10-CM

## 2024-09-02 DIAGNOSIS — Z1509 Genetic susceptibility to other malignant neoplasm: Secondary | ICD-10-CM

## 2024-09-02 DIAGNOSIS — B379 Candidiasis, unspecified: Secondary | ICD-10-CM

## 2024-09-02 MED ORDER — FLUCONAZOLE 150 MG PO TABS: 150.0000 mg | ORAL_TABLET | Freq: Every day | ORAL | 1 refills | Status: DC

## 2024-09-02 NOTE — Progress Notes (Signed)
 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  Reports multiple family members with cancer diagnoses in their 13s.   07/18/24: EMB shows endometrium with glandular and stromal breakdown, no atypia or EIN.  Pelvic ultrasound 07/23/24: Echogenic endometrial lesion likely representing an endometrial polyp. Intracavitary/submucosal fibroid is in differential diagnosis. Malignancy can not be excluded in this high-risk patient.  Recommend close attention on follow-up exam. Subserosal hypoechoic uterine fibroid in anterior body (FIGO 5).  08/21/24: Robotic-assisted laparoscopic total hysterectomy with bilateral salpingo-oophorectomy  A. UTERUS, CERVIX, FALLOPIAN TUBE, OVARY, BILATERAL, HYSTERECTOMY:  - Endocervical polyp measuring 1.8 cm.  - Endometrial polyp measuring 0.6 cm.  - Proliferative endometrium.  - Myometrium with leiomyoma measuring 1.5 cm.  - Bilateral fallopian tubes with fimbriated end.  - Bilateral ovaries with cystic follicles.  - Negative for atypia/EIN and malignancy.   Interval History: Continues to have some pelvic pressure. Having significant pain when she urinates, makes her not want to go to the bathroom. Reports this pain is on the inside not the outside. Having pink on toilet paper when she wipes, denies any bleeding. Having some clear discharge since surgery. Still having vaginal itching. Incisions feeling ok, some discomfort with palpation around her umbilical incision. Tolerating PO intake without issues, reports good bowel function.   Past Medical/Surgical History: Past Medical History:  Diagnosis Date   Allergy     Asthma    Family history of colon cancer    GERD (gastroesophageal reflux disease)    Headache    d/t allergies   Lynch syndrome     Past Surgical History:  Procedure  Laterality Date   60 HOUR PH STUDY N/A 10/10/2017   Procedure: 24 HOUR PH STUDY-OFF of PPI;  Surgeon: Shila Gustav GAILS, MD;  Location: [facility];  Service: Endoscopy;  Laterality: N/A;   DILATION AND CURETTAGE OF UTERUS     ESOPHAGEAL MANOMETRY N/A 10/10/2017   Procedure: ESOPHAGEAL MANOMETRY (EM);  Surgeon: Shila Gustav GAILS, MD;  Location: [facility];  Service: Endoscopy;  Laterality: N/A;   PH IMPEDANCE STUDY N/A 10/10/2017   Procedure: PH IMPEDANCE STUDY-OFF of PPI;  Surgeon: Shila Gustav GAILS, MD;  Location: [facility];  Service: Endoscopy;  Laterality: N/A;   ROBOTIC ASSISTED TOTAL HYSTERECTOMY WITH BILATERAL SALPINGO OOPHERECTOMY Bilateral 08/21/2024   Procedure: HYSTERECTOMY, TOTAL, ROBOT-ASSISTED, LAPAROSCOPIC, WITH BILATERAL SALPINGO-OOPHORECTOMY;  Surgeon: Viktoria Comer SAUNDERS, MD;  Location: [facility];  Service: Gynecology;  Laterality: Bilateral;    Family History  Problem Relation Age of Onset   Cervical cancer Mother    Colon cancer Father 71   Prostate cancer Father    Colon polyps Sister    Cervical polyp Sister    Skin cancer Sister    Colon polyps Brother    Diabetes Maternal Grandmother    Diabetes Maternal Grandfather    Breast cancer Paternal Grandmother    Diabetes Paternal Grandmother    Cervical cancer Paternal Grandmother    Diabetes Paternal Grandfather    Colon cancer Paternal Grandfather    Colon cancer Paternal Aunt    Breast cancer Paternal Aunt    Colon cancer Paternal Aunt    Valvular heart disease Other        Multiple family members   Esophageal cancer Neg Hx    Stomach cancer Neg Hx    Rectal cancer Neg Hx  Sleep apnea Neg Hx     Social History   Socioeconomic History   Marital status: Married    Spouse name: Not on file   Number of children: Not on file   Years of education: Not on file   Highest education level: Some college, no degree  Occupational History   Not on file  Tobacco Use   Smoking status: Never   Smokeless  tobacco: Never  Vaping Use   Vaping status: Never Used  Substance and Sexual Activity   Alcohol use: Yes    Alcohol/week: 5.0 standard drinks of alcohol    Types: 5 Standard drinks or equivalent per week    Comment: 3-4x a week   Drug use: No   Sexual activity: Yes    Partners: Female    Birth control/protection: None  Other Topics Concern   Not on file  Social History Narrative   1 cup of caffeine daily    Social Drivers of Health   Tobacco Use: Low Risk (08/31/2024)   Patient History    Smoking Tobacco Use: Never    Smokeless Tobacco Use: Never    Passive Exposure: Not on file  Recent Concern: Tobacco Use - High Risk (08/06/2024)   Patient History    Smoking Tobacco Use: Some Days    Smokeless Tobacco Use: Never    Passive Exposure: Not on file  Financial Resource Strain: Patient Declined (08/05/2024)   Overall Financial Resource Strain (CARDIA)    Difficulty of Paying Living Expenses: Patient declined  Food Insecurity: No Food Insecurity (08/21/2024)   Epic    Worried About Programme Researcher, Broadcasting/film/video in the Last Year: Never true    Ran Out of Food in the Last Year: Never true  Transportation Needs: No Transportation Needs (08/21/2024)   Epic    Lack of Transportation (Medical): No    Lack of Transportation (Non-Medical): No  Physical Activity: Insufficiently Active (08/05/2024)   Exercise Vital Sign    Days of Exercise per Week: 2 days    Minutes of Exercise per Session: 30 min  Stress: No Stress Concern Present (08/05/2024)   [employer] of Occupational Health - Occupational Stress Questionnaire    Feeling of Stress: Only a little  Social Connections: Unknown (08/05/2024)   Social Connection and Isolation Panel    Frequency of Communication with Friends and Family: More than three times a week    Frequency of Social Gatherings with Friends and Family: Once a week    Attends Religious Services: Patient declined    Active Member of Clubs or Organizations: Patient  declined    Attends Banker Meetings: Not on file    Marital Status: Patient declined  Depression (PHQ2-9): Low Risk (08/06/2024)   Depression (PHQ2-9)    PHQ-2 Score: 0  Alcohol Screen: Medium Risk (08/05/2024)   Alcohol Screen    Last Alcohol Screening Score (AUDIT): 8  Housing: Low Risk (08/21/2024)   Epic    Unable to Pay for Housing in the Last Year: No    Number of Times Moved in the Last Year: 0    Homeless in the Last Year: No  Utilities: Not At Risk (08/21/2024)   Epic    Threatened with loss of utilities: No  Health Literacy: Not on file    Current Medications: Current Medications[1]  Review of Systems: Pertinent positives as per HPI.  Physical Exam: BP 119/78 (BP Location: Left Arm, Patient Position: Sitting)   Pulse 92   Temp 98.2  F (36.8 C)   Resp 18   Ht 5' 8 (1.727 m)   Wt 163 lb (73.9 kg)   LMP 08/07/2024   SpO2 100%   BMI 24.78 kg/m  General: Alert, oriented, no acute distress. HEENT: Posterior oropharynx clear, sclera anicteric. Chest: Unlabored breathing on room air. Abdomen: soft, mild tenderness around supraumbilical incision, otherwise nontender.  Normoactive bowel sounds.  No masses or hepatosplenomegaly appreciated.  Well-healed incisions with Dermabond still in place. Extremities: Grossly normal range of motion.  Warm, well perfused.  No edema bilaterally. GU: Normal appearing external genitalia without erythema, excoriation, or lesions.  Speculum exam reveals minimal blood-tinged discharge within the apex of the vault.  Cuff is intact without active bleeding.  Cuff treated with silver nitrate.  Bimanual exam, cuff is intact, no significant tenderness with palpation.  Patient does have increased pain with palpation along the anterior vagina.  Laboratory & Radiologic Studies: None new  Assessment & Plan: URIJAH Amy Reed is a 38 y.o. woman s/p total robotic hysterectomy with BSO on 12/18 in the setting of Lynch syndrome.  Pathology  benign.  Patient is overall doing well postoperatively but continues to have pelvic symptoms and dysuria concerning for bladder infection.  Urine culture last week showed 20,000 colonies/mL of strep agalactiae and the patient was treated with amoxicillin .  She was seen in the emergency department over the weekend with persistent symptoms as well as vaginal itching.  Labs were overall very reassuring.  Given some respiratory symptoms, CT angio was also performed which was negative for pulmonary embolism.  Urinalysis was again sent and surprisingly leukocyte esterase had increased since her prior urinalysis on 12/24.  Patient was given a dose of Diflucan  for treatment of candidiasis.  I had my office add on a culture yesterday which is showing growth of gram-negative rods.  I sent in Bactrim  yesterday which the patient has yet to start.  I encouraged her to start this this afternoon.  We also discussed trial of Azo, which she could either start now or on Thursday if she does not have significant improvement in her symptoms with Bactrim .  Her exam is overall very reassuring without signs of cuff cellulitis.  Patient was also given another prescription for Diflucan  as she has previously required multidose regimen to clear yeast infection.  My office will call her tomorrow to check-in.  Routine postoperative restrictions and expectations reinforced.  28 minutes of total time was spent for this patient encounter, including preparation, face-to-face counseling with the patient and coordination of care, and documentation of the encounter.  Comer Dollar, MD  Division of Gynecologic Oncology  Department of Obstetrics and Gynecology  [facility]      [1]  Current Outpatient Medications:    fluconazole  (DIFLUCAN ) 150 MG tablet, Take 1 tablet (150 mg total) by mouth daily. Take one table. 72 hours later take another. 72 hours later take the last., Disp: 3 tablet, Rfl: 1    albuterol  (PROVENTIL ) (2.5 MG/3ML) 0.083% nebulizer solution, Take 3 mLs (2.5 mg total) by nebulization every 6 (six) hours as needed for wheezing or shortness of breath., Disp: 75 mL, Rfl: 12   amoxicillin  (AMOXIL ) 500 MG capsule, Take 1 capsule (500 mg total) by mouth 3 (three) times daily for 5 days., Disp: 15 capsule, Rfl: 0   EPINEPHrine  (EPIPEN  2-PAK) 0.3 mg/0.3 mL IJ SOAJ injection, Inject 0.3 mg into the muscle as needed for anaphylaxis., Disp: 1 each, Rfl: 0   estradiol  (CLIMARA  -  DOSED IN MG/24 HR) 0.1 mg/24hr patch, Place 1 patch (0.1 mg total) onto the skin once a week., Disp: 4 patch, Rfl: 12   metoprolol  tartrate (LOPRESSOR ) 100 MG tablet, Take 1 tablet (100 mg total) by mouth once. Take 90-120 minutes prior to scan. Hold for SBP less than 110., Disp: 1 tablet, Rfl: 0   oxyCODONE  (OXY IR/ROXICODONE ) 5 MG immediate release tablet, Take 1 tablet (5 mg total) by mouth every 4 (four) hours as needed for severe pain (pain score 7-10). For AFTER surgery only, do not take and drive, Disp: 15 tablet, Rfl: 0   senna-docusate (SENOKOT-S) 8.6-50 MG tablet, Take 2 tablets by mouth at bedtime. For AFTER surgery, do not take if having diarrhea, Disp: 30 tablet, Rfl: 0   sulfamethoxazole -trimethoprim  (BACTRIM  DS) 800-160 MG tablet, Take 1 tablet by mouth 2 (two) times daily for 3 days., Disp: 6 tablet, Rfl: 0   Vitamin D , Ergocalciferol , (DRISDOL ) 1.25 MG (50000 UNIT) CAPS capsule, Take 1 capsule (50,000 Units total) by mouth every 7 (seven) days., Disp: 12 capsule, Rfl: 0

## 2024-09-02 NOTE — Patient Instructions (Signed)
 It was good to see you. Please start the Bactrim  today. If you don't have improvement in your urinary symptoms, start Azo (over the counter) on Thursday.  Please pick up Azo from the pharmacy to have at home. I've sent Diflucan  for you to take once you finish the antibiotics to treat a yeast infection.  I treated the top of your vagina (where the stitches are) with a little medication to help prevent additional spotting.

## 2024-09-03 ENCOUNTER — Telehealth: Payer: Self-pay | Admitting: *Deleted

## 2024-09-03 ENCOUNTER — Ambulatory Visit: Payer: Self-pay | Admitting: Gynecologic Oncology

## 2024-09-03 LAB — URINE CULTURE: Culture: 20000 — AB

## 2024-09-03 NOTE — Telephone Encounter (Signed)
 Spoke with patient who states her urinary symptoms are slightly better and pain is improving. Advised patient to continue taking Bactrim , and continue to follow all post op activity restrictions and call the office with any new symptoms or symptoms that fail to improve. Pt verbalized understanding and thanked the office for calling.

## 2024-09-05 ENCOUNTER — Ambulatory Visit
Admission: RE | Admit: 2024-09-05 | Discharge: 2024-09-05 | Disposition: A | Discharge status: Home / Self Care | Source: Ambulatory Visit | Attending: Family Medicine | Admitting: Family Medicine

## 2024-09-05 ENCOUNTER — Other Ambulatory Visit: Payer: Self-pay | Admitting: Gynecologic Oncology

## 2024-09-05 ENCOUNTER — Telehealth: Payer: Self-pay | Admitting: *Deleted

## 2024-09-05 DIAGNOSIS — R928 Other abnormal and inconclusive findings on diagnostic imaging of breast: Secondary | ICD-10-CM

## 2024-09-05 DIAGNOSIS — N760 Acute vaginitis: Secondary | ICD-10-CM

## 2024-09-05 MED ORDER — METRONIDAZOLE 500 MG PO TABS: 500.0000 mg | ORAL_TABLET | Freq: Two times a day (BID) | ORAL | 0 refills | Status: AC

## 2024-09-05 NOTE — Telephone Encounter (Signed)
 Spoke with patient who states she has a fishy vaginal odor and believes she has bacterial vaginosis. Pt finished her Bactrim  and her urinary symptoms have improved. Pt denies fever, chills or pain and reports a pink minimal vaginal discharge. Pt also states she gets yeast and BV often. Pt took the dose of diflucan  several days ago. Advised patient her message will be relayed to providers and the office will call back with recommendations. Pt thanked the office for calling.

## 2024-09-05 NOTE — Progress Notes (Signed)
 See patient message. Per Dr. Viktoria, plan to prescribe 7 days worth of Flagyl  to treat BV symptoms.

## 2024-09-05 NOTE — Telephone Encounter (Signed)
 Spoke with patient and relayed message from provider that a Rx for Flagyl  has been sent to pharmacy and patient is to avoid alcohol use while taking antibiotic. Pt verbalized understanding and thanked the office for calling.

## 2024-09-08 ENCOUNTER — Ambulatory Visit: Admitting: Family Medicine

## 2024-09-08 ENCOUNTER — Encounter: Payer: Self-pay | Admitting: Family Medicine

## 2024-09-08 VITALS — BP 122/76 | HR 72 | Temp 97.8°F | Resp 16 | Ht 68.0 in | Wt 165.4 lb

## 2024-09-08 DIAGNOSIS — M278 Other specified diseases of jaws: Secondary | ICD-10-CM | POA: Diagnosis not present

## 2024-09-08 DIAGNOSIS — R0789 Other chest pain: Secondary | ICD-10-CM | POA: Diagnosis not present

## 2024-09-08 DIAGNOSIS — M79605 Pain in left leg: Secondary | ICD-10-CM | POA: Diagnosis not present

## 2024-09-08 MED ORDER — SENNOSIDES-DOCUSATE SODIUM 8.6-50 MG PO TABS: 2.0000 | ORAL_TABLET | Freq: Every day | ORAL | 0 refills | Status: AC

## 2024-09-08 NOTE — Patient Instructions (Signed)
 Heat (pad or rice pillow in microwave) over affected area, 10-15 minutes twice daily.   Ice/cold pack over area for 10-15 min twice daily.  OK to take Tylenol  1000 mg (2 extra strength tabs) or 975 mg (3 regular strength tabs) every 6 hours as needed.  Ibuprofen  400-600 mg (2-3 over the counter strength tabs) every 6 hours as needed for pain.  OK to take Tylenol  1000 mg (2 extra strength tabs) or 975 mg (3 regular strength tabs) every 6 hours as needed.  Please get your X-ray done at the [facility] in [place]: [address] [place], [place] [address] [phone]  You do not need an appointment for this location.   Let us  know if you need anything.  Pectoralis Major Rehab Ask your health care provider which exercises are safe for you. Do exercises exactly as told by your health care provider and adjust them as directed. It is normal to feel mild stretching, pulling, tightness, or discomfort as you do these exercises, but you should stop right away if you feel sudden pain or your pain gets worse. Do not begin these exercises until told by your health care provider. Stretching and range of motion exercises These exercises warm up your muscles and joints and improve the movement and flexibility of your shoulder. These exercises can also help to relieve pain, numbness, and tingling. Exercise A: Pendulum  Stand near a wall or a surface that you can hold onto for balance. Bend at the waist and let your left / right arm hang straight down. Use your other arm to keep your balance. Relax your arm and shoulder muscles, and move your hips and your trunk so your left / right arm swings freely. Your arm should swing because of the motion of your body, not because you are using your arm or shoulder muscles. Keep moving so your arm swings in the following directions, as told by your health care provider: Side to side. Forward and backward. In clockwise and counterclockwise circles. Slowly  return to the starting position. Repeat 2 times. Complete this exercise 3 times per week. Exercise B: Abduction, standing Stand and hold a broomstick, a cane, or a similar object. Place your hands a little more than shoulder-width apart on the object. Your left / right hand should be palm-up, and your other hand should be palm-down. While keeping your elbow straight and your shoulder muscles relaxed, push the stick across your body toward your left / right side. Raise your left / right arm to the side of your body and then over your head until you feel a stretch in your shoulder. Stop when you reach the angle that is recommended by your health care provider. Avoid shrugging your shoulder while you raise your arm. Keep your shoulder blade tucked down toward the middle of your spine. Hold for 10 seconds. Slowly return to the starting position. Repeat 2 times. Complete this exercise 3 times per week. Exercise C: Wand flexion, supine  Lie on your back. You may bend your knees for comfort. Hold a broomstick, a cane, or a similar object so that your hands are about shoulder-width apart on the object. Your palms should face toward your feet. Raise your left / right arm in front of your face, then behind your head (toward the floor). Use your other hand to help you do this. Stop when you feel a gentle stretch in your shoulder, or when you reach the angle that is recommended by your health  care provider. Hold for 3 seconds. Use the broomstick and your other arm to help you return your left / right arm to the starting position. Repeat 2 times. Complete this exercise 3 times per week. Exercise D: Wand shoulder external rotation Stand and hold a broomstick, a cane, or a similar object so your hands are about shoulder-width apart on the object. Start with your arms hanging down, then bend both elbows to an L shape (90 degrees). Keep your left / right elbow at your side. Use your other hand to push the  stick so your left / right forearm moves away from your body, out to your side. Keep your left / right elbow bent to 90 degrees and keep it against your side. Stop when you feel a gentle stretch in your shoulder, or when you reach the angle recommended by your health care provider. Hold for 10 seconds. Use the stick to help you return your left / right arm to the starting position. Repeat 2 times. Complete this exercise 3 times per week. Strengthening exercises These exercises build strength and endurance in your shoulder. Endurance is the ability to use your muscles for a long time, even after your muscles get tired. Exercise E: Scapular protraction, standing Stand so you are facing a wall. Place your feet about one arm-length away from the wall. Place your hands on the wall and straighten your elbows. Keep your hands on the wall as you push your upper back away from the wall. You should feel your shoulder blades sliding forward. Keep your elbows and your head still. If you are not sure that you are doing this exercise correctly, ask your health care provider for more instructions. Hold for 3 seconds. Slowly return to the starting position. Let your muscles relax completely before you repeat this exercise. Repeat 2 times. Complete this exercise 3 times per week. Exercise F: Shoulder blade squeezes  (scapular retraction) Sit with good posture in a stable chair. Do not let your back touch the back of the chair. Your arms should be at your sides with your elbows bent. You may rest your forearms on a pillow if that is more comfortable. Squeeze your shoulder blades together. Bring them down and back. Keep your shoulders level. Do not lift your shoulders up toward your ears. Hold for 3 seconds. Return to the starting position. Repeat 2 times. Complete this exercise 3 times per week. This information is not intended to replace advice given to you by your health care provider. Make sure you  discuss any questions you have with your health care provider. Document Released: 08/21/2005 Document Revised: 06/01/2016 Document Reviewed: 05/09/2015 Elsevier Interactive Patient Education  [employer].

## 2024-09-08 NOTE — Progress Notes (Signed)
 Chief Complaint  Patient presents with   Cough    Cough and Chest Pain    Amy Reed is a 39 y.o. female here for evaluation of chest pain.  Duration of issue: 2 days Quality: heaviness Palliation: N/A Provocation: none Severity: 5/10 Radiation: radiates laterally both sides Duration of chest pain: a few seconds Associated symptoms: none No new SOB, skin changes, jaw/arm pain.  Cardiac history: none Family heart history: no heart dz Smoker? No  Patient has a several month history of a nodule over her left jaw.  She has a history of Lynch syndrome and is very cognizant of lumps and bumps.  She feels like this 1 has gotten larger and more painful.  No recent trauma or change in activity explaining the situation.  No overlying redness, bruising, or decreased ability to chew.  Patient has more pain over her shin.  She brought it up to me before and I told her it was her tibial tubercle.  No recent injury or change in activity.  No redness, bruising, swelling, or decreased range of motion.  Walking is not an issue.  Past Medical History:  Diagnosis Date   Allergy     Asthma    Family history of colon cancer    GERD (gastroesophageal reflux disease)    Headache    d/t allergies   Lynch syndrome    Family History  Problem Relation Age of Onset   Cervical cancer Mother    Colon cancer Father 51   Prostate cancer Father    Colon polyps Sister    Cervical polyp Sister    Skin cancer Sister    Colon polyps Brother    Diabetes Maternal Grandmother    Diabetes Maternal Grandfather    Breast cancer Paternal Grandmother    Diabetes Paternal Grandmother    Cervical cancer Paternal Grandmother    Diabetes Paternal Grandfather    Colon cancer Paternal Grandfather    Colon cancer Paternal Aunt    Breast cancer Paternal Aunt    Colon cancer Paternal Aunt    Valvular heart disease Other        Multiple family members   Esophageal cancer Neg Hx    Stomach cancer Neg Hx     Rectal cancer Neg Hx    Sleep apnea Neg Hx     BP 122/76 (BP Location: Left Arm, Patient Position: Sitting)   Pulse 72   Temp 97.8 F (36.6 C) (Oral)   Resp 16   Ht 5' 8 (1.727 m)   Wt 165 lb 6.4 oz (75 kg)   LMP 08/07/2024   SpO2 98%   BMI 25.15 kg/m  Gen: awake, alert, appears stated age HEENT: PERRLA, MMM Neck: No masses or asymmetry Heart: RRR, no bruits, no LE edema Lungs: CTAB, no accessory muscle use Abd: Soft, NT, ND, no masses or organomegaly MSK: chest pain is reproducible to palpation, no calf ttp; TTP over L tibial tubercle without edema, erythema, deformity which is not present on R Skin: Over posterior L TMJ, there is an elliptically shaped and freely movable lesion measuring approximately 0.7 x 0.3 cm.  There is TTP.  It is rubbery in texture. Psych: Age appropriate judgment and insight, nml mood and affect  Atypical chest pain  Mass of jaw - Plan: US  Soft Tissue Head/Neck (NON-THYROID )  Left leg pain - Plan: DG Knee 3 Views Left  Mainly reassurance.  Heat, ice, Tylenol , stretches and exercises.  Pain is not exertional.  She  has low risk factors for a DVT/PE. Check an ultrasound of the area.  If unremarkable, we will keep an eye on things.  If abnormal, we will proceed accordingly. Ice, Tylenol , check an x-ray.  Seems like tenderness over the tibial tubercle.  Could be bursal involvement. F/u as originally scheduled. The patient voiced understanding and agreement to the plan.  I spent 30 minutes with the patient discussing the above plans in addition to reviewing her chart on the same day of the visit.  Mabel [place], DO 09/08/2024 9:47 AM

## 2024-09-10 ENCOUNTER — Ambulatory Visit

## 2024-09-10 DIAGNOSIS — M79605 Pain in left leg: Secondary | ICD-10-CM | POA: Diagnosis not present

## 2024-09-10 DIAGNOSIS — M278 Other specified diseases of jaws: Secondary | ICD-10-CM

## 2024-09-10 DIAGNOSIS — N939 Abnormal uterine and vaginal bleeding, unspecified: Secondary | ICD-10-CM

## 2024-09-10 DIAGNOSIS — Z148 Genetic carrier of other disease: Secondary | ICD-10-CM

## 2024-09-12 ENCOUNTER — Ambulatory Visit: Admitting: Family Medicine

## 2024-09-17 ENCOUNTER — Inpatient Hospital Stay: Admitting: Gynecologic Oncology

## 2024-09-18 ENCOUNTER — Ambulatory Visit: Payer: Self-pay | Admitting: Family Medicine

## 2024-09-19 ENCOUNTER — Encounter: Payer: Self-pay | Admitting: Gynecologic Oncology

## 2024-09-19 ENCOUNTER — Inpatient Hospital Stay: Attending: Gynecologic Oncology | Admitting: Gynecologic Oncology

## 2024-09-19 VITALS — BP 128/82 | HR 91 | Temp 98.0°F | Resp 19 | Wt 165.8 lb

## 2024-09-19 DIAGNOSIS — B379 Candidiasis, unspecified: Secondary | ICD-10-CM

## 2024-09-19 DIAGNOSIS — Z1507 Genetic susceptibility to malignant neoplasm of urinary tract: Secondary | ICD-10-CM

## 2024-09-19 DIAGNOSIS — B3731 Acute candidiasis of vulva and vagina: Secondary | ICD-10-CM

## 2024-09-19 DIAGNOSIS — Z15068 Genetic susceptibility to other malignant neoplasm of digestive system: Secondary | ICD-10-CM

## 2024-09-19 DIAGNOSIS — Z9079 Acquired absence of other genital organ(s): Secondary | ICD-10-CM

## 2024-09-19 DIAGNOSIS — Z15060 Genetic susceptibility to colorectal cancer: Secondary | ICD-10-CM

## 2024-09-19 DIAGNOSIS — Z90710 Acquired absence of both cervix and uterus: Secondary | ICD-10-CM

## 2024-09-19 DIAGNOSIS — Z90722 Acquired absence of ovaries, bilateral: Secondary | ICD-10-CM

## 2024-09-19 DIAGNOSIS — Z1509 Genetic susceptibility to other malignant neoplasm: Secondary | ICD-10-CM

## 2024-09-19 DIAGNOSIS — Z1504 Genetic susceptibility to malignant neoplasm of endometrium: Secondary | ICD-10-CM

## 2024-09-19 MED ORDER — FLUCONAZOLE 150 MG PO TABS: 150.0000 mg | ORAL_TABLET | Freq: Every day | ORAL | 1 refills | Status: AC

## 2024-09-19 NOTE — Progress Notes (Signed)
 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  Reports multiple family members with cancer diagnoses in their 63s.    07/18/24: EMB shows endometrium with glandular and stromal breakdown, no atypia or EIN.   Pelvic ultrasound 07/23/24: Echogenic endometrial lesion likely representing an endometrial polyp. Intracavitary/submucosal fibroid is in differential diagnosis. Malignancy can not be excluded in this high-risk patient.  Recommend close attention on follow-up exam. Subserosal hypoechoic uterine fibroid in anterior body (FIGO 5).   08/21/24: Robotic-assisted laparoscopic total hysterectomy with bilateral salpingo-oophorectomy  A. UTERUS, CERVIX, FALLOPIAN TUBE, OVARY, BILATERAL, HYSTERECTOMY:  - Endocervical polyp measuring 1.8 cm.  - Endometrial polyp measuring 0.6 cm.  - Proliferative endometrium.  - Myometrium with leiomyoma measuring 1.5 cm.  - Bilateral fallopian tubes with fimbriated end.  - Bilateral ovaries with cystic follicles.  - Negative for atypia/EIN and malignancy.   Interval History: Overall doing well.  Continues to feel better.  Still has some fatigue.  Still has some deep pelvic pain but this has improved.  Urinary symptoms have improved and are almost back to normal.  Still struggling with some constipation, picked up Dulcolax that she is going to try.  Still has some light pink discharge, denies any bleeding.  Was having hot flashes, have improved since she started hormone patch.  Past Medical/Surgical History: Past Medical History:  Diagnosis Date   Allergy     Asthma    Family history of colon cancer    GERD (gastroesophageal reflux disease)    Headache    d/t allergies   Lynch syndrome     Past Surgical History:  Procedure Laterality Date   38 HOUR PH STUDY N/A 10/10/2017   Procedure:  24 HOUR PH STUDY-OFF of PPI;  Surgeon: Shila Gustav GAILS, MD;  Location: [facility];  Service: Endoscopy;  Laterality: N/A;   DILATION AND CURETTAGE OF UTERUS     ESOPHAGEAL MANOMETRY N/A 10/10/2017   Procedure: ESOPHAGEAL MANOMETRY (EM);  Surgeon: Shila Gustav GAILS, MD;  Location: [facility];  Service: Endoscopy;  Laterality: N/A;   PH IMPEDANCE STUDY N/A 10/10/2017   Procedure: PH IMPEDANCE STUDY-OFF of PPI;  Surgeon: Shila Gustav GAILS, MD;  Location: [facility];  Service: Endoscopy;  Laterality: N/A;   ROBOTIC ASSISTED TOTAL HYSTERECTOMY WITH BILATERAL SALPINGO OOPHERECTOMY Bilateral 08/21/2024   Procedure: HYSTERECTOMY, TOTAL, ROBOT-ASSISTED, LAPAROSCOPIC, WITH BILATERAL SALPINGO-OOPHORECTOMY;  Surgeon: Viktoria Comer SAUNDERS, MD;  Location: [facility];  Service: Gynecology;  Laterality: Bilateral;    Family History  Problem Relation Age of Onset   Cervical cancer Mother    Colon cancer Father 32   Prostate cancer Father    Colon polyps Sister    Cervical polyp Sister    Skin cancer Sister    Colon polyps Brother    Diabetes Maternal Grandmother    Diabetes Maternal Grandfather    Breast cancer Paternal Grandmother    Diabetes Paternal Grandmother    Cervical cancer Paternal Grandmother    Diabetes Paternal Grandfather    Colon cancer Paternal Grandfather    Colon cancer Paternal Aunt    Breast cancer Paternal Aunt    Colon cancer Paternal Aunt    Valvular heart disease Other        Multiple family members   Esophageal cancer Neg Hx    Stomach cancer Neg Hx    Rectal cancer Neg Hx  Sleep apnea Neg Hx     Social History   Socioeconomic History   Marital status: Married    Spouse name: Not on file   Number of children: Not on file   Years of education: Not on file   Highest education level: Some college, no degree  Occupational History   Not on file  Tobacco Use   Smoking status: Never   Smokeless tobacco: Never  Vaping Use   Vaping status: Never Used   Substance and Sexual Activity   Alcohol use: Yes    Alcohol/week: 5.0 standard drinks of alcohol    Types: 5 Standard drinks or equivalent per week    Comment: 3-4x a week   Drug use: No   Sexual activity: Yes    Partners: Female    Birth control/protection: None  Other Topics Concern   Not on file  Social History Narrative   1 cup of caffeine daily    Social Drivers of Health   Tobacco Use: Low Risk (09/19/2024)   Patient History    Smoking Tobacco Use: Never    Smokeless Tobacco Use: Never    Passive Exposure: Not on file  Recent Concern: Tobacco Use - High Risk (08/06/2024)   Patient History    Smoking Tobacco Use: Some Days    Smokeless Tobacco Use: Never    Passive Exposure: Not on file  Financial Resource Strain: Patient Declined (08/05/2024)   Overall Financial Resource Strain (CARDIA)    Difficulty of Paying Living Expenses: Patient declined  Food Insecurity: No Food Insecurity (08/21/2024)   Epic    Worried About Programme Researcher, Broadcasting/film/video in the Last Year: Never true    Ran Out of Food in the Last Year: Never true  Transportation Needs: No Transportation Needs (08/21/2024)   Epic    Lack of Transportation (Medical): No    Lack of Transportation (Non-Medical): No  Physical Activity: Insufficiently Active (08/05/2024)   Exercise Vital Sign    Days of Exercise per Week: 2 days    Minutes of Exercise per Session: 30 min  Stress: No Stress Concern Present (08/05/2024)   [employer] of Occupational Health - Occupational Stress Questionnaire    Feeling of Stress: Only a little  Social Connections: Unknown (08/05/2024)   Social Connection and Isolation Panel    Frequency of Communication with Friends and Family: More than three times a week    Frequency of Social Gatherings with Friends and Family: Once a week    Attends Religious Services: Patient declined    Database Administrator or Organizations: Patient declined    Attends Banker Meetings: Not on file     Marital Status: Patient declined  Depression (PHQ2-9): Low Risk (08/06/2024)   Depression (PHQ2-9)    PHQ-2 Score: 0  Alcohol Screen: Medium Risk (08/05/2024)   Alcohol Screen    Last Alcohol Screening Score (AUDIT): 8  Housing: Low Risk (08/21/2024)   Epic    Unable to Pay for Housing in the Last Year: No    Number of Times Moved in the Last Year: 0    Homeless in the Last Year: No  Utilities: Not At Risk (08/21/2024)   Epic    Threatened with loss of utilities: No  Health Literacy: Not on file    Current Medications: Current Medications[1]  Review of Systems: + Fatigue, cough, shortness of breath, chest pain, constipation, pelvic pain, hot flashes, discharge, back pain, headache Denies appetite changes, fevers, chills, unexplained weight changes.  Denies hearing loss, neck lumps or masses, mouth sores, ringing in ears or voice changes. Denies wheezing.   Denies palpitations. Denies leg swelling. Denies abdominal distention, pain, blood in stools, diarrhea, nausea, vomiting, or early satiety. Denies pain with intercourse, dysuria, frequency, hematuria or incontinence. Denies vaginal bleeding.   Denies joint pain or muscle pain/cramps. Denies itching, rash, or wounds. Denies dizziness, numbness or seizures. Denies swollen lymph nodes or glands, denies easy bruising or bleeding. Denies anxiety, depression, confusion, or decreased concentration.  Physical Exam: BP 128/82 (BP Location: Right Arm, Patient Position: Sitting)   Pulse 91   Temp 98 F (36.7 C) (Oral)   Resp 19   Wt 165 lb 12.8 oz (75.2 kg)   LMP 08/07/2024   SpO2 100%   BMI 25.21 kg/m  General: Alert, oriented, no acute distress. HEENT: Posterior oropharynx clear, sclera anicteric. Chest: Clear to auscultation bilaterally.  Unlabored breathing on room air. Cardiovascular: Regular rate and rhythm, no murmurs. Abdomen: soft, nontender.  Normoactive bowel sounds.  No masses or hepatosplenomegaly appreciated.   Well-healed incisions. Extremities: Grossly normal range of motion.  Warm, well perfused.  No edema bilaterally. GU: Normal appearing external genitalia without erythema, excoriation, or lesions.  Speculum exam reveals some white, chunky discharge.  No bleeding.  Cuff is intact.  Bimanual exam reveals intact, some mild tenderness but no significant tenderness to palpation or fluctuance.  Laboratory & Radiologic Studies: None new  Assessment & Plan: Amy Reed is a 39 y.o. woman s/p total robotic hysterectomy with BSO on 12/18 in the setting of Lynch syndrome.  Pathology benign.   Patient doing well postoperatively, significantly improved since her last visit.  Discussed continued expectations and restrictions.  Reviewed pelvic rest.  Suspected yeast infection.  Repeat doses of Diflucan  sent to her pharmacy.  Menopausal symptoms have improved since starting estrogen patch.  We will check in with her again at her next follow-up when she has been on the patch for a little bit longer.  Pathology from surgery reviewed again with her.  She was given a copy of this report.  Patient will be seen for a visit in approximately 4-5 weeks given postoperative course.  She was advised to call with any new or worsening symptoms.  20 minutes of total time was spent for this patient encounter, including preparation, face-to-face counseling with the patient and coordination of care, and documentation of the encounter.  Comer Dollar, MD  Division of Gynecologic Oncology  Department of Obstetrics and Gynecology  [facility]      [1]  Current Outpatient Medications:    albuterol  (PROVENTIL ) (2.5 MG/3ML) 0.083% nebulizer solution, Take 3 mLs (2.5 mg total) by nebulization every 6 (six) hours as needed for wheezing or shortness of breath., Disp: 75 mL, Rfl: 12   EPINEPHrine  (EPIPEN  2-PAK) 0.3 mg/0.3 mL IJ SOAJ injection, Inject 0.3 mg into the muscle as needed for  anaphylaxis., Disp: 1 each, Rfl: 0   estradiol  (CLIMARA  - DOSED IN MG/24 HR) 0.1 mg/24hr patch, Place 1 patch (0.1 mg total) onto the skin once a week., Disp: 4 patch, Rfl: 12   metroNIDAZOLE  (FLAGYL ) 500 MG tablet, Take 1 tablet (500 mg total) by mouth 2 (two) times daily., Disp: 14 tablet, Rfl: 0   senna-docusate (SENOKOT-S) 8.6-50 MG tablet, Take 2 tablets by mouth at bedtime. For AFTER surgery, do not take if having diarrhea, Disp: 30 tablet, Rfl: 0   Vitamin D , Ergocalciferol , (DRISDOL ) 1.25 MG (50000 UNIT) CAPS capsule,  Take 1 capsule (50,000 Units total) by mouth every 7 (seven) days., Disp: 12 capsule, Rfl: 0   fluconazole  (DIFLUCAN ) 150 MG tablet, Take 1 tablet (150 mg total) by mouth daily. Take one table. 72 hours later take another. 72 hours later take the last., Disp: 3 tablet, Rfl: 1   metoprolol  tartrate (LOPRESSOR ) 100 MG tablet, Take 1 tablet (100 mg total) by mouth once. Take 90-120 minutes prior to scan. Hold for SBP less than 110., Disp: 1 tablet, Rfl: 0   oxyCODONE  (OXY IR/ROXICODONE ) 5 MG immediate release tablet, Take 1 tablet (5 mg total) by mouth every 4 (four) hours as needed for severe pain (pain score 7-10). For AFTER surgery only, do not take and drive (Patient not taking: Reported on 09/16/2024), Disp: 15 tablet, Rfl: 0

## 2024-09-19 NOTE — Patient Instructions (Addendum)
 It was good to see you today.  Please remember, nothing in the vagina for 12 weeks.  We will see you back in 4-5 weeks to ensure that you continue to heal well.  If you have any issues or things seem like they are heading in the wrong direction, please call our clinic to be seen sooner.  The initial prescription for the patch that I sent has 12 refills.  Let me know if you have any trouble picking up your refills.  I sent a new prescription for 3 doses of Diflucan .  Please let me know if you continue to have discharge after this.

## 2024-09-23 ENCOUNTER — Inpatient Hospital Stay: Admitting: Gynecologic Oncology

## 2024-09-23 ENCOUNTER — Ambulatory Visit (HOSPITAL_COMMUNITY)
Admission: RE | Admit: 2024-09-23 | Discharge: 2024-09-23 | Disposition: A | Discharge status: Home / Self Care | Source: Ambulatory Visit | Attending: Physician Assistant | Admitting: Physician Assistant

## 2024-09-23 DIAGNOSIS — R002 Palpitations: Secondary | ICD-10-CM | POA: Insufficient documentation

## 2024-09-23 DIAGNOSIS — R072 Precordial pain: Secondary | ICD-10-CM | POA: Insufficient documentation

## 2024-09-23 DIAGNOSIS — R0789 Other chest pain: Secondary | ICD-10-CM | POA: Diagnosis not present

## 2024-09-23 DIAGNOSIS — R0602 Shortness of breath: Secondary | ICD-10-CM | POA: Insufficient documentation

## 2024-09-23 DIAGNOSIS — Z01810 Encounter for preprocedural cardiovascular examination: Secondary | ICD-10-CM | POA: Diagnosis not present

## 2024-09-24 ENCOUNTER — Ambulatory Visit: Payer: Self-pay | Admitting: Physician Assistant

## 2024-09-24 ENCOUNTER — Inpatient Hospital Stay: Admitting: Gynecologic Oncology

## 2024-09-24 DIAGNOSIS — Z148 Genetic carrier of other disease: Secondary | ICD-10-CM

## 2024-09-24 DIAGNOSIS — N939 Abnormal uterine and vaginal bleeding, unspecified: Secondary | ICD-10-CM

## 2024-09-24 LAB — ECHOCARDIOGRAM COMPLETE
Area-P 1/2: 4.26 cm2
S' Lateral: 2.8 cm

## 2024-09-30 ENCOUNTER — Telehealth: Payer: Self-pay | Admitting: *Deleted

## 2024-09-30 NOTE — Telephone Encounter (Signed)
 Spoke with Ms. Bayless in regards to her MyChart message. Patient states at her last appointment with Dr. Viktoria she had prescribed another round -3 more doses of diflucan  and patient finished that 2 days ago and is still complaining of white cottage cheese like discharge, itching and burning. Pt denies fever, chills, and no urinary symptoms. Pt reports spotting dark blood discharge as well. Pt also reports putting nothing in her vagina and is following all activity restrictions. She is wearing loose fitting pants and nothing snug.   Advised patient her message will be relayed to provider and the office will call back with recommendations. Pt thanked the office for calling.

## 2024-10-01 NOTE — Telephone Encounter (Signed)
 Spoke with patient and offered appointment with Eleanor Epps, NP for Friday 1/30 or she can follow up with her gyn. Pt agreed to appointment this Friday 1/30. Pt was scheduled for 1 pm 1/30. Pt is aware to arrive by 1245 for check in.

## 2024-10-02 NOTE — Progress Notes (Unsigned)
 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  Reports multiple family members with cancer diagnoses in their 15s.    07/18/24: EMB shows endometrium with glandular and stromal breakdown, no atypia or EIN.   Pelvic ultrasound 07/23/24: Echogenic endometrial lesion likely representing an endometrial polyp. Intracavitary/submucosal fibroid is in differential diagnosis. Malignancy can not be excluded in this high-risk patient.  Recommend close attention on follow-up exam. Subserosal hypoechoic uterine fibroid in anterior body (FIGO 5).   08/21/24: Robotic-assisted laparoscopic total hysterectomy with bilateral salpingo-oophorectomy  A. UTERUS, CERVIX, FALLOPIAN TUBE, OVARY, BILATERAL, HYSTERECTOMY:  - Endocervical polyp measuring 1.8 cm.  - Endometrial polyp measuring 0.6 cm.  - Proliferative endometrium.  - Myometrium with leiomyoma measuring 1.5 cm.  - Bilateral fallopian tubes with fimbriated end.  - Bilateral ovaries with cystic follicles.  - Negative for atypia/EIN and malignancy.   She was treated for a UTI with Bactrim  starting December 30.  At this time she was also given Diflucan  to take. Flagyl  prescribed on 09/05/2024 due to BV symptoms. Diflucan  again prescribed 09/19/2024 with no improvement.  Interval History: Patient has completed treatment for BV and yeast with no change in symptoms after treatment. This has been a persistent issue for her. Overall, post-op she is doing well. Tolerating diet. Abdominal incisions are healing well with an occasional twinge of pain in the umbilicus that results. Bowels and bladder functioning well. Asking about when she can drive and increase activity including work. She has been constipated for the past two days and is going to plan on using a  laxative. Nothing has been placed in the vagina. Does have cramping pelvic pains and aching intermittently. Has been using the estrogen patch and denies having hot flashes.   Past Medical/Surgical History: Past Medical History:  Diagnosis Date   Allergy     Asthma    Family history of colon cancer    GERD (gastroesophageal reflux disease)    Headache    d/t allergies   Lynch syndrome     Past Surgical History:  Procedure Laterality Date   15 HOUR PH STUDY N/A 10/10/2017   Procedure: 24 HOUR PH STUDY-OFF of PPI;  Surgeon: Shila Gustav GAILS, MD;  Location: [facility];  Service: Endoscopy;  Laterality: N/A;   DILATION AND CURETTAGE OF UTERUS     ESOPHAGEAL MANOMETRY N/A 10/10/2017   Procedure: ESOPHAGEAL MANOMETRY (EM);  Surgeon: Shila Gustav GAILS, MD;  Location: [facility];  Service: Endoscopy;  Laterality: N/A;   PH IMPEDANCE STUDY N/A 10/10/2017   Procedure: PH IMPEDANCE STUDY-OFF of PPI;  Surgeon: Shila Gustav GAILS, MD;  Location: [facility];  Service: Endoscopy;  Laterality: N/A;   ROBOTIC ASSISTED TOTAL HYSTERECTOMY WITH BILATERAL SALPINGO OOPHERECTOMY Bilateral 08/21/2024   Procedure: HYSTERECTOMY, TOTAL, ROBOT-ASSISTED, LAPAROSCOPIC, WITH BILATERAL SALPINGO-OOPHORECTOMY;  Surgeon: Viktoria Comer SAUNDERS, MD;  Location: [facility];  Service: Gynecology;  Laterality: Bilateral;    Family History  Problem Relation Age of Onset   Cervical cancer Mother    Colon cancer Father 62   Prostate cancer Father    Colon polyps Sister    Cervical polyp Sister    Skin cancer Sister    Colon polyps Brother    Diabetes Maternal Grandmother    Diabetes Maternal Grandfather    Breast cancer Paternal Grandmother    Diabetes Paternal Grandmother  Cervical cancer Paternal Grandmother    Diabetes Paternal Grandfather    Colon cancer Paternal Grandfather    Colon cancer Paternal Aunt    Breast cancer Paternal Aunt    Colon cancer Paternal Aunt    Valvular heart disease Other         Multiple family members   Esophageal cancer Neg Hx    Stomach cancer Neg Hx    Rectal cancer Neg Hx    Sleep apnea Neg Hx     Social History   Socioeconomic History   Marital status: Married    Spouse name: Not on file   Number of children: Not on file   Years of education: Not on file   Highest education level: Some college, no degree  Occupational History   Not on file  Tobacco Use   Smoking status: Never   Smokeless tobacco: Never  Vaping Use   Vaping status: Never Used  Substance and Sexual Activity   Alcohol use: Yes    Alcohol/week: 5.0 standard drinks of alcohol    Types: 5 Standard drinks or equivalent per week    Comment: 3-4x a week   Drug use: No   Sexual activity: Yes    Partners: Female    Birth control/protection: None  Other Topics Concern   Not on file  Social History Narrative   1 cup of caffeine daily    Social Drivers of Health   Tobacco Use: Low Risk (09/19/2024)   Patient History    Smoking Tobacco Use: Never    Smokeless Tobacco Use: Never    Passive Exposure: Not on file  Recent Concern: Tobacco Use - High Risk (08/06/2024)   Patient History    Smoking Tobacco Use: Some Days    Smokeless Tobacco Use: Never    Passive Exposure: Not on file  Financial Resource Strain: Patient Declined (08/05/2024)   Overall Financial Resource Strain (CARDIA)    Difficulty of Paying Living Expenses: Patient declined  Food Insecurity: No Food Insecurity (08/21/2024)   Epic    Worried About Programme Researcher, Broadcasting/film/video in the Last Year: Never true    Ran Out of Food in the Last Year: Never true  Transportation Needs: No Transportation Needs (08/21/2024)   Epic    Lack of Transportation (Medical): No    Lack of Transportation (Non-Medical): No  Physical Activity: Insufficiently Active (08/05/2024)   Exercise Vital Sign    Days of Exercise per Week: 2 days    Minutes of Exercise per Session: 30 min  Stress: No Stress Concern Present (08/05/2024)   [employer] of  Occupational Health - Occupational Stress Questionnaire    Feeling of Stress: Only a little  Social Connections: Unknown (08/05/2024)   Social Connection and Isolation Panel    Frequency of Communication with Friends and Family: More than three times a week    Frequency of Social Gatherings with Friends and Family: Once a week    Attends Religious Services: Patient declined    Active Member of Clubs or Organizations: Patient declined    Attends Banker Meetings: Not on file    Marital Status: Patient declined  Depression (PHQ2-9): Low Risk (08/06/2024)   Depression (PHQ2-9)    PHQ-2 Score: 0  Alcohol Screen: Medium Risk (08/05/2024)   Alcohol Screen    Last Alcohol Screening Score (AUDIT): 8  Housing: Low Risk (08/21/2024)   Epic    Unable to Pay for Housing in the Last Year: No  Number of Times Moved in the Last Year: 0    Homeless in the Last Year: No  Utilities: Not At Risk (08/21/2024)   Epic    Threatened with loss of utilities: No  Health Literacy: Not on file    Current Medications: Current Medications[1]  Review of Systems: See interval.  Physical Exam: LMP 08/07/2024  General: Alert, oriented, no acute distress. HEENT: Posterior oropharynx clear, sclera anicteric. Chest: Clear to auscultation bilaterally.  Unlabored breathing on room air. Cardiovascular: Regular rate and rhythm, no murmurs. Abdomen: soft, nontender.  Normoactive bowel sounds.  No masses or hepatosplenomegaly appreciated.  Well-healed incisions. Extremities: Grossly normal range of motion.  Warm, well perfused.  No edema bilaterally. GU: Normal appearing external genitalia without erythema, excoriation, or lesions. Small amount of white discharge noted at the introitus. Speculum exam reveals some white/light yellow, chunky discharge, some adherent to the vaginal walls more at the sides of the cuff.  No bleeding.  Cuff is intact.  Bimanual exam reveals intact, some mild tenderness but no  significant tenderness to palpation or fluctuance.  Laboratory & Radiologic Studies: None new  Assessment & Plan: Amy Reed is a 39 y.o. woman s/p total robotic hysterectomy with BSO on 12/18 in the setting of Lynch syndrome.  Pathology benign.   Patient doing overall well postoperatively but continues to have persistent yeast/BV symptoms after treatment. Today, an aptima swab and fungal culture of the vagina obtained. She will be contacted with the results and recommendations moving forward. She was advised to call with any new or worsening symptoms in between that time.  This visit was completely separate from the original post-operative diagnosis of lynch syndrome.   20 minutes of total time was spent for this patient encounter, including preparation, face-to-face counseling with the patient and coordination of care, and documentation of the encounter.  Eleanor Epps NP [facility]     [1]  Current Outpatient Medications:    albuterol  (PROVENTIL ) (2.5 MG/3ML) 0.083% nebulizer solution, Take 3 mLs (2.5 mg total) by nebulization every 6 (six) hours as needed for wheezing or shortness of breath., Disp: 75 mL, Rfl: 12   EPINEPHrine  (EPIPEN  2-PAK) 0.3 mg/0.3 mL IJ SOAJ injection, Inject 0.3 mg into the muscle as needed for anaphylaxis., Disp: 1 each, Rfl: 0   estradiol  (CLIMARA  - DOSED IN MG/24 HR) 0.1 mg/24hr patch, Place 1 patch (0.1 mg total) onto the skin once a week., Disp: 4 patch, Rfl: 12   fluconazole  (DIFLUCAN ) 150 MG tablet, Take 1 tablet (150 mg total) by mouth daily. Take one table. 72 hours later take another. 72 hours later take the last., Disp: 3 tablet, Rfl: 1   metoprolol  tartrate (LOPRESSOR ) 100 MG tablet, Take 1 tablet (100 mg total) by mouth once. Take 90-120 minutes prior to scan. Hold for SBP less than 110., Disp: 1 tablet, Rfl: 0   metroNIDAZOLE  (FLAGYL ) 500 MG tablet, Take 1 tablet (500 mg total) by mouth 2 (two) times daily., Disp: 14 tablet, Rfl:  0   oxyCODONE  (OXY IR/ROXICODONE ) 5 MG immediate release tablet, Take 1 tablet (5 mg total) by mouth every 4 (four) hours as needed for severe pain (pain score 7-10). For AFTER surgery only, do not take and drive (Patient not taking: Reported on 09/16/2024), Disp: 15 tablet, Rfl: 0   senna-docusate (SENOKOT-S) 8.6-50 MG tablet, Take 2 tablets by mouth at bedtime. For AFTER surgery, do not take if having diarrhea, Disp: 30 tablet, Rfl: 0   Vitamin D ,  Ergocalciferol , (DRISDOL ) 1.25 MG (50000 UNIT) CAPS capsule, Take 1 capsule (50,000 Units total) by mouth every 7 (seven) days., Disp: 12 capsule, Rfl: 0

## 2024-10-03 ENCOUNTER — Other Ambulatory Visit: Payer: Self-pay | Admitting: Gynecologic Oncology

## 2024-10-03 ENCOUNTER — Other Ambulatory Visit (HOSPITAL_COMMUNITY)
Admission: RE | Admit: 2024-10-03 | Discharge: 2024-10-03 | Disposition: A | Discharge status: Home / Self Care | Source: Ambulatory Visit | Attending: Gynecologic Oncology | Admitting: Gynecologic Oncology

## 2024-10-03 ENCOUNTER — Inpatient Hospital Stay: Admitting: Gynecologic Oncology

## 2024-10-03 VITALS — BP 124/66 | HR 90 | Temp 98.4°F | Resp 19 | Wt 160.0 lb

## 2024-10-03 DIAGNOSIS — N898 Other specified noninflammatory disorders of vagina: Secondary | ICD-10-CM | POA: Insufficient documentation

## 2024-10-03 DIAGNOSIS — N939 Abnormal uterine and vaginal bleeding, unspecified: Secondary | ICD-10-CM

## 2024-10-03 DIAGNOSIS — B379 Candidiasis, unspecified: Secondary | ICD-10-CM

## 2024-10-03 DIAGNOSIS — B3731 Acute candidiasis of vulva and vagina: Secondary | ICD-10-CM

## 2024-10-03 MED ORDER — TERCONAZOLE 0.4 % VA CREA: TOPICAL_CREAM | VAGINAL | 0 refills | Status: AC

## 2024-10-03 NOTE — Patient Instructions (Signed)
 Today we took samples from the discharge in the vagina. We will contact you with the results.  Continue with your restrictions of no strenuous activity for the full six weeks and nothing in the vagina for 12 weeks after.   Please call the office for any changes or needs.

## 2024-10-06 LAB — CULTURE, FUNGUS WITHOUT SMEAR

## 2024-10-06 LAB — CERVICOVAGINAL ANCILLARY ONLY
Bacterial Vaginitis (gardnerella): NEGATIVE
Candida Glabrata: NEGATIVE
Candida Vaginitis: POSITIVE — AB
Chlamydia: NEGATIVE
Comment: NEGATIVE
Comment: NEGATIVE
Comment: NEGATIVE
Comment: NEGATIVE
Comment: NEGATIVE
Comment: NORMAL
Neisseria Gonorrhea: NEGATIVE
Trichomonas: NEGATIVE

## 2024-10-07 ENCOUNTER — Encounter: Payer: Self-pay | Admitting: Gynecologic Oncology

## 2024-10-10 ENCOUNTER — Telehealth: Payer: Self-pay | Admitting: *Deleted

## 2024-10-10 DIAGNOSIS — B3731 Acute candidiasis of vulva and vagina: Secondary | ICD-10-CM | POA: Insufficient documentation

## 2024-10-10 NOTE — Telephone Encounter (Signed)
 Spoke with patient in regards to her MyChart message. Relayed response message from Eleanor Epps, NP: The recent aptima swab was negative for BV. Yeast culture will be processed for 21 days. If symptoms persist after completion of using the cream at bedtime for 2 weeks, then Dr. Viktoria recommends following up with GYN about this. Advised patient if we treat with antibiotics because she believes she has BV, the yeast will not be treated properly and may contribute to continued symptoms.  Patient aware she has been seen in our clinic due to lynch syndrome and verbalized understanding of above message to follow up with her GYN and once we have the results of the final yeast culture the office will call back. Pt thanked the office for calling.

## 2024-10-14 ENCOUNTER — Inpatient Hospital Stay: Admitting: Gynecologic Oncology

## 2024-10-16 ENCOUNTER — Inpatient Hospital Stay: Admitting: Gynecologic Oncology

## 2024-10-31 ENCOUNTER — Other Ambulatory Visit

## 2024-11-12 ENCOUNTER — Ambulatory Visit: Admitting: Physician Assistant
